# Patient Record
Sex: Female | Born: 1950
Health system: Southern US, Community
[De-identification: ages and names within clinical notes are randomized; demographics above are authoritative.]

## PROBLEM LIST (undated history)

## (undated) DIAGNOSIS — J309 Allergic rhinitis, unspecified: Secondary | ICD-10-CM

## (undated) DIAGNOSIS — F1210 Cannabis abuse, uncomplicated: Secondary | ICD-10-CM

## (undated) DIAGNOSIS — J069 Acute upper respiratory infection, unspecified: Secondary | ICD-10-CM

## (undated) DIAGNOSIS — K5730 Diverticulosis of large intestine without perforation or abscess without bleeding: Secondary | ICD-10-CM

## (undated) DIAGNOSIS — M1990 Unspecified osteoarthritis, unspecified site: Secondary | ICD-10-CM

## (undated) DIAGNOSIS — I10 Essential (primary) hypertension: Secondary | ICD-10-CM

## (undated) DIAGNOSIS — F5105 Insomnia due to other mental disorder: Secondary | ICD-10-CM

## (undated) DIAGNOSIS — F339 Major depressive disorder, recurrent, unspecified: Secondary | ICD-10-CM

## (undated) DIAGNOSIS — G5603 Carpal tunnel syndrome, bilateral upper limbs: Secondary | ICD-10-CM

## (undated) DIAGNOSIS — E559 Vitamin D deficiency, unspecified: Secondary | ICD-10-CM

## (undated) DIAGNOSIS — E785 Hyperlipidemia, unspecified: Secondary | ICD-10-CM

## (undated) DIAGNOSIS — F418 Other specified anxiety disorders: Secondary | ICD-10-CM

## (undated) DIAGNOSIS — Z720 Tobacco use: Secondary | ICD-10-CM

## (undated) HISTORY — DX: Major depressive disorder, recurrent, unspecified: F33.9

## (undated) HISTORY — DX: Cannabis abuse, uncomplicated: F12.10

## (undated) HISTORY — DX: Unspecified osteoarthritis, unspecified site: M19.90

## (undated) HISTORY — DX: Carpal tunnel syndrome, bilateral upper limbs: G56.03

## (undated) HISTORY — DX: Other specified anxiety disorders: F41.8

## (undated) HISTORY — DX: Essential (primary) hypertension: I10

## (undated) HISTORY — DX: Hyperlipidemia, unspecified: E78.5

## (undated) HISTORY — DX: Diverticulosis of large intestine without perforation or abscess without bleeding: K57.30

## (undated) HISTORY — DX: Tobacco use: Z72.0

## (undated) HISTORY — DX: Allergic rhinitis, unspecified: J30.9

## (undated) HISTORY — DX: Vitamin D deficiency, unspecified: E55.9

## (undated) HISTORY — DX: Insomnia due to other mental disorder: F51.05

---
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
## 1995-02-26 HISTORY — PX: ABDOMINAL HYSTERECTOMY: SHX81

## 2000-06-26 ENCOUNTER — Encounter: Admission: RE | Admit: 2000-06-26 | Discharge: 2000-06-26 | Payer: Self-pay | Admitting: Internal Medicine

## 2001-03-19 ENCOUNTER — Encounter: Admission: RE | Admit: 2001-03-19 | Discharge: 2001-03-19 | Payer: Self-pay | Admitting: Internal Medicine

## 2001-05-07 ENCOUNTER — Encounter: Admission: RE | Admit: 2001-05-07 | Discharge: 2001-05-07 | Payer: Self-pay | Admitting: Internal Medicine

## 2001-06-19 ENCOUNTER — Encounter: Admission: RE | Admit: 2001-06-19 | Discharge: 2001-06-19 | Payer: Self-pay | Admitting: Internal Medicine

## 2001-11-04 ENCOUNTER — Encounter: Admission: RE | Admit: 2001-11-04 | Discharge: 2001-11-04 | Payer: Self-pay | Admitting: Internal Medicine

## 2002-04-03 ENCOUNTER — Other Ambulatory Visit: Admission: RE | Admit: 2002-04-03 | Discharge: 2002-04-03 | Payer: Self-pay | Admitting: Obstetrics and Gynecology

## 2004-12-07 ENCOUNTER — Ambulatory Visit: Payer: Self-pay | Admitting: Family Medicine

## 2005-05-24 ENCOUNTER — Ambulatory Visit: Payer: Self-pay | Admitting: Family Medicine

## 2005-05-25 ENCOUNTER — Ambulatory Visit: Payer: Self-pay

## 2005-10-02 ENCOUNTER — Ambulatory Visit: Payer: Self-pay | Admitting: Family Medicine

## 2008-06-11 ENCOUNTER — Ambulatory Visit: Payer: Self-pay | Admitting: Internal Medicine

## 2008-06-11 LAB — CONVERTED CEMR LAB
ALT: 13 units/L (ref 0–35)
Calcium: 9.9 mg/dL (ref 8.4–10.5)
Chloride: 108 meq/L (ref 96–112)
Glucose, Bld: 98 mg/dL (ref 70–99)
HDL: 53 mg/dL (ref >39)
HIV-1 antibody: NEGATIVE
HIV-2 Ab: NEGATIVE
HIV: REACTIVE
LDL Cholesterol: 143 mg/dL — ABNORMAL HIGH (ref 0–99)
Total Bilirubin: 0.4 mg/dL (ref 0.3–1.2)
Total CHOL/HDL Ratio: 4.1
Triglycerides: 99 mg/dL (ref <150)
VLDL: 20 mg/dL (ref 0–40)

## 2008-06-13 ENCOUNTER — Ambulatory Visit: Payer: Self-pay | Admitting: Internal Medicine

## 2008-06-24 ENCOUNTER — Ambulatory Visit: Payer: Self-pay | Admitting: *Deleted

## 2008-06-24 ENCOUNTER — Ambulatory Visit (HOSPITAL_COMMUNITY): Admission: RE | Admit: 2008-06-24 | Discharge: 2008-06-24 | Payer: Self-pay | Admitting: Family Medicine

## 2008-09-14 ENCOUNTER — Encounter: Payer: Self-pay | Admitting: Family Medicine

## 2008-09-14 ENCOUNTER — Ambulatory Visit: Payer: Self-pay | Admitting: Internal Medicine

## 2008-09-14 LAB — CONVERTED CEMR LAB
Eosinophils Absolute: 0.1 10*3/uL (ref 0.0–0.7)
Eosinophils Relative: 2 % (ref 0–5)
HCT: 40.6 % (ref 36.0–46.0)
Hemoglobin: 13.6 g/dL (ref 12.0–15.0)
MCV: 88.3 fL (ref 78.0–100.0)
Neutro Abs: 2.8 10*3/uL (ref 1.7–7.7)
Neutrophils Relative %: 50 % (ref 43–77)
Platelets: 355 10*3/uL (ref 150–400)
RBC: 4.6 M/uL (ref 3.87–5.11)
Sed Rate: 10 mm/hr (ref 0–22)
Vitamin B-12: 310 pg/mL (ref 211–911)

## 2008-10-12 ENCOUNTER — Ambulatory Visit: Payer: Self-pay | Admitting: Family Medicine

## 2008-10-26 ENCOUNTER — Ambulatory Visit: Payer: Self-pay | Admitting: Family Medicine

## 2008-11-23 ENCOUNTER — Ambulatory Visit: Payer: Self-pay | Admitting: Family Medicine

## 2008-11-23 LAB — CONVERTED CEMR LAB
Albumin: 4.3 g/dL (ref 3.5–5.2)
CO2: 23 meq/L (ref 19–32)
Calcium: 9.3 mg/dL (ref 8.4–10.5)
Chloride: 109 meq/L (ref 96–112)
Cholesterol: 236 mg/dL — ABNORMAL HIGH (ref 0–200)
Glucose, Bld: 97 mg/dL (ref 70–99)
HDL: 55 mg/dL (ref >39)
Pro B Natriuretic peptide (BNP): 28.6 pg/mL (ref 0.0–100.0)
Total Bilirubin: 0.3 mg/dL (ref 0.3–1.2)
Triglycerides: 118 mg/dL (ref <150)
VLDL: 24 mg/dL (ref 0–40)

## 2008-12-04 ENCOUNTER — Ambulatory Visit: Payer: Self-pay | Admitting: Family Medicine

## 2008-12-16 ENCOUNTER — Ambulatory Visit: Payer: Self-pay | Admitting: Family Medicine

## 2008-12-30 ENCOUNTER — Ambulatory Visit: Payer: Self-pay | Admitting: Family Medicine

## 2009-01-06 ENCOUNTER — Ambulatory Visit: Payer: Self-pay | Admitting: Internal Medicine

## 2009-04-07 ENCOUNTER — Ambulatory Visit: Payer: Self-pay | Admitting: Internal Medicine

## 2009-06-24 ENCOUNTER — Ambulatory Visit: Payer: Self-pay | Admitting: Internal Medicine

## 2009-07-16 ENCOUNTER — Ambulatory Visit: Payer: Self-pay | Admitting: Internal Medicine

## 2009-08-20 ENCOUNTER — Ambulatory Visit: Payer: Self-pay | Admitting: Internal Medicine

## 2009-09-14 ENCOUNTER — Ambulatory Visit: Payer: Self-pay | Admitting: Internal Medicine

## 2009-09-14 DIAGNOSIS — F339 Major depressive disorder, recurrent, unspecified: Secondary | ICD-10-CM

## 2009-09-14 DIAGNOSIS — E785 Hyperlipidemia, unspecified: Secondary | ICD-10-CM

## 2009-09-14 DIAGNOSIS — I10 Essential (primary) hypertension: Secondary | ICD-10-CM

## 2009-09-14 DIAGNOSIS — F332 Major depressive disorder, recurrent severe without psychotic features: Secondary | ICD-10-CM | POA: Insufficient documentation

## 2009-09-14 HISTORY — DX: Essential (primary) hypertension: I10

## 2009-09-14 HISTORY — DX: Major depressive disorder, recurrent, unspecified: F33.9

## 2009-09-14 HISTORY — DX: Hyperlipidemia, unspecified: E78.5

## 2009-11-16 ENCOUNTER — Ambulatory Visit: Payer: Self-pay | Admitting: Internal Medicine

## 2009-11-23 ENCOUNTER — Ambulatory Visit: Payer: Self-pay | Admitting: Internal Medicine

## 2009-11-23 ENCOUNTER — Encounter (INDEPENDENT_AMBULATORY_CARE_PROVIDER_SITE_OTHER): Payer: Self-pay | Admitting: Internal Medicine

## 2009-12-15 ENCOUNTER — Telehealth (INDEPENDENT_AMBULATORY_CARE_PROVIDER_SITE_OTHER): Payer: Self-pay | Admitting: Internal Medicine

## 2009-12-15 ENCOUNTER — Ambulatory Visit: Payer: Self-pay | Admitting: Internal Medicine

## 2010-01-13 ENCOUNTER — Ambulatory Visit: Payer: Self-pay | Admitting: Internal Medicine

## 2010-01-13 DIAGNOSIS — IMO0001 Reserved for inherently not codable concepts without codable children: Secondary | ICD-10-CM

## 2010-01-29 LAB — CONVERTED CEMR LAB: Total CK: 189 units/L — ABNORMAL HIGH (ref 7–177)

## 2010-02-01 ENCOUNTER — Ambulatory Visit: Payer: Self-pay | Admitting: Internal Medicine

## 2010-02-04 ENCOUNTER — Telehealth (INDEPENDENT_AMBULATORY_CARE_PROVIDER_SITE_OTHER): Payer: Self-pay | Admitting: Internal Medicine

## 2010-02-15 ENCOUNTER — Ambulatory Visit: Payer: Self-pay | Admitting: Internal Medicine

## 2010-02-18 ENCOUNTER — Telehealth (INDEPENDENT_AMBULATORY_CARE_PROVIDER_SITE_OTHER): Payer: Self-pay | Admitting: Internal Medicine

## 2010-02-24 ENCOUNTER — Ambulatory Visit: Payer: Self-pay | Admitting: Internal Medicine

## 2010-03-01 ENCOUNTER — Ambulatory Visit: Payer: Self-pay | Admitting: Internal Medicine

## 2010-03-15 ENCOUNTER — Ambulatory Visit: Payer: Self-pay | Admitting: Internal Medicine

## 2010-03-28 ENCOUNTER — Ambulatory Visit: Payer: Self-pay | Admitting: Internal Medicine

## 2010-04-27 ENCOUNTER — Encounter (INDEPENDENT_AMBULATORY_CARE_PROVIDER_SITE_OTHER): Payer: Self-pay | Admitting: Internal Medicine

## 2010-04-27 ENCOUNTER — Encounter: Admission: RE | Admit: 2010-04-27 | Discharge: 2010-04-27 | Payer: Self-pay | Admitting: Internal Medicine

## 2010-06-01 ENCOUNTER — Ambulatory Visit: Payer: Self-pay | Admitting: Internal Medicine

## 2010-06-01 LAB — CONVERTED CEMR LAB
ALT: 14 units/L (ref 0–35)
AST: 24 units/L (ref 0–37)
Alkaline Phosphatase: 84 units/L (ref 39–117)
Bilirubin Urine: NEGATIVE
Blood in Urine, dipstick: NEGATIVE
Calcium: 9.9 mg/dL (ref 8.4–10.5)
Chloride: 106 meq/L (ref 96–112)
Cholesterol: 210 mg/dL — ABNORMAL HIGH (ref 0–200)
Creatinine, Ser: 0.62 mg/dL (ref 0.40–1.20)
Eosinophils Absolute: 0.1 10*3/uL (ref 0.0–0.7)
Glucose, Urine, Semiquant: NEGATIVE
HIV-1 antibody: NEGATIVE
HIV-2 Ab: NEGATIVE
Hemoglobin: 13.5 g/dL (ref 12.0–15.0)
Ketones, urine, test strip: NEGATIVE
Lymphs Abs: 2.7 10*3/uL (ref 0.7–4.0)
Neutro Abs: 4.9 10*3/uL (ref 1.7–7.7)
Neutrophils Relative %: 60 % (ref 43–77)
RBC: 4.6 M/uL (ref 3.87–5.11)
RDW: 14.3 % (ref 11.5–15.5)
Sodium: 141 meq/L (ref 135–145)
Specific Gravity, Urine: <1.005
Total Bilirubin: 0.3 mg/dL (ref 0.3–1.2)
Total CHOL/HDL Ratio: 4.5
Total Protein: 7 g/dL (ref 6.0–8.3)
Urobilinogen, UA: 0.2
WBC Urine, dipstick: NEGATIVE
WBC: 8.2 10*3/uL (ref 4.0–10.5)

## 2010-06-03 ENCOUNTER — Ambulatory Visit: Payer: Self-pay | Admitting: Internal Medicine

## 2010-06-03 ENCOUNTER — Telehealth (INDEPENDENT_AMBULATORY_CARE_PROVIDER_SITE_OTHER): Payer: Self-pay | Admitting: Internal Medicine

## 2010-06-07 ENCOUNTER — Ambulatory Visit (HOSPITAL_COMMUNITY)
Admission: RE | Admit: 2010-06-07 | Discharge: 2010-06-07 | Payer: Self-pay | Source: Home / Self Care | Admitting: Internal Medicine

## 2010-06-07 ENCOUNTER — Telehealth (INDEPENDENT_AMBULATORY_CARE_PROVIDER_SITE_OTHER): Payer: Self-pay | Admitting: Internal Medicine

## 2010-06-07 DIAGNOSIS — K5730 Diverticulosis of large intestine without perforation or abscess without bleeding: Secondary | ICD-10-CM

## 2010-06-07 HISTORY — DX: Diverticulosis of large intestine without perforation or abscess without bleeding: K57.30

## 2010-06-09 ENCOUNTER — Ambulatory Visit: Payer: Self-pay | Admitting: Internal Medicine

## 2010-06-09 ENCOUNTER — Encounter (INDEPENDENT_AMBULATORY_CARE_PROVIDER_SITE_OTHER): Payer: Self-pay | Admitting: *Deleted

## 2010-06-09 ENCOUNTER — Telehealth (INDEPENDENT_AMBULATORY_CARE_PROVIDER_SITE_OTHER): Payer: Self-pay | Admitting: Internal Medicine

## 2010-06-14 ENCOUNTER — Ambulatory Visit (HOSPITAL_COMMUNITY): Admission: RE | Admit: 2010-06-14 | Discharge: 2010-06-14 | Payer: Self-pay | Admitting: Internal Medicine

## 2010-06-17 ENCOUNTER — Ambulatory Visit: Payer: Self-pay | Admitting: Internal Medicine

## 2010-06-29 ENCOUNTER — Encounter (INDEPENDENT_AMBULATORY_CARE_PROVIDER_SITE_OTHER): Payer: Self-pay | Admitting: *Deleted

## 2010-07-04 ENCOUNTER — Ambulatory Visit: Payer: Self-pay | Admitting: Gastroenterology

## 2010-07-18 ENCOUNTER — Ambulatory Visit: Payer: Self-pay | Admitting: Gastroenterology

## 2010-07-20 ENCOUNTER — Encounter (INDEPENDENT_AMBULATORY_CARE_PROVIDER_SITE_OTHER): Payer: Self-pay | Admitting: Internal Medicine

## 2010-07-20 ENCOUNTER — Encounter: Admission: RE | Admit: 2010-07-20 | Discharge: 2010-08-26 | Payer: Self-pay | Admitting: Internal Medicine

## 2010-10-18 ENCOUNTER — Ambulatory Visit: Payer: Self-pay | Admitting: Internal Medicine

## 2010-10-18 DIAGNOSIS — G4700 Insomnia, unspecified: Secondary | ICD-10-CM

## 2010-10-18 DIAGNOSIS — R32 Unspecified urinary incontinence: Secondary | ICD-10-CM

## 2010-10-18 DIAGNOSIS — G5603 Carpal tunnel syndrome, bilateral upper limbs: Secondary | ICD-10-CM

## 2010-10-18 HISTORY — DX: Carpal tunnel syndrome, bilateral upper limbs: G56.03

## 2010-10-27 DEATH — deceased

## 2010-11-01 ENCOUNTER — Ambulatory Visit: Payer: Self-pay | Admitting: Internal Medicine

## 2010-11-01 LAB — CONVERTED CEMR LAB: VLDL: 20 mg/dL (ref 0–40)

## 2010-11-16 ENCOUNTER — Encounter (INDEPENDENT_AMBULATORY_CARE_PROVIDER_SITE_OTHER): Payer: Self-pay | Admitting: Internal Medicine

## 2010-12-29 NOTE — Progress Notes (Signed)
  Phone Note Outgoing Call   Call placed by: Julieanne Manson MD,  June 07, 2010 2:15 PM Summary of Call: Called pt. and let her know about her HIV positive screen with negative confirmatory testing.  Also discussed CT of abdomen and pelvis as no concerning findings other than diverticulosis and to eat a high fiber diet.  Pt. would like to have repeat HIV testing in 6 months when she returns--discussed we could do that, but that she is not currently positive for HIV. Initial call taken by: Julieanne Manson MD,  June 07, 2010 2:17 PM  New Problems: DIVERTICULITIS OF COLON (ICD-562.11)   New Problems: DIVERTICULITIS OF COLON (ICD-562.11)

## 2010-12-29 NOTE — Assessment & Plan Note (Signed)
Summary: FU//////KT   Allergies: 1)  ! Penicillin   Complete Medication List: 1)  Neurontin 300 Mg Caps (Gabapentin) .... 2 caps by mouth two times a day 2)  Niaspan 500 Mg Cr-tabs (Niacin (antihyperlipidemic)) .Marland Kitchen.. 1 tab by mouth at bedtime for 2 weeks, then increase to 2 tabs at bedtime and remain on that dose 3)  Aspir-low 81 Mg Tbec (Aspirin) .Marland Kitchen.. 1 by mouth once daily 4)  Trazodone Hcl 50 Mg Tabs (Trazodone hcl) .Marland Kitchen.. 1 by mouth at bedtime 5)  Oyster Shell Calcium/d 500-125 Mg-unit Tabs (Calcium-vitamin d) .Marland Kitchen.. 1 by mouth once daily 6)  Amlodipine Besylate 5 Mg Tabs (Amlodipine besylate) .Marland Kitchen.. 1 tab by mouth daily 7)  Zoloft 50 Mg Tabs (Sertraline hcl) .... 1/2 tab by mouth daily for 7 days, then 1 tab daily thereafter.  Patient Instructions: 1)  Discussed pt. with Aquilla Solian, not getting out of bed or showering for a week.  Possibly some homicidal thoughts.  To see me next week.  Will start on Zoloft. Prescriptions: ZOLOFT 50 MG TABS (SERTRALINE HCL) 1/2 tab by mouth daily for 7 days, then 1 tab daily thereafter.  #30 x 2   Entered and Authorized by:   Julieanne Manson MD   Signed by:   Julieanne Manson MD on 02/01/2010   Method used:   Faxed to ...       [facility] - Pharmac (retail)       [address] [place], [place]  [address]       Ph: [phone]       Fax: [phone]   RxID:   [id number]

## 2010-12-29 NOTE — Letter (Signed)
Summary: TEST ORDER FORM//CT//APPT DATE & TIME  TEST ORDER FORM//CT//APPT DATE & TIME   Imported By: Arta Bruce 06/03/2010 10:43:21  _____________________________________________________________________  External Attachment:    Type:   Image     Comment:   External Document

## 2010-12-29 NOTE — Letter (Signed)
Summary: NUTRITION & DIABETES MANAGEMENT  NUTRITION & DIABETES MANAGEMENT   Imported By: Arta Bruce 06/10/2010 10:18:25  _____________________________________________________________________  External Attachment:    Type:   Image     Comment:   External Document

## 2010-12-29 NOTE — Progress Notes (Signed)
Summary: Requesting the provider call her back  Phone Note Call from Patient   Summary of Call: The pt have done some radiology test and she is wondered if the provider can call her back at her convenience.  Pt perfectly understand that the physican is really very busy so whenever she can call is fine. Emmarie Sannes MD Initial call taken by: Manon Hilding,  June 09, 2010 4:38 PM  Follow-up for Phone Call        pt requesting radiology results. Follow-up by: Vesta Mixer CMA,  June 09, 2010 5:09 PM  Additional Follow-up for Phone Call Additional follow up Details #1::        I called and gave her the results on Tuesday--does she not recall the discussion of diverticulosis?  Please notify again--nothing concerning Additional Follow-up by: Julieanne Manson MD,  June 09, 2010 6:17 PM    Additional Follow-up for Phone Call Additional follow up Details #2::    Spoke with pt. and explained diverticulosis for her -- had some questions.  Understands that it's nothing serious at this point and to follow-up with the colonoscopy appt. when set up. Follow-up by: Dutch Quint RN,  June 13, 2010 4:02 PM

## 2010-12-29 NOTE — Assessment & Plan Note (Signed)
Summary: pain on left side of body, problem, with her foot,bed wetting...   Vital Signs:  Patient profile:   60 year old female Weight:      121.31 pounds BMI:     22.27 Temp:     97.9 degrees F oral Pulse rate:   78 / minute Pulse rhythm:   regular Resp:     20 per minute BP sitting:   140 / 78  (left arm) Cuff size:   regular  Vitals Entered By: Hale Drone CMA (October 18, 2010 11:08 AM) CC: Pt. is complaining of pain on the left side of the body. Also, has been incotinence ever since she started working which was back in 08/11 at [employer].  Is Patient Diabetic? No Pain Assessment Patient in pain? no       Does patient need assistance? Functional Status Self care Ambulation Normal   CC:  Pt. is complaining of pain on the left side of the body. Also and has been incotinence ever since she started working which was back in 08/11 at [employer]. 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  1.  Left sided pain:  sting in lower right arm with numbness into palm--comes and goes.  Also has pain in left lateral/posterior calf muscle discomfort--also feels like the muscle cramps at times.  Both areas of pain occur at same time.  Generally when on her feet for a prolonged period of time.  If lifts up leg and takes weight off it, both areas of pain improved.    2.  Insomnia:  Neighbors on both sides of her in apt. building often slam doors and awakens her at night.  Generally a problem on the weekends as teenage kids have parties on the weekends.  Pt. has spoken with apt. manager and the teen's parents.    3.  Anxiety /depression:  with job stress, increased Zoloft to 100 mg on her own.  Not clear the increased dose has helped.  Later, evident that she actually ran out of the Zoloft about 1 week ago as she doubled up on the dose.  4.  Urinary incontinence:  complaint noted at end of visit  Current  Medications (verified): 1)  Neurontin 300 Mg Caps (Gabapentin) .... 2 Caps By Mouth Two Times A Day 2)  Aspir-Low 81 Mg Tbec (Aspirin) .Marland Kitchen.. 1 By Mouth Once Daily 3)  Trazodone Hcl 50 Mg Tabs (Trazodone Hcl) .Marland Kitchen.. 1 By Mouth At Bedtime 4)  Oyster Shell Calcium/d 500-125 Mg-Unit Tabs (Calcium-Vitamin D) .Marland Kitchen.. 1 By Mouth Once Daily 5)  Amlodipine Besylate 5 Mg Tabs (Amlodipine Besylate) .Marland Kitchen.. 1 Tab By Mouth Daily 6)  Zoloft 50 Mg Tabs (Sertraline Hcl) .Marland Kitchen.. 1 Tab By Mouth Daily  Allergies (verified): 1)  ! Penicillin  Physical Exam  Lungs:  Normal respiratory effort, chest expands symmetrically. Lungs are clear to auscultation, no crackles or wheezes. Heart:  Normal rate and regular rhythm. S1 and S2 normal without gallop, murmur, click, rub or other extra sounds. Extremities:  Tender over achilles tendon and calf muscles on left.  No redness or swelling. Neurologic:  Positive Tinel's over median nerve at left wrist.   Impression & Recommendations:  Problem # 1:  CARPAL TUNNEL SYNDROME, LEFT (ICD-354.0) Cock up splint--none available for left wrist in clinic today To obtain and wear when sleeping.  Problem # 2:  HYPERCHOLESTEROLEMIA (ICD-272.0) Nonfasting today.  Has been working with nutrition to improve this--schedule for fasting labs  Problem # 3:  LEFT ACHILLES TENDINITIS (ICD-726.71) Discussed stretches  Problem # 4:  DEPRESSION (ICD-311) To increase Zoloft to 100 mg Encouraged her not to make changes to meds on own--do not want her running out of meds. Her updated medication list for this problem includes:    Trazodone Hcl 50 Mg Tabs (Trazodone hcl) .Marland Kitchen... 1 by mouth at bedtime.  may repeat 1/4 to 1/2 tab by mouth if before 5 a.m. and unable to return to sleep    Zoloft 50 Mg Tabs (Sertraline hcl) .Marland Kitchen... 2  tabs by mouth daily  Problem # 5:  INSOMNIA (ICD-780.52) May take 1/4 to 1/2 dose of Trazadone extra if unable to get back to sleep--only if before 5 a.m.  Complete  Medication List: 1)  Neurontin 300 Mg Caps (Gabapentin) .... 2 caps by mouth two times a day 2)  Aspir-low 81 Mg Tbec (Aspirin) .Marland Kitchen.. 1 by mouth once daily 3)  Trazodone Hcl 50 Mg Tabs (Trazodone hcl) .Marland Kitchen.. 1 by mouth at bedtime.  may repeat 1/4 to 1/2 tab by mouth if before 5 a.m. and unable to return to sleep 4)  Oyster Shell Calcium/d 500-125 Mg-unit Tabs (Calcium-vitamin d) .Marland Kitchen.. 1 by mouth once daily 5)  Amlodipine Besylate 5 Mg Tabs (Amlodipine besylate) .Marland Kitchen.. 1 tab by mouth daily 6)  Zoloft 50 Mg Tabs (Sertraline hcl) .... 2  tabs by mouth daily  Other Orders: UA Dipstick w/o Micro (manual) (27253)  Patient Instructions: 1)  May repeat Trazadone--1/4 to 1/2 tab before 5 a.m. if awakened and cannot get back to sleep 2)  Follow up with Dr. Delrae Alfred in 3 months  3)  Cock up splint for left hand--wear to bed nightly 4)  Stretches as discussed 20 counts for 20 reps twice daily 5)  Schedule fasting lab appt. for FLP in next 2 weeks. Prescriptions: ZOLOFT 50 MG TABS (SERTRALINE HCL) 2  tabs by mouth daily  #60 x 11   Entered and Authorized by:   Julieanne Manson MD   Signed by:   Julieanne Manson MD on 10/18/2010   Method used:   Faxed to ...       [facility] - Pharmac (retail)       [address] [place], [place]  [address]       Ph: [phone]       Fax: [phone]   RxID:   [id number] TRAZODONE HCL 50 MG TABS (TRAZODONE HCL) 1 by mouth at bedtime.  May repeat 1/4 to 1/2 tab by mouth if before 5 a.m. and unable to return to sleep  #45 x 11   Entered and Authorized by:   Julieanne Manson MD   Signed by:   Julieanne Manson MD on 10/18/2010   Method used:   Faxed to ...       [facility] - Pharmac (retail)       [address] [place], [place]  [address]       Ph: [phone]       Fax: [phone]   RxID:   [id number]    Orders Added: 1)  Est. Patient Level IV [94854] 2)  UA Dipstick  w/o Micro (manual) [81002]   Immunization History:  Influenza Immunization History:    Influenza:  refused (10/18/2010)  Not Administered:    Influenza Vaccine not given due  to: declined   Immunization History:  Influenza Immunization History:    Influenza:  refused (10/18/2010)     Appended Document: UA      Allergies: 1)  ! Penicillin   Complete Medication List: 1)  Neurontin 300 Mg Caps (Gabapentin) .... 2 caps by mouth two times a day 2)  Aspir-low 81 Mg Tbec (Aspirin) .Marland Kitchen.. 1 by mouth once daily 3)  Trazodone Hcl 50 Mg Tabs (Trazodone hcl) .Marland Kitchen.. 1 by mouth at bedtime.  may repeat 1/4 to 1/2 tab by mouth if before 5 a.m. and unable to return to sleep 4)  Oyster Shell Calcium/d 500-125 Mg-unit Tabs (Calcium-vitamin d) .Marland Kitchen.. 1 by mouth once daily 5)  Amlodipine Besylate 5 Mg Tabs (Amlodipine besylate) .Marland Kitchen.. 1 tab by mouth daily 6)  Zoloft 50 Mg Tabs (Sertraline hcl) .... 2  tabs by mouth daily      Laboratory Results   Urine Tests  Date/Time Received: October 18, 2010 12:27 PM   Routine Urinalysis   Color: yellow Glucose: negative   (Normal Range: Negative) Bilirubin: negative   (Normal Range: Negative) Ketone: negative   (Normal Range: Negative) Spec. Gravity: 1.010   (Normal Range: 1.003-1.035) Blood: negative   (Normal Range: Negative) pH: 6.5   (Normal Range: 5.0-8.0) Protein: negative   (Normal Range: Negative) Urobilinogen: 0.2   (Normal Range: 0-1) Nitrite: negative   (Normal Range: Negative) Leukocyte Esterace: negative   (Normal Range: Negative)

## 2010-12-29 NOTE — Letter (Signed)
Summary: NUTRITION & DIABETES/PROGRESS NOTE  NUTRITION & DIABETES/PROGRESS NOTE   Imported By: Arta Bruce 07/27/2010 11:15:29  _____________________________________________________________________  External Attachment:    Type:   Image     Comment:   External Document

## 2010-12-29 NOTE — Assessment & Plan Note (Signed)
Summary: CPP EXAM//GK   Vital Signs:  Patient profile:   60 year old female Weight:      131 pounds Temp:     98.0 degrees F Pulse rate:   80 / minute Pulse rhythm:   regular Resp:     18 per minute BP sitting:   121 / 76  (left arm) Cuff size:   regular  Vitals Entered By: Vesta Mixer CMA (June 03, 2010 9:02 AM) CC: CPP Is Patient Diabetic? No Pain Assessment Patient in pain? no       Does patient need assistance? Ambulation Normal   CC:  CPP.  History of Present Illness: 60 yo female here for CPE.  1.  Hyperlipidemia:  working with Seward Grater May, Nutrition.  Has lost weight and cholesterol is improved without meds.  2.  HIV testing not complete--discussed need to wait on test result and to call if does not hear from me next week.  Rest of testing for STDs negative.  Habits & Providers  Alcohol-Tobacco-Diet     Alcohol drinks/day: 0, rare--used to drink a fair amt.     Tobacco Status: current     Cigarette Packs/Day: <0.25     Year Started: age 53  Exercise-Depression-Behavior     Drug Use: never  Comments: No history of blood transfusion.  Allergies (verified): 1)  ! Penicillin  Past History:  Past Medical History: Reviewed history from 09/14/2009 and no changes required. DEPRESSION (ICD-311) HYPERLIPIDEMIA (ICD-272.4) UNSPECIFIED VITAMIN D DEFICIENCY (ICD-268.9) NEUROPATHY, IDIOPATHIC PERIPHERAL (ICD-356.9) ESSENTIAL HYPERTENSION (ICD-401.9)  Past Surgical History: Reviewed history from 09/14/2009 and no changes required. 1.  02/1995:  Hysterectomy--fibroid tumors--Ovaries left  Family History: Mother, 50:  Hx of breast cancer--age 77.  Depression. Hypertension Father, died 50s:  Pt. suspects alcohol related.   Both Grandmothers with DM. 4 Brothers, 1 died in MVA 4 Sisters, 1 died as a child with leukemia.   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 No significant other--last partner in 2002  Review of Systems General:  Energy--up and down.. Eyes:  Blurry at times--uses reading glasses.. ENT:  Denies decreased hearing. CV:  Denies chest pain or discomfort and shortness of breath with exertion. Resp:  Denies shortness of breath. GI:  Denies abdominal pain, bloody stools, and dark tarry stools; on iron with MV--sometimes stool darker with that. GU:  Denies discharge, dysuria, and urinary frequency. MS:  Denies joint pain, joint redness, and joint swelling. Derm:  Denies rash. Neuro:  numbness in left hand and tips of fingers. Tingling on left lateral foot.  Neurontin has helped. Psych:  Scored 6 on PHQ-9, but pt. feels like she is doing well on current dose of Zoloft..  Physical Exam  General:  Well-developed,well-nourished,in no acute distress; alert,appropriate and cooperative throughout examination Head:  Normocephalic and atraumatic without obvious abnormalities. Eyes:  No corneal or conjunctival inflammation noted. EOMI. Perrla. Funduscopic exam benign, without hemorrhages, exudates or papilledema. Vision grossly normal. Ears:  External ear exam shows no significant lesions or deformities.  Otoscopic examination reveals clear canals, tympanic membranes are intact bilaterally without bulging, retraction, inflammation or discharge. Hearing is grossly normal bilaterally. Nose:  External nasal examination shows no deformity or inflammation. Nasal mucosa are pink and moist without lesions or exudates. Mouth:  Oral mucosa and oropharynx without lesions or exudates.  Teeth in good repair.fair dentition and teeth  missing.  Partials Neck:  No deformities, masses, or tenderness noted. Breasts:  No mass, nodules, thickening, tenderness, bulging, retraction, inflamation, nipple discharge or skin changes noted.   Lungs:  Normal respiratory effort, chest expands symmetrically. Lungs are clear to auscultation, no crackles or  wheezes. Heart:  Normal rate and regular rhythm. S1 and S2 normal without gallop, murmur, click, rub or other extra sounds. Abdomen:  Bowel sounds positive,abdomen soft and non-tender without masses, except for some tenderness in LLQ, no organomegaly or hernias noted. Rectal:  No external abnormalities noted. Normal sphincter tone. No rectal masses or tenderness, even on palpationg to pt's left.  Black heme negative stool Genitalia:  normal introitus, no external lesions, no vaginal discharge, and mucosa pink and moist.  No adnexal mass, but quite tender in left adnexal area.  No pap Msk:  No deformity or scoliosis noted of thoracic or lumbar spine.   Pulses:  R and L carotid,radial,femoral,dorsalis pedis and posterior tibial pulses are full and equal bilaterally Extremities:  No clubbing, cyanosis, edema, or deformity noted with normal full range of motion of all joints.   Neurologic:  No cranial nerve deficits noted. Station and gait are normal. Plantar reflexes are down-going bilaterally. DTRs are symmetrical throughout. Sensory, motor and coordinative functions appear intact. Skin:  Intact without suspicious lesions or rashes.  Flying elephant tattoo over right shoulder blade Cervical Nodes:  No lymphadenopathy noted Axillary Nodes:  No palpable lymphadenopathy Inguinal Nodes:  No significant adenopathy Psych:  Cognition and judgment appear intact. Alert and cooperative with normal attention span and concentration. No apparent delusions, illusions, hallucinations   Impression & Recommendations:  Problem # 1:  ROUTINE GYNECOLOGICAL EXAMINATION (ICD-V72.31) Mammogram scheduled STD work up negative, though preliminary for HIV reactive and confirmatory test pending. Only risk factor thus far seems to be possibly her tattoo.  Problem # 2:  HYPERLIPIDEMIA (ICD-272.4) Improved with lifestyle changes--encouraged exercise The following medications were removed from the medication list:     Niaspan 500 Mg Cr-tabs (Niacin (antihyperlipidemic)) .Marland Kitchen... 1 tab by mouth at bedtime for 2 weeks, then increase to 2 tabs at bedtime and remain on that dose  Problem # 3:  ESSENTIAL HYPERTENSION (ICD-401.9) controlled Her updated medication list for this problem includes:    Amlodipine Besylate 5 Mg Tabs (Amlodipine besylate) .Marland Kitchen... 1 tab by mouth daily  Problem # 4:  ABDOMINAL PAIN, LEFT LOWER QUADRANT (ICD-789.04)  Orders: CT with Contrast (CT w/ contrast)  Complete Medication List: 1)  Neurontin 300 Mg Caps (Gabapentin) .... 2 caps by mouth two times a day 2)  Aspir-low 81 Mg Tbec (Aspirin) .Marland Kitchen.. 1 by mouth once daily 3)  Trazodone Hcl 50 Mg Tabs (Trazodone hcl) .Marland Kitchen.. 1 by mouth at bedtime 4)  Oyster Shell Calcium/d 500-125 Mg-unit Tabs (Calcium-vitamin d) .Marland Kitchen.. 1 by mouth once daily 5)  Amlodipine Besylate 5 Mg Tabs (Amlodipine besylate) .Marland Kitchen.. 1 tab by mouth daily 6)  Zoloft 50 Mg Tabs (Sertraline hcl) .Marland Kitchen.. 1 tab by mouth daily  Other Orders: Nutrition Referral (Nutrition) Gastroenterology Referral (GI)  Patient Instructions: 1)  Bring in stool cards, completed in 2 weeks--ask to get Tdap then--hopefully, we'll have 2)  Follow up with Dr. Delrae Alfred in 6 months --cholesterol and htn  Preventive Care Screening     Last Mammogram:  cannot recall--sometime in last 2 years.  Has had at [facility] and [facility]. SBE:  Generally once monthly in shower--no changes Pap:  S/P hysterectomy. Colonoscopy:  never Guaiac Cards:  years ago--negative Osteoprevention:  Drinks one serving of Soy milk daily.  Feels she has lactose intolerance.  Not much physical exercise Immunizations:  more than 10 years for last Td.  Prescriptions: ZOLOFT 50 MG TABS (SERTRALINE HCL) 1 tab by mouth daily  #30 x 11   Entered and Authorized by:   Julieanne Manson MD   Signed by:   Julieanne Manson MD on 06/03/2010   Method used:   Faxed to ...       [facility] - Pharmac (retail)        [address] [place], [place]  [address]       Ph: [phone]       Fax: [phone]   RxID:   [id number] AMLODIPINE BESYLATE 5 MG TABS (AMLODIPINE BESYLATE) 1 tab by mouth daily  #30 x 11   Entered and Authorized by:   Julieanne Manson MD   Signed by:   Julieanne Manson MD on 06/03/2010   Method used:   Faxed to ...       [facility] - Pharmac (retail)       [address] [place], [place]  [address]       Ph: [phone]       Fax: [phone]   RxID:   [id number] OYSTER SHELL CALCIUM/D 500-125 MG-UNIT TABS (CALCIUM-VITAMIN D) 1 by mouth once daily  #30 x 11   Entered and Authorized by:   Julieanne Manson MD   Signed by:   Julieanne Manson MD on 06/03/2010   Method used:   Faxed to ...       [facility] - [facility] (retail)       [address] [place], [place]  [address]       Ph: [phone]       Fax: [phone]   RxID:   [id number] TRAZODONE HCL 50 MG TABS (TRAZODONE HCL) 1 by mouth at bedtime  #30 x 11   Entered and Authorized by:   Julieanne Manson MD   Signed by:   Julieanne Manson MD on 06/03/2010   Method used:   Faxed to ...       [facility] - Pharmac (retail)       [address] [place], [place]  [address]       Ph: [phone]       Fax: [phone]   RxID:   [id number] ASPIR-LOW 81 MG TBEC (ASPIRIN) 1 by mouth once daily  #30 x 11   Entered and Authorized by:   Julieanne Manson MD   Signed by:   Julieanne Manson MD on 06/03/2010   Method used:   Faxed to ...       [facility] - Pharmac (retail)       [address] [place], [place]  [address]       Ph: [phone]       Fax: [phone]   RxID:   [id number] NEURONTIN 300 MG CAPS (GABAPENTIN) 2 caps by mouth two times a day  #120 x 11   Entered and Authorized by:   Julieanne Manson MD   Signed by:    Julieanne Manson MD on 06/03/2010   Method used:   Faxed to ...       [facility] - Pharmac (retail)  [address] [place], [place]  [address]       Ph: [phone]       Fax: [phone]   RxID:   [id number]      Appended Document: CPP EXAM//GK  Laboratory Results    Stool - Occult Blood Hemmoccult #1: negative Date: 06/21/2010 Hemoccult #2: negative Date: 06/21/2010 Hemoccult #3: negative Date: 06/21/2010

## 2010-12-29 NOTE — Progress Notes (Signed)
Summary: ***medication reaction***  Phone Note Call from Patient Call back at Home Phone [phone]   Summary of Call: pt called and stated she started using the aspirin before taking the niaspan and thought it was doing better. But pt states she is having nausea, no appetite, headaches and breaking out in rashes. (left side face and left arm) pt states that taking the aspririn did help her to not feel on fire. Pt wants provider to call back. Pt took niaspan last night at 12p. Initial call taken by: Mikey College CMA,  February 18, 2010 11:03 AM  Follow-up for Phone Call        Pt. had already held Niaspan since last Wednesday when had rash.   She has a lot going on right now--will hold until I see her next week. Follow-up by: Julieanne Manson MD,  February 20, 2010 3:37 PM

## 2010-12-29 NOTE — Procedures (Signed)
Summary: Colonoscopy  Patient: Mia Underwood Note: All result statuses are Final unless otherwise noted.  Tests: (1) Colonoscopy (COL)   COL Colonoscopy           DONE     [facility]     [address]     [place], [place]  [address]           COLONOSCOPY PROCEDURE REPORT           PATIENT:  [name], [name]  MR#:  [id number]     BIRTHDATE:  Jan 25, 1951, 58 yrs. old  GENDER:  female     ENDOSCOPIST:  Vania Rea. Jarold Motto, MD, [facility]     REF. BY:  Julieanne Manson, M.D.     PROCEDURE DATE:  07/18/2010     PROCEDURE:  Surveillance Colonoscopy     ASA CLASS:  Class II     INDICATIONS:  Routine Risk Screening     MEDICATIONS:   Fentanyl 50 mcg IV, Versed 7 mg IV           DESCRIPTION OF PROCEDURE:   After the risks benefits and     alternatives of the procedure were thoroughly explained, informed     consent was obtained.  Digital rectal exam was performed and     revealed no abnormalities.   The LB [id number] endoscope     was introduced through the anus and advanced to the cecum, which     was identified by both the appendix and ileocecal valve, without     limitations.  The quality of the prep was excellent, using     MoviPrep.  The instrument was then slowly withdrawn as the colon     was fully examined.     <<PROCEDUREIMAGES>>           FINDINGS:  Mild diverticulosis was found in the sigmoid colon.  No     polyps or cancers were seen.  This was otherwise a normal     examination of the colon.   Retroflexed views in the rectum     revealed no abnormalities.    The scope was then withdrawn from     the patient and the procedure completed.           COMPLICATIONS:  None     ENDOSCOPIC IMPRESSION:     1) Mild diverticulosis in the sigmoid colon     2) No polyps or cancers     3) Otherwise normal examination     RECOMMENDATIONS:     1) high fiber diet     2) Continue current colorectal screening recommendations for     "routine risk" patients with a repeat  colonoscopy in 10 years.     REPEAT EXAM:  No           ______________________________     Vania Rea. Jarold Motto, MD, [facility]           CC:           n.     eSIGNED:   Vania Rea. Patterson at 07/18/2010 11:21 AM           Blanchard Kelch, [id number]  Note: An exclamation mark (!) indicates a result that was not dispersed into the flowsheet. Document Creation Date: 07/18/2010 11:21 AM _______________________________________________________________________  (1) Order result status: Final Collection or observation date-time: 07/18/2010 11:11 Requested date-time:  Receipt date-time:  Reported date-time:  Referring Physician:   Ordering Physician: Sheryn Bison [phone]) Specimen Source:  Source: Launa Grill Order Number: [id number] Lab site:   Appended Document: Colonoscopy    Clinical Lists Changes  Observations: Added new observation of COLONNXTDUE: 06/2020 (07/18/2010 13:41)

## 2010-12-29 NOTE — Letter (Signed)
Summary: Lipid Letter  [facility]  [address] [place], [place] [address]   Phone: [phone]  Fax: [phone]    11/16/2010  Dasiah Hooley [address] [place], [place]  [address]  Dear Mia Underwood:  We have carefully reviewed your last lipid profile from 11/01/2010 and the results are noted below with a summary of recommendations for lipid management.    Cholesterol:       233     Goal: <200   HDL "good" Cholesterol:   46     Goal: >45   LDL "bad" Cholesterol:   167     Goal: <130   Triglycerides:       100     Goal: <150    Your total and LDL cholesterol actually went up.  HDL and Triglycerides are a bit better.  The LDL level is most important in decreasing risk for heart disease and strokes.  Really need to work on diet and exercise.  Please call and schedule a recheck of fasting cholesterol panel in 4 months    TLC Diet (Therapeutic Lifestyle Change): Saturated Fats & Transfatty acids should be kept < 7% of total calories ***Reduce Saturated Fats Polyunstaurated Fat can be up to 10% of total calories Monounsaturated Fat Fat can be up to 20% of total calories Total Fat should be no greater than 25-35% of total calories Carbohydrates should be 50-60% of total calories Protein should be approximately 15% of total calories Fiber should be at least 20-30 grams a day ***Increased fiber may help lower LDL Total Cholesterol should be < 200mg /day Consider adding plant stanol/sterols to diet (example: Benacol spread) ***A higher intake of unsaturated fat may reduce Triglycerides and Increase HDL    Adjunctive Measures (may lower LIPIDS and reduce risk of Heart Attack) include: Aerobic Exercise (20-30 minutes 3-4 times a week) Limit Alcohol Consumption Weight Reduction Aspirin 75-81 mg a day by mouth (if not allergic or contraindicated) Dietary Fiber 20-30 grams a day by mouth     Current Medications: 1)    Neurontin 300 Mg Caps  (Gabapentin) .... 2 caps by mouth two times a day 2)    Aspir-low 81 Mg Tbec (Aspirin) .Marland Kitchen.. 1 by mouth once daily 3)    Trazodone Hcl 50 Mg Tabs (Trazodone hcl) .Marland Kitchen.. 1 by mouth at bedtime.  may repeat 1/4 to 1/2 tab by mouth if before 5 a.m. and unable to return to sleep 4)    Oyster Shell Calcium/d 500-125 Mg-unit Tabs (Calcium-vitamin d) .Marland Kitchen.. 1 by mouth once daily 5)    Amlodipine Besylate 5 Mg Tabs (Amlodipine besylate) .Marland Kitchen.. 1 tab by mouth daily 6)    Zoloft 50 Mg Tabs (Sertraline hcl) .... 2  tabs by mouth daily  If you have any questions, please call. We appreciate being able to work with you.   Sincerely,    [facility] Julieanne Manson MD

## 2010-12-29 NOTE — Assessment & Plan Note (Signed)
Summary: MEDS CAUSING MUSCLES TO BE VERY STIFF IN THE MORNNG//SS   Vital Signs:  Patient profile:   60 year old female Height:      62 inches Weight:      138 pounds BMI:     25.33 Temp:     98.1 degrees F Pulse rate:   56 / minute Pulse rhythm:   regular Resp:     18 per minute BP sitting:   151 / 85  (left arm) Cuff size:   regular  Vitals Entered By: Vesta Mixer CMA (January 13, 2010 10:34 AM) CC: feeling really stiff all over since January Is Patient Diabetic? No Pain Assessment Patient in pain? yes     Location: shoulder/back/legs  Does patient need assistance? Ambulation Normal   CC:  feeling really stiff all over since January.  History of Present Illness: 1.  Psychiatric:  Has seen Alinda Money referral elsewhere yet, but pt. planning to continue with Amanda--missed appt. recently, but needs to reschedule.  2.  Muscle stiffness:  left neck and down shoulder:  Problem since end of 11/2009.  No definite injury.  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  Has tried Advil and Aleve--some help.  Feels worse when cold in the morning.  Wears sweats to bed and long johns during the day as all of joints feel stiff when cold.  Not so much trouble during the summer unless in [facility].  Pt. started Lipitor for first time in October of 2010.  Pt. has had the joint discomfort since 2004.  Has had the left shoulder pain before, but not as significant as this.   3.   Burning sensation in upper lateral left chest--points to lateral edge of pectoralis muscle and radiates to left shoulder blade.  Started about 01/04/10.  Took Alka seltzer and drank Ginger ale.  Seemed to really help with symptoms.  Belching also seems to relieve the pain.  Did not have have any burning into throat or bad taste in mouth.  No definite worsening with eating.  Has had previously, but not as frequent.  Discomfort is intermittent and occurs twice weekly.  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  Lasts 15-30 minutes.  Pt. is a very poor historian.  Starts giving other symptoms, but cannot say whether other symptoms are related to this chest discomfort or not.  No nausea or vomiting associated.  No melena or hematochezia.  Continues to smoke 3 cigarettes daily.  Has hypertension, and hyperlipidemia, no history of heart disease in family that pt. aware of.  Current Medications (verified): 1)  Neurontin 300 Mg Caps (Gabapentin) .... 2 Caps By Mouth Two Times A Day 2)  Lipitor 10 Mg Tabs (Atorvastatin Calcium) .Marland Kitchen.. 1 By Mouth Once Daily 3)  Aspir-Low 81 Mg Tbec (Aspirin) .Marland Kitchen.. 1 By Mouth Once Daily 4)  Trazodone Hcl 50 Mg Tabs (Trazodone Hcl) .Marland Kitchen.. 1 By Mouth At Bedtime 5)  Oyster Shell Calcium/d 500-125 Mg-Unit Tabs (Calcium-Vitamin D) .Marland Kitchen.. 1 By Mouth Once Daily 6)  Amlodipine Besylate 5 Mg Tabs (Amlodipine Besylate) .Marland Kitchen.. 1 Tab By Mouth Daily  Allergies (verified): 1)  ! Penicillin  Physical Exam  General:  NAD Chest Wall:  Jumps almost before any palpation, but appears very tender on palpation of pectoralis lateral edge and across upper left chest as palpate medially.  Also quite tender over both heads of left trap, medial to scapula and  to shoulder. Lungs:  Normal respiratory effort, chest expands symmetrically. Lungs are clear to auscultation, no crackles or wheezes. Heart:  Normal rate and regular rhythm. S1 and S2 normal without gallop, murmur, click, rub or other extra sounds.  Radial pulses normal and equal   Impression & Recommendations:  Problem # 1:  MUSCLE PAIN (ICD-729.1) Hold on Lipitor for now, but to keep med. If still with pain in 1 month, refer to PT, if not, restart Lipitor and see if pain recurs--if does, will stop Lipitor and try another cholesterol lowering agent.  If CPK high, stop Lipitor and try another as well. Her updated medication list for this problem includes:    Aspir-low 81 Mg Tbec (Aspirin) .Marland Kitchen... 1 by  mouth once daily  Orders: [facility] [phone])  Problem # 2:  DEPRESSION (ICD-311) Back to Aqu[facility] Solian Her updated medication list for this problem includes:    Trazodone Hcl 50 Mg Tabs (Trazodone hcl) .Marland Kitchen... 1 by mouth at bedtime  Complete Medication List: 1)  Neurontin 300 Mg Caps (Gabapentin) .... 2 caps by mouth two times a day 2)  Lipitor 10 Mg Tabs (Atorvastatin calcium) .Marland Kitchen.. 1 by mouth once daily 3)  Aspir-low 81 Mg Tbec (Aspirin) .Marland Kitchen.. 1 by mouth once daily 4)  Trazodone Hcl 50 Mg Tabs (Trazodone hcl) .Marland Kitchen.. 1 by mouth at bedtime 5)  Oyster Shell Calcium/d 500-125 Mg-unit Tabs (Calcium-vitamin d) .Marland Kitchen.. 1 by mouth once daily 6)  Amlodipine Besylate 5 Mg Tabs (Amlodipine besylate) .Marland Kitchen.. 1 tab by mouth daily  Patient Instructions: 1)  Reschedule recently missed appt. with Aquilla Solian please. 2)  Need to obtain Vitamin D 400 International Units and take 2 tabs daily. 3)  Follow up with Dr. Delrae Alfred in 1 month

## 2010-12-29 NOTE — Progress Notes (Signed)
Summary: ***MEDICATION REACTION*** PLEASE FOLLOW UP TODAY!!!!  Phone Note Call from Patient Call back at Home Phone [phone]   Summary of Call: TOOK HER OFF LIPITOR AND WAS PUT ON NIASPAN. PT STATES SHE PICKED UP RX TUESDAY AND STARTED TAKING RX WEDNESDAY. PT STATES YESTERDAY SHE TOOK NIASPAN AND IT MADE HER FEEL LIKE SHE WAS ON FIRE AND SHE BEGAN TO ITCH (THURSDAY NIGHT) RED WELPS ON BOTH ARMS AND BACK OF NECK AND ACROSS STOMACH AND CHEST. PT STATES THAT TIFFANY TOLD HER TO CALL IF SHE HAS A REACTION NOT TO TAKE IT. PT WANTS TO KNOW WHAT DOES SHE WANT HER TO DO AT THIS TIME. SHE STATES SHE IS TAKING 500MG .Marland KitchenMarland KitchenPATIENT DOESNT TAKE MEDS UNTIL MIDNIGHT SO HAS NOT TAKING MEDS FOR TODAY YET. Initial call taken by: Mikey College CMA,  February 04, 2010 12:05 PM  Follow-up for Phone Call        per Dr Delrae Alfred pt needs to take an asa 1/2 to an hour prior to taking medication to reduce the flushing..... left message to return call.... Follow-up by: Mikey College CMA,  February 04, 2010 4:49 PM  Additional Follow-up for Phone Call Additional follow up Details #1::        patient aware to take an asprin before niaspan. Additional Follow-up by: Leodis Rains,  February 07, 2010 8:42 AM

## 2010-12-29 NOTE — Letter (Signed)
Summary: Previsit letter  [facility]  [address] [place], [place] [address]   Phone: [phone]  Fax: [phone]       06/09/2010 MRN: [id number]  [name] [address] [place], [place]  [address]  Dear [name],  Welcome to the Gastroenterology Division at [facility].    You are scheduled to see a nurse for your pre-procedure visit on 07-04-10 at 9:00a.m. on the 3rd floor at [facility], [address]  We ask that you try to arrive at our office 15 minutes prior to your appointment time to allow for check-in.  Your nurse visit will consist of discussing your medical and surgical history, your immediate family medical history, and your medications.    Please bring a complete list of all your medications or, if you prefer, bring the medication bottles and we will list them.  We will need to be aware of both prescribed and over the counter drugs.  We will need to know exact dosage information as well.  If you are on blood thinners (Coumadin, Plavix, Aggrenox, Ticlid, etc.) please call our office today/prior to your appointment, as we need to consult with your physician about holding your medication.   Please be prepared to read and sign documents such as consent forms, a financial agreement, and acknowledgement forms.  If necessary, and with your consent, a friend or relative is welcome to sit-in on the nurse visit with you.  Please bring your insurance card so that we may make a copy of it.  If your insurance requires a referral to see a specialist, please bring your referral form from your primary care physician.  No co-pay is required for this nurse visit.     If you cannot keep your appointment, please call [phone] to cancel or reschedule prior to your appointment date.  This allows [place] the opportunity to schedule an appointment for another patient in need of care.    Thank you for choosing [facility] for your medical needs.   We appreciate the opportunity to care for you.  Please visit [place] at our website  to learn more about our practice.                     Sincerely.                                                                                                                   The Gastroenterology Division

## 2010-12-29 NOTE — Miscellaneous (Signed)
Summary: previsit prep/RM  Clinical Lists Changes  Medications: Added new medication of MOVIPREP 100 GM  SOLR (PEG-KCL-NACL-NASULF-NA ASC-C) As per prep instructions. - Signed Rx of MOVIPREP 100 GM  SOLR (PEG-KCL-NACL-NASULF-NA ASC-C) As per prep instructions.;  #1 x 0;  Signed;  Entered by: Sherren Kerns RN;  Authorized by: Mardella Layman MD [facility];  Method used: Faxed to [facility] [address], [place], [place]  [address], Ph: [phone], Fax: [phone] Observations: Added new observation of ALLERGY REV: Done (07/04/2010 8:28)    Prescriptions: MOVIPREP 100 GM  SOLR (PEG-KCL-NACL-NASULF-NA ASC-C) As per prep instructions.  #1 x 0   Entered by:   Sherren Kerns RN   Authorized by:   Mardella Layman MD [facility]   Signed by:   Sherren Kerns RN on 07/04/2010   Method used:   Faxed to ...       [facility] (retail)       [address] [place], [place]  [address]       Ph: [phone]       Fax: [phone]   RxID:   [id number]   Appended Document: previsit prep/RM    Clinical Lists Changes     Pharmacy that patient uses does not have movi prep., instructed patient to come by office to pick up sample of movi prep, she states that she would come by tomorrow morning at 8:30 am.  Sherren Kerns RN  July 04, 2010 12:59 PM

## 2010-12-29 NOTE — Progress Notes (Signed)
Summary: GI update  Phone Note Outgoing Call   Call placed by: Julieanne Manson MD,  June 03, 2010 4:48 PM Summary of Call: Debra:  GI referral for screening colonoscopy.  Send CPP please Initial call taken by: Julieanne Manson MD,  June 03, 2010 4:48 PM  Follow-up for Phone Call        Can Pt wait until Oct?There is people on the waiting list before her Follow-up by: Candi Leash,  June 06, 2010 4:32 PM  Additional Follow-up for Phone Call Additional follow up Details #1::        That would be fine Additional Follow-up by: Julieanne Manson MD,  June 07, 2010 2:11 PM    Additional Follow-up for Phone Call Additional follow up Details #2::    Pt is sched @ [facility] for 07-04-10 Follow-up by: Candi Leash,  June 09, 2010 3:19 PM

## 2010-12-29 NOTE — Letter (Signed)
Summary: [facility] Instructions  [facility]  [address] [place], [place] [address]   Phone: [phone]  Fax: [phone]       Mia Underwood    [birth year]    MRN: [id number]        Procedure Day /Date:  Monday 07/18/2010     Arrival Time: 10:30 am      Procedure Time: 11:30 am     Location of Procedure:                    _x _  [facility] (4th Floor)                        PREPARATION FOR COLONOSCOPY WITH MOVIPREP   Starting 5 days prior to your procedure Wednesday 8/17 do not eat nuts, seeds, popcorn, corn, beans, peas,  salads, or any raw vegetables.  Do not take any fiber supplements (e.g. Metamucil, Citrucel, and Benefiber).  THE DAY BEFORE YOUR PROCEDURE         DATE: Sunday 8/21  1.  Drink clear liquids the entire day-NO SOLID FOOD  2.  Do not drink anything colored red or purple.  Avoid juices with pulp.  No orange juice.  3.  Drink at least 64 oz. (8 glasses) of fluid/clear liquids during the day to prevent dehydration and help the prep work efficiently.  CLEAR LIQUIDS INCLUDE: Water Jello Ice Popsicles Tea (sugar ok, no milk/cream) Powdered fruit flavored drinks Coffee (sugar ok, no milk/cream) Gatorade Juice: apple, white grape, white cranberry  Lemonade Clear bullion, consomm, broth Carbonated beverages (any kind) Strained chicken noodle soup Hard Candy                             4.  In the morning, mix first dose of MoviPrep solution:    Empty 1 Pouch A and 1 Pouch B into the disposable container    Add lukewarm drinking water to the top line of the container. Mix to dissolve    Refrigerate (mixed solution should be used within 24 hrs)  5.  Begin drinking the prep at 5:00 p.m. The MoviPrep container is divided by 4 marks.   Every 15 minutes drink the solution down to the next mark (approximately 8 oz) until the full liter is complete.   6.  Follow completed prep with 16 oz of clear liquid of your choice (Nothing  red or purple).  Continue to drink clear liquids until bedtime.  7.  Before going to bed, mix second dose of MoviPrep solution:    Empty 1 Pouch A and 1 Pouch B into the disposable container    Add lukewarm drinking water to the top line of the container. Mix to dissolve    Refrigerate  THE DAY OF YOUR PROCEDURE      DATE: Monday 8/22  Beginning at 6:30 a.m. (5 hours before procedure):         1. Every 15 minutes, drink the solution down to the next mark (approx 8 oz) until the full liter is complete.  2. Follow completed prep with 16 oz. of clear liquid of your choice.    3. You may drink clear liquids until 9:30 am (2 HOURS BEFORE PROCEDURE).   MEDICATION INSTRUCTIONS  Unless otherwise instructed, you should take regular prescription medications with a small sip of water   as early as possible the morning of  your procedure.   Additional medication instructions: n/a         OTHER INSTRUCTIONS  You will need a responsible adult at least 59 years of age to accompany you and drive you home.   This person must remain in the waiting room during your procedure.  Wear loose fitting clothing that is easily removed.  Leave jewelry and other valuables at home.  However, you may wish to bring a book to read or  an iPod/MP3 player to listen to music as you wait for your procedure to start.  Remove all body piercing jewelry and leave at home.  Total time from sign-in until discharge is approximately 2-3 hours.  You should go home directly after your procedure and rest.  You can resume normal activities the  day after your procedure.  The day of your procedure you should not:   Drive   Make legal decisions   Operate machinery   Drink alcohol   Return to work  You will receive specific instructions about eating, activities and medications before you leave.    The above instructions have been reviewed and explained to me by   Sherren Kerns RN  July 04, 2010 9:31  AM    I fully understand and can verbalize these instructions _____________________________ Date _________

## 2010-12-29 NOTE — Progress Notes (Signed)
Summary: DO SHE STILL TAKE HER VITAMINS  Phone Note Call from Patient Call back at Home Phone [phone]   Reason for Call: Lab or Test Results Summary of Call: Mia Underwood PATIENT. MS Blas WANTS TO KNOW IF YOU STILL WANT HER TO CONTINUE ON THE VITAMINS, AND IF SO, THEY NEED TO CALLED INTO [facility]. BECAUSE SHE HAS NO MORE REFILLS. Initial call taken by: Leodis Rains,  December 15, 2009 9:20 AM  Follow-up for Phone Call        Pt wondering does she still need to take Vit D. Follow-up by: Vesta Mixer CMA,  December 15, 2009 11:38 AM  Additional Follow-up for Phone Call Additional follow up Details #1::        She should get Vitamin D over the counter now--her level is now good, but she needs to maintain with a lower dose.  400 International Units daily Additional Follow-up by: Julieanne Manson MD,  December 17, 2009 5:37 PM    Additional Follow-up for Phone Call Additional follow up Details #2::    Left message on answering machine for pt to return call at [phone]. Follow-up by: Vesta Mixer CMA,  December 20, 2009 9:42 AM  Additional Follow-up for Phone Call Additional follow up Details #3:: Details for Additional Follow-up Action Taken: Left message on answering machine for pt to return call at same number........... Tiffany McCoy CMA  December 22, 2009 12:00 PM   left message to return call.Marland KitchenMarland KitchenMarland KitchenMikey College CMA  December 23, 2009 4:31 PM   Pt aware..................... Vesta Mixer CMA  December 24, 2009 12:33 PM

## 2010-12-29 NOTE — Letter (Signed)
Summary: AMANDA'S SUMMARY  AMANDA'S SUMMARY   Imported By: Arta Bruce 04/21/2010 12:28:40  _____________________________________________________________________  External Attachment:    Type:   Image     Comment:   External Document

## 2010-12-29 NOTE — Progress Notes (Signed)
Summary: Office Visit//DEPRESSION SCREENING  Office Visit//DEPRESSION SCREENING   Imported By: Arta Bruce 06/06/2010 09:08:32  _____________________________________________________________________  External Attachment:    Type:   Image     Comment:   External Document

## 2010-12-29 NOTE — Assessment & Plan Note (Signed)
Summary: 4 month on htn/depression/cts///kt   Vital Signs:  Patient profile:   60 year old female Weight:      138.38 pounds Temp:     98.3 degrees F Pulse rate:   74 / minute Pulse rhythm:   regular Resp:     20 per minute BP sitting:   137 / 70  (left arm) Cuff size:   regular  Vitals Entered By: Chauncy Passy, SMA CC: Pt. is here for a f/u on HTN, depression, CTS. Pt. states the meds for her depression is working well and keeps her less stressful. Pt. is complaining of aches in joints and CTS is still bothering her. Pt. also states she had a rash on the left side of her face and inside of her left arm on 02/16/10 which is gone now. Also,she felt nauseas and had loss of appetite followed by diarrhea that lasted for 2 days. Is Patient Diabetic? No Pain Assessment Patient in pain? yes     Location: left arm Intensity: 5 Type: dull Onset of pain  Constant  Does patient need assistance? Functional Status Self care Ambulation Normal   CC:  Pt. is here for a f/u on HTN, depression, CTS. Pt. states the meds for her depression is working well and keeps her less stressful. Pt. is complaining of aches in joints and CTS is still bothering her. Pt. also states she had a rash on the left side of her face and inside of her left arm on 02/16/10 which is gone now. Also, and she felt nauseas and had loss of appetite followed by diarrhea that lasted for 2 days.Marland Kitchen  History of Present Illness: 1.  Depression with concern for homicidal ideation when seeing Aquilla Solian on 3/8:  doing much better since starting--has been taking for about 3 weeks now.  Up to 50 mg.  Sounds like work is better.  Not so worried about how she is being treated as previously.  Calmer at work, where she has most of her interaction with other people.  Apparently, other coworkers coming to her on how to handle stress with work.  Sleeps well.  Awakens with good energy.  Looks forward to day most of time.  No suicidal thought  before or now.   2.  Hypercholesterolemia:   Switched from Lipitor to Niaspan secondary to mild elevation in CPK with muscle pain while taking the former.  Had "whelps"  and hot flushing with Niaspan.  Better when took and aspirin prior.  Later, developed another rash on face and left arm, but was not aware of it until a coworker called it to her attention.  Pt. also with nausea and lack of appetite, and eventually with diarrhea--not clear improved with discontinuation of med and pt. did have some of this prior to starting med.  Eats one meal daily, but then drinks a lot of fruit juice during the day.      Current Medications (verified): 1)  Neurontin 300 Mg Caps (Gabapentin) .... 2 Caps By Mouth Two Times A Day 2)  Niaspan 500 Mg Cr-Tabs (Niacin (Antihyperlipidemic)) .Marland Kitchen.. 1 Tab By Mouth At Bedtime For 2 Weeks, Then Increase To 2 Tabs At Bedtime and Remain On That Dose 3)  Aspir-Low 81 Mg Tbec (Aspirin) .Marland Kitchen.. 1 By Mouth Once Daily 4)  Trazodone Hcl 50 Mg Tabs (Trazodone Hcl) .Marland Kitchen.. 1 By Mouth At Bedtime 5)  Oyster Shell Calcium/d 500-125 Mg-Unit Tabs (Calcium-Vitamin D) .Marland Kitchen.. 1 By Mouth Once Daily 6)  Amlodipine  Besylate 5 Mg Tabs (Amlodipine Besylate) .Marland Kitchen.. 1 Tab By Mouth Daily 7)  Zoloft 50 Mg Tabs (Sertraline Hcl) .... 1/2 Tab By Mouth Daily For 7 Days, Then 1 Tab Daily Thereafter.  Allergies (verified): 1)  ! Penicillin  Physical Exam  Lungs:  Normal respiratory effort, chest expands symmetrically. Lungs are clear to auscultation, no crackles or wheezes. Heart:  Normal rate and regular rhythm. S1 and S2 normal without gallop, murmur, click, rub or other extra sounds.   Impression & Recommendations:  Problem # 1:  HYPERCHOLESTEROLEMIA (ICD-272.0) Stop all cholesterol lowering meds. See what her baseline cholesterol is in 4 months after going to Nutrition and working on diet and exercise Her updated medication list for this problem includes:    Niaspan 500 Mg Cr-tabs (Niacin  (antihyperlipidemic)) .Marland Kitchen... 1 tab by mouth at bedtime for 2 weeks, then increase to 2 tabs at bedtime and remain on that dose  Problem # 2:  DEPRESSION (ICD-311) Continue meds--doing much better. Her updated medication list for this problem includes:    Trazodone Hcl 50 Mg Tabs (Trazodone hcl) .Marland Kitchen... 1 by mouth at bedtime    Zoloft 50 Mg Tabs (Sertraline hcl) .Marland Kitchen... 1 tab by mouth daily  Complete Medication List: 1)  Neurontin 300 Mg Caps (Gabapentin) .... 2 caps by mouth two times a day 2)  Niaspan 500 Mg Cr-tabs (Niacin (antihyperlipidemic)) .Marland Kitchen.. 1 tab by mouth at bedtime for 2 weeks, then increase to 2 tabs at bedtime and remain on that dose 3)  Aspir-low 81 Mg Tbec (Aspirin) .Marland Kitchen.. 1 by mouth once daily 4)  Trazodone Hcl 50 Mg Tabs (Trazodone hcl) .Marland Kitchen.. 1 by mouth at bedtime 5)  Oyster Shell Calcium/d 500-125 Mg-unit Tabs (Calcium-vitamin d) .Marland Kitchen.. 1 by mouth once daily 6)  Amlodipine Besylate 5 Mg Tabs (Amlodipine besylate) .Marland Kitchen.. 1 tab by mouth daily 7)  Zoloft 50 Mg Tabs (Sertraline hcl) .Marland Kitchen.. 1 tab by mouth daily  Other Orders: Nutrition Referral (Nutrition)  Patient Instructions: 1)  Stop cholesterol medication completely 2)  CPP with Dr. Delrae Alfred in 4 months 3)  Fasting labs to be scheduled 2 days BEFORE CPP:  FLP, CBC, CMET, HIV, RPR, urine GC and Chlamydia, UA.  Will not need pap, just pelvic exam at CPP Prescriptions: ZOLOFT 50 MG TABS (SERTRALINE HCL) 1 tab by mouth daily  #30 x 11   Entered and Authorized by:   Julieanne Manson MD   Signed by:   Julieanne Manson MD on 02/24/2010   Method used:   Faxed to ...       [facility] (retail)       [address] [place], [place]  [address]       Ph: [phone]       Fax: [phone]   RxID:   [id number]

## 2011-01-05 ENCOUNTER — Encounter (INDEPENDENT_AMBULATORY_CARE_PROVIDER_SITE_OTHER): Payer: Self-pay | Admitting: Internal Medicine

## 2011-01-19 ENCOUNTER — Encounter (INDEPENDENT_AMBULATORY_CARE_PROVIDER_SITE_OTHER): Payer: Self-pay | Admitting: Internal Medicine

## 2011-01-19 ENCOUNTER — Encounter: Payer: Self-pay | Admitting: Internal Medicine

## 2011-01-24 NOTE — Assessment & Plan Note (Signed)
Summary: f/u    Vital Signs:  Patient profile:   60 year old female Weight:      114.50 pounds Temp:     97.7 degrees F oral Pulse rate:   66 / minute Pulse rhythm:   regular Resp:     20 per minute BP sitting:   130 / 78  (left arm) Cuff size:   regular  Vitals Entered By: Hale Drone CMA (January 19, 2011 10:56 AM) CC: 3 month f/u from 10/18/10 -- still having left hand pain. Not getting any better. Finger tips still get numb. Also having left foot pain. Has had 3 incidents of bed wetting. Having difficulty remembering things.  Is Patient Diabetic? No Pain Assessment Patient in pain? yes       Does patient need assistance? Functional Status Self care Ambulation Normal   CC:  3 month f/u from 10/18/10 -- still having left hand pain. Not getting any better. Finger tips still get numb. Also having left foot pain. Has had 3 incidents of bed wetting. Having difficulty remembering things. .  History of Present Illness: 1.  Left Carpal Tunnel Syndrome:  Still with similar symptoms.  Wears at night.  Wears only 4 times weekly.  Dropping things.  Not taking any NSAIDS.    2. Insomnia/Depression/anxiety:  Was able to get an apt in a quieter area of apt. complex.  Sleeping better.  Feeling much better with increase of Zoloft.  When problems arise at home or work, able to keep going without being irritated.  Thinks she may be forgetting things--forgets where she puts things.  Does feel like she has a lot on her mind.  No one else has mentioned a problem with memory.  3. Urinary incontinence:  Has had 3 episodes of bedwetting since 06/2010.  Last episode in December  before changed apts., when still wasn't sleeping well.   Coffee only in morning.  Not clear if the days preceding she drank a lot of fluids.  Thinks she was very tired the days preceding the bedwetting.  Does not think she took and extra dose of Trazadone the days she wet.    4.  Stinging pain, lateral left ankle.  Has been a  chronic problem.  Bothering her more now, especially when on feet a lot at work.  Current Medications (verified): 1)  Neurontin 300 Mg Caps (Gabapentin) .... 2 Caps By Mouth Two Times A Day 2)  Aspir-Low 81 Mg Tbec (Aspirin) .Marland Kitchen.. 1 By Mouth Once Daily 3)  Trazodone Hcl 50 Mg Tabs (Trazodone Hcl) .Marland Kitchen.. 1 By Mouth At Bedtime.  May Repeat 1/4 To 1/2 Tab By Mouth If Before 5 A.m. and Unable To Return To Sleep 4)  Oyster Shell Calcium/d 500-125 Mg-Unit Tabs (Calcium-Vitamin D) .Marland Kitchen.. 1 By Mouth Once Daily 5)  Amlodipine Besylate 5 Mg Tabs (Amlodipine Besylate) .Marland Kitchen.. 1 Tab By Mouth Daily 6)  Zoloft 50 Mg Tabs (Sertraline Hcl) .... 2  Tabs By Mouth Daily  Allergies (verified): 1)  ! Penicillin  Physical Exam  General:  NAD Lungs:  Normal respiratory effort, chest expands symmetrically. Lungs are clear to auscultation, no crackles or wheezes. Heart:  Normal rate and regular rhythm. S1 and S2 normal without gallop, murmur, click, rub or other extra sounds. Extremities:  No swelling or erythema of left ankle or lower leg.  NT over foot and ankle.  Really tender over lateral gastroc/soleus complex. Neurologic:  Positive Tinel's over left median nerve at volar wrist  Pt.  able to identify 11 animals in 1 minute--spent at least 15 seconds telling me how difficult it was to think   Impression & Recommendations:  Problem # 1:  URINARY INCONTINENCE (ICD-788.30) Has not been a problem for a while and seems related to nights after had not slept in some time.   Has not recurred since sleeping better in new surroudings--follow for now.  Problem # 2:  INSOMNIA (ICD-780.52) Much improved  Problem # 3:  DEPRESSION (ICD-311) Improved Her updated medication list for this problem includes:    Trazodone Hcl 50 Mg Tabs (Trazodone hcl) .Marland Kitchen... 1 tab by mouth at bedtime.  may repeat 1/4 to 1/2 tab by mouth if before 5 a.m. and unable to return to sleep    Zoloft 50 Mg Tabs (Sertraline hcl) .Marland Kitchen... 2  tabs by mouth  daily  Problem # 4:  LEFT ACHILLES TENDINITIS (ICD-726.71) More gastroc strain--but to perform stretching exercise daily--discussed Consider PT in future when feels financially able to do.  Problem # 5:  CARPAL TUNNEL SYNDROME, LEFT (ICD-354.0) To wear splint every night Start Naproxen for regular use x 2 weeks, then as needed PT if does not resolve  Problem # 6:  CONCERN FOR MEMORY LOSS (ICD-780.93) Suspect related to having too much on mind/anxiety--follow for now Pt. 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  Complete Medication List: 1)  Neurontin 300 Mg Caps (Gabapentin) .... 2 caps by mouth two times a day 2)  Aspir-low 81 Mg Tbec (Aspirin) .Marland Kitchen.. 1 by mouth once daily 3)  Trazodone Hcl 50 Mg Tabs (Trazodone hcl) .Marland Kitchen.. 1 tab by mouth at bedtime.  may repeat 1/4 to 1/2 tab by mouth if before 5 a.m. and unable to return to sleep 4)  Oyster Shell Calcium/d 500-125 Mg-unit Tabs (Calcium-vitamin d) .Marland Kitchen.. 1 by mouth once daily 5)  Amlodipine Besylate 5 Mg Tabs (Amlodipine besylate) .Marland Kitchen.. 1 tab by mouth daily 6)  Zoloft 50 Mg Tabs (Sertraline hcl) .... 2  tabs by mouth daily 7)  Naproxen 500 Mg Tabs (Naproxen) .Marland Kitchen.. 1 tab by mouth two times a day with meals --for 2 weeks, then as needed for left hand pain  Patient Instructions: 1)  CPP with Dr. Delrae Alfred in late July Prescriptions: NAPROXEN 500 MG TABS (NAPROXEN) 1 tab by mouth two times a day with meals --for 2 weeks, then as needed for left hand pain  #60 x 1   Entered and Authorized by:   Julieanne Manson MD   Signed by:   Julieanne Manson MD on 01/19/2011   Method used:   Faxed to ...       [facility] (retail)       [address] [place], [place]  [address]       Ph: [phone]       Fax: [phone]   RxID:   [id number]    Orders Added: 1)  Est. Patient Level IV [40102]

## 2011-02-02 NOTE — Medication Information (Signed)
Summary: RX Folder//CONNECTION TO CARE  RX Folder//CONNECTION TO CARE   Imported By: Arta Bruce 01/26/2011 14:43:57  _____________________________________________________________________  External Attachment:    Type:   Image     Comment:   External Document

## 2014-07-14 ENCOUNTER — Ambulatory Visit (INDEPENDENT_AMBULATORY_CARE_PROVIDER_SITE_OTHER): Payer: No Typology Code available for payment source | Admitting: Family Medicine

## 2014-07-14 VITALS — BP 142/80 | HR 78 | Temp 98.3°F | Resp 18 | Ht 62.5 in | Wt 139.0 lb

## 2014-07-14 DIAGNOSIS — J3489 Other specified disorders of nose and nasal sinuses: Secondary | ICD-10-CM

## 2014-07-14 DIAGNOSIS — R0981 Nasal congestion: Secondary | ICD-10-CM

## 2014-07-14 DIAGNOSIS — L259 Unspecified contact dermatitis, unspecified cause: Secondary | ICD-10-CM

## 2014-07-14 DIAGNOSIS — R21 Rash and other nonspecific skin eruption: Secondary | ICD-10-CM

## 2014-07-14 MED ORDER — RANITIDINE HCL 150 MG PO TABS: 150.0000 mg | ORAL_TABLET | Freq: Two times a day (BID) | ORAL | Status: DC

## 2014-07-14 MED ORDER — FLUTICASONE PROPIONATE 50 MCG/ACT NA SUSP: 2.0000 | Freq: Every day | NASAL | Status: DC

## 2014-07-14 MED ORDER — HYDROCORTISONE 1 % EX LOTN: 1.0000 "application " | TOPICAL_LOTION | Freq: Two times a day (BID) | CUTANEOUS | Status: DC

## 2014-07-14 MED ORDER — CETIRIZINE HCL 10 MG PO TABS: 10.0000 mg | ORAL_TABLET | Freq: Every day | ORAL | Status: DC

## 2014-07-14 NOTE — Patient Instructions (Signed)
Use the cetirizine, Flonase, and ranitidine every night before bed for 2 weeks.  You can also use the ranitidine during the morning if you have any itching. Apply the lotion as needed during the day. Hot showers or breathing in steam may help loosen the congestion.  Using a netti pot or sinus rinse is also likely to help you feel better and keep this from progressing.  Use the fluticasone nasal spray every night before bed for at least 2 weeks.  I recommend augmenting with generic mucinex to help you move out the congestion.  If no improvement or you are getting worse, come back as you might need a course of steroids or antibiotics but hopefully with all of the above, you can avoid it.  Contact Dermatitis Contact dermatitis is a reaction to certain substances that touch the skin. Contact dermatitis can be either irritant contact dermatitis or allergic contact dermatitis. Irritant contact dermatitis does not require previous exposure to the substance for a reaction to occur.Allergic contact dermatitis only occurs if you have been exposed to the substance before. Upon a repeat exposure, your body reacts to the substance.  CAUSES  Many substances can cause contact dermatitis. Irritant dermatitis is most commonly caused by repeated exposure to mildly irritating substances, such as:  Makeup.  Soaps.  Detergents.  Bleaches.  Acids.  Metal salts, such as nickel. Allergic contact dermatitis is most commonly caused by exposure to:  Poisonous plants.  Chemicals (deodorants, shampoos).  Jewelry.  Latex.  Neomycin in triple antibiotic cream.  Preservatives in products, including clothing. SYMPTOMS  The area of skin that is exposed may develop:  Dryness or flaking.  Redness.  Cracks.  Itching.  Pain or a burning sensation.  Blisters. With allergic contact dermatitis, there may also be swelling in areas such as the eyelids, mouth, or genitals.  DIAGNOSIS  Your caregiver can usually  tell what the problem is by doing a physical exam. In cases where the cause is uncertain and an allergic contact dermatitis is suspected, a patch skin test may be performed to help determine the cause of your dermatitis. TREATMENT Treatment includes protecting the skin from further contact with the irritating substance by avoiding that substance if possible. Barrier creams, powders, and gloves may be helpful. Your caregiver may also recommend:  Steroid creams or ointments applied 2 times daily. For best results, soak the rash area in cool water for 20 minutes. Then apply the medicine. Cover the area with a plastic wrap. You can store the steroid cream in the refrigerator for a "chilly" effect on your rash. That may decrease itching. Oral steroid medicines may be needed in more severe cases.  Antibiotics or antibacterial ointments if a skin infection is present.  Antihistamine lotion or an antihistamine taken by mouth to ease itching.  Lubricants to keep moisture in your skin.  Burow's solution to reduce redness and soreness or to dry a weeping rash. Mix one packet or tablet of solution in 2 cups cool water. Dip a clean washcloth in the mixture, wring it out a bit, and put it on the affected area. Leave the cloth in place for 30 minutes. Do this as often as possible throughout the day.  Taking several cornstarch or baking soda baths daily if the area is too large to cover with a washcloth. Harsh chemicals, such as alkalis or acids, can cause skin damage that is like a burn. You should flush your skin for 15 to 20 minutes with cold water after  such an exposure. You should also seek immediate medical care after exposure. Bandages (dressings), antibiotics, and pain medicine may be needed for severely irritated skin.  HOME CARE INSTRUCTIONS  Avoid the substance that caused your reaction.  Keep the area of skin that is affected away from hot water, soap, sunlight, chemicals, acidic substances, or  anything else that would irritate your skin.  Do not scratch the rash. Scratching may cause the rash to become infected.  You may take cool baths to help stop the itching.  Only take over-the-counter or prescription medicines as directed by your caregiver.  See your caregiver for follow-up care as directed to make sure your skin is healing properly. SEEK MEDICAL CARE IF:   Your condition is not better after 3 days of treatment.  You seem to be getting worse.  You see signs of infection such as swelling, tenderness, redness, soreness, or warmth in the affected area.  You have any problems related to your medicines. Document Released: 11/10/2000 Document Revised: 02/05/2012 Document Reviewed: 04/18/2011 [facility] Patient Information 2015 [place], [place]. This information is not intended to replace advice given to you by your health care provider. Make sure you discuss any questions you have with your health care provider.

## 2014-07-14 NOTE — Progress Notes (Signed)
Mia Underwood is a 63 y.o. female who presents today for rash.  Pt states that this has been ongoing now for about 5 days.  She recently switched her detergent from ALL to on sale brand.  Rash started popping up on her neck w/ 3 small areas that then spread to her back, chest, abdomen, and is now starting to dissipate distally to her lower extremities.  She denies any fever, chills, sweats, mucous membrane involvement, recent travel or bug bites, new medications, soap or shampoo changes, or deodorant changes.  She is endorsing some HA and nausea that began around that time but has been able to eat.  Denies abdominal pain, lower extremity edema, fatigue, blurred vision, Hx of migraines.     Past Medical History  Diagnosis Date  . Allergy   . Anxiety   . Arthritis   . Hypertension   . Neuromuscular disorder     History  Smoking status  . Current Every Day Smoker  Smokeless tobacco  . Not on file    Family History  Problem Relation Age of Onset  . Cancer Mother     No current outpatient prescriptions on file prior to visit.   No current facility-administered medications on file prior to visit.    ROS: Per HPI.  All other systems reviewed and are negative.   Physical Exam Filed Vitals:   07/14/14 0834  BP: 142/80  Pulse: 78  Temp: 98.3 F (36.8 C)  Resp: 18    Physical Examination: General appearance - alert, well appearing, and in no distress Mental status - alert, oriented to person, place, and time Eyes - pupils equal and reactive, extraocular eye movements intact Ears - bilateral TM's and external ear canals normal Nose - mucosal erythema Mouth - mucous membranes moist, pharynx normal without lesions Neck - supple, no significant adenopathy Lymphatics - no palpable lymphadenopathy Chest - clear to auscultation, no wheezes, rales or rhonchi, symmetric air entry Heart - normal rate, regular rhythm, normal S1, S2, no murmurs, rubs, clicks or gallops Abdomen - soft,  nontender, nondistended, no masses or organomegaly Skin - Diffuse papular rash more pronounced on the back > chest/abdomen/thighs

## 2014-07-14 NOTE — Assessment & Plan Note (Signed)
Most likely contact dermatitis 2/2 recent changes in detergents.  Will tx with Zyrtec for anti-pruritis and eucerin:hydrocortisone cream PRN.  If no improvement, will need to f/u in the following week.

## 2014-07-26 NOTE — Progress Notes (Signed)
Subjective:    Patient ID: Mia Underwood, female    DOB: 1951/02/24, 63 y.o.   MRN: [id number] Chief Complaint  Patient presents with  . Rash    all over very itchy noticed 3 bumps on neck friday    HPI   See below note from Dr. Awanda Mink, PGY3  Past Medical History  Diagnosis Date  . Allergy   . Anxiety   . Arthritis   . Hypertension   . Neuromuscular disorder    No current outpatient prescriptions on file prior to visit.   No current facility-administered medications on file prior to visit.   Allergies  Allergen Reactions  . Penicillins      Review of Systems  Constitutional: Negative for fever, chills and diaphoresis.  Musculoskeletal: Negative for arthralgias and joint swelling.  Skin: Positive for color change and rash. Negative for pallor and wound.  Hematological: Negative for adenopathy. Does not bruise/bleed easily.  Psychiatric/Behavioral: Positive for sleep disturbance.       Objective:  BP 142/80  Pulse 78  Temp(Src) 98.3 F (36.8 C) (Oral)  Resp 18  Ht 5' 2.5" (1.588 m)  Wt 139 lb (63.05 kg)  BMI 25.00 kg/m2  SpO2 95%  Physical Exam  Constitutional: She is oriented to person, place, and time. She appears well-developed and well-nourished. No distress.  HENT:  Head: Normocephalic and atraumatic.  Right Ear: External ear normal.  Eyes: Conjunctivae are normal. No scleral icterus.  Pulmonary/Chest: Effort normal.  Neurological: She is alert and oriented to person, place, and time.  Skin: Skin is warm and dry. Rash noted. Rash is papular. She is not diaphoretic. No erythema.  Psychiatric: She has a normal mood and affect. Her behavior is normal.          Assessment & Plan:   Contact dermatitis  Papular rash, generalized  Nasal sinus congestion  Meds ordered this encounter  Medications  . calcium carbonate (OS-CAL) 600 MG TABS tablet    Sig: Take 600 mg by mouth 2 (two) times daily with a meal.  . gabapentin (NEURONTIN) 600 MG tablet     Sig: Take 600 mg by mouth daily.  Marland Kitchen aspirin EC 81 MG tablet    Sig: Take 81 mg by mouth daily.  . traZODone (DESYREL) 50 MG tablet    Sig: Take 50 mg by mouth at bedtime.  Marland Kitchen amLODipine (NORVASC) 5 MG tablet    Sig: Take 5 mg by mouth daily.  . sertraline (ZOLOFT) 100 MG tablet    Sig: Take 100 mg by mouth daily.  . cholecalciferol (VITAMIN D) 1000 UNITS tablet    Sig: Take 1,000 Units by mouth daily.  . cetirizine (ZYRTEC) 10 MG tablet    Sig: Take 1 tablet (10 mg total) by mouth at bedtime.    Dispense:  30 tablet    Refill:  1  . hydrocortisone 1 % lotion    Sig: Apply 1 application topically 2 (two) times daily.    Dispense:  250 mL    Refill:  0    Mix 1:1 with Eucerin lotion  . ranitidine (ZANTAC) 150 MG tablet    Sig: Take 1 tablet (150 mg total) by mouth 2 (two) times daily. For allergic reaction and rash    Dispense:  60 tablet    Refill:  0  . fluticasone (FLONASE) 50 MCG/ACT nasal spray    Sig: Place 2 sprays into both nostrils at bedtime.    Dispense:  16 g  Refill:  2    I personally performed the services described in this documentation, which was scribed in my presence. The recorded information has been reviewed and considered, and addended by me as needed.  Delman Cheadle, MD MPH

## 2014-08-24 ENCOUNTER — Encounter: Payer: Self-pay | Admitting: Gastroenterology

## 2015-02-22 ENCOUNTER — Other Ambulatory Visit: Payer: Self-pay | Admitting: *Deleted

## 2015-02-22 ENCOUNTER — Telehealth: Payer: Self-pay | Admitting: *Deleted

## 2015-02-22 DIAGNOSIS — N939 Abnormal uterine and vaginal bleeding, unspecified: Secondary | ICD-10-CM

## 2015-02-22 NOTE — Telephone Encounter (Signed)
Ultrasound appointment March 01, 2015 @ 1045.  Contacted patient, information concerning referral appointment and ultrasound given.  Pt verbalizes understanding.

## 2015-03-01 ENCOUNTER — Other Ambulatory Visit: Payer: Self-pay | Admitting: Obstetrics & Gynecology

## 2015-03-01 ENCOUNTER — Ambulatory Visit (HOSPITAL_COMMUNITY)
Admission: RE | Admit: 2015-03-01 | Discharge: 2015-03-01 | Disposition: A | Discharge status: Home / Self Care | Payer: 59 | Source: Ambulatory Visit | Attending: Obstetrics & Gynecology | Admitting: Obstetrics & Gynecology

## 2015-03-01 DIAGNOSIS — Z90710 Acquired absence of both cervix and uterus: Secondary | ICD-10-CM | POA: Insufficient documentation

## 2015-03-01 DIAGNOSIS — N939 Abnormal uterine and vaginal bleeding, unspecified: Secondary | ICD-10-CM

## 2015-03-01 DIAGNOSIS — N950 Postmenopausal bleeding: Secondary | ICD-10-CM | POA: Insufficient documentation

## 2015-03-03 ENCOUNTER — Encounter: Payer: Self-pay | Admitting: *Deleted

## 2015-03-31 ENCOUNTER — Encounter: Payer: Self-pay | Admitting: Obstetrics & Gynecology

## 2015-03-31 ENCOUNTER — Ambulatory Visit (INDEPENDENT_AMBULATORY_CARE_PROVIDER_SITE_OTHER): Payer: 59 | Admitting: Obstetrics & Gynecology

## 2015-03-31 VITALS — BP 175/80 | HR 64 | Temp 98.4°F | Ht 64.0 in | Wt 133.0 lb

## 2015-03-31 DIAGNOSIS — N950 Postmenopausal bleeding: Secondary | ICD-10-CM | POA: Diagnosis not present

## 2015-03-31 NOTE — Progress Notes (Signed)
CLINIC ENCOUNTER NOTE  History:  64 y.o. PMP female here today for evaluation of three separate episodes of vaginal bleeding that occurred in 12/2014.  She is s/p hysterectomy for benign indications several years ago.  Bleeding was not accompanied by any pain; denies increased urinary frequency, dysuria, rectal pain or other symptoms.  Bleeding episodes lasted less han 10-15 minutes, only noticed small amount on underwear. No other symptoms.  Past Medical History  Diagnosis Date  . Allergy   . Anxiety   . Arthritis   . Hypertension   . Neuromuscular disorder     Past Surgical History  Procedure Laterality Date  . Abdominal hysterectomy      The following portions of the patient's history were reviewed and updated as appropriate: allergies, current medications, past family history, past medical history, past social history, past surgical history and problem list.   Health Maintenance:  Normal mammogram recently as per patient.  Review of Systems:  Pertinent items are noted in HPI. Comprehensive review of systems was otherwise negative.  Objective:  Physical Exam BP 175/80 mmHg  Pulse 64  Temp(Src) 98.4 F (36.9 C) (Oral)  Ht 5\' 4"  (1.626 m)  Wt 133 lb (60.328 kg)  BMI 22.82 kg/m2 CONSTITUTIONAL: Well-developed, well-nourished female in no acute distress.  HENT:  Normocephalic, atraumatic, External right and left ear normal. Oropharynx is clear and moist EYES: Conjunctivae and EOM are normal. Pupils are equal, round, and reactive to light. No scleral icterus.  NECK: Normal range of motion, supple, no masses SKIN: Skin is warm and dry. No rash noted. Not diaphoretic. No erythema. No pallor. [place]: Alert and oriented to person, place, and time. Normal reflexes, muscle tone coordination. No cranial nerve deficit noted. PSYCHIATRIC: Normal mood and affect. Normal behavior. Normal judgment and thought content. CARDIOVASCULAR: Normal heart rate noted, regular  rhythm RESPIRATORY: Effort and breath sounds normal, no problems with respiration noted ABDOMEN: Soft, no distention noted.  No tenderness, rebound or guarding.  PELVIC: Atrophic external genitalia; atrophic vaginal mucosa and cuff.  Scant discharge.  MUSCULOSKELETAL: Normal range of motion. No edema and no tenderness.  Labs and Imaging 03/01/2015   CLINICAL DATA:  Postmenopausal bleeding for 3 months.  EXAM: TRANSABDOMINAL AND TRANSVAGINAL ULTRASOUND OF PELVIS  TECHNIQUE: Both transabdominal and transvaginal ultrasound examinations of the pelvis were performed. Transabdominal technique was performed for global imaging of the pelvis including uterus, ovaries, adnexal regions, and pelvic cul-de-sac. It was necessary to proceed with endovaginal exam following the transabdominal exam to visualize the vaginal cuff, ovaries, and adnexae.  COMPARISON:  None  FINDINGS: Uterus  Measurements: Surgically absent. No masses visualized in the hysterectomy bed or area the vaginal cuff.  Endometrium  Thickness: Surgically absent.  Right ovary  Measurements: Not directly visualized by transabdominal or transvaginal sonography, however no adnexal mass identified.  Left ovary  Measurements: Not directly visualized by transabdominal or transvaginal sonography, however no adnexal mass identified.  Other findings  No free fluid.  IMPRESSION: Prior hysterectomy. No pelvic mass or other significant abnormality identified.  Nonvisualization of ovaries, which could be due to surgical absence or postmenopausal atrophy.   Electronically Signed   By: Earle Gell M.D.   On: 03/01/2015 11:12    Assessment & Plan:  Postmenopausal bleeding, likely secondary to atrophy.  No lesions seen on exam. No evidence of urethral or rectal lesions Patient reassured, urged to ED if bleeding recurs in order to have examination while bleeding is occuring and ensure there is no other  etiology.   Verita Schneiders, MD, [place] Attending [facility] for [employer], [place]

## 2015-03-31 NOTE — Patient Instructions (Signed)
Return to clinic for any scheduled appointments or for any gynecologic concerns as needed.   

## 2016-01-03 ENCOUNTER — Encounter: Payer: Self-pay | Admitting: Internal Medicine

## 2016-01-03 ENCOUNTER — Ambulatory Visit (INDEPENDENT_AMBULATORY_CARE_PROVIDER_SITE_OTHER): Payer: BLUE CROSS/BLUE SHIELD | Admitting: Internal Medicine

## 2016-01-03 ENCOUNTER — Other Ambulatory Visit: Payer: Self-pay

## 2016-01-03 VITALS — BP 186/91 | HR 70 | Temp 98.7°F | Ht 64.0 in | Wt 140.8 lb

## 2016-01-03 DIAGNOSIS — E7800 Pure hypercholesterolemia, unspecified: Secondary | ICD-10-CM | POA: Diagnosis not present

## 2016-01-03 DIAGNOSIS — Z0000 Encounter for general adult medical examination without abnormal findings: Secondary | ICD-10-CM | POA: Insufficient documentation

## 2016-01-03 DIAGNOSIS — F418 Other specified anxiety disorders: Secondary | ICD-10-CM

## 2016-01-03 DIAGNOSIS — I10 Essential (primary) hypertension: Secondary | ICD-10-CM | POA: Diagnosis not present

## 2016-01-03 DIAGNOSIS — Z1231 Encounter for screening mammogram for malignant neoplasm of breast: Secondary | ICD-10-CM

## 2016-01-03 HISTORY — DX: Encounter for general adult medical examination without abnormal findings: Z00.00

## 2016-01-03 MED ORDER — SIMVASTATIN 20 MG PO TABS: 20.0000 mg | ORAL_TABLET | Freq: Every day | ORAL | Status: DC

## 2016-01-03 MED ORDER — TRAZODONE HCL 50 MG PO TABS: 50.0000 mg | ORAL_TABLET | Freq: Every day | ORAL | Status: DC

## 2016-01-03 MED ORDER — AMLODIPINE BESYLATE 10 MG PO TABS: 10.0000 mg | ORAL_TABLET | Freq: Every day | ORAL | Status: DC

## 2016-01-03 NOTE — Progress Notes (Signed)
Internal Medicine Clinic Attending  Case discussed with Dr. Jones soon after the resident saw the patient.  We reviewed the resident's history and exam and pertinent patient test results.  I agree with the assessment, diagnosis, and plan of care documented in the resident's note. 

## 2016-01-03 NOTE — Assessment & Plan Note (Signed)
Follows with a Dr. Celine Ahr who is a psychiatrist. Managed on Zoloft 100 mg daily + Trazodone 50 mg qhs prn for sleep. Doing well with this.

## 2016-01-03 NOTE — Assessment & Plan Note (Signed)
BP Readings from Last 3 Encounters:  01/03/16 186/91  03/31/15 175/80  07/14/14 142/80    Lab Results  Component Value Date   NA 141 06/01/2010   K 3.9 06/01/2010   CREATININE 0.62 06/01/2010    Assessment: Blood pressure control:  Elevated Comments: Takes Amlodipine 5 mg daily only for her BP.   Plan: Medications:  Increase Amlodipine to 10 mg daily. Check BMP, will likely need second agent as well, may start HCTZ at her next clinic visit.  Educational resources provided: brochure, handout Self management tools provided:   Other plans: RTC in 2 weeks.

## 2016-01-03 NOTE — Progress Notes (Signed)
Subjective:   Patient ID: Mia Underwood female   DOB: May 14, 1951 65 y.o.   MRN: [id number]  HPI: [name] is a 65 y.o. female w/ PMHx of HTN, anxiety, depression, and tobacco abuse, presents to the clinic today to establish care, mostly for management of her HTN. Patient does not have any real complaints, says she has been taking care of her mother a lot recently which causes her some anxiety. No recent chest pain, SOB, fever, chills, nausea, vomiting or diarrhea. Patient does smoke, drinks 12 beers per week, and smokes marijuana occasionally. Family history listed below. Has not had a mammogram in at least 2 years. BP elevated today, states that she did in fact take her BP medication. No headache, vision changes, dizziness, or confusion. Continues to take Zoloft 100 mg daily and Trazodone 50 mg qhs prn for sleep.   Past Medical History  Diagnosis Date  . Allergy   . Anxiety   . Arthritis   . Hypertension   . Neuromuscular disorder ([place])    Family History  Problem Relation Age of Onset  . Cancer Mother    Current Outpatient Prescriptions  Medication Sig Dispense Refill  . amLODipine (NORVASC) 5 MG tablet Take 5 mg by mouth daily.    [place] Kitchen aspirin EC 81 MG tablet Take 81 mg by mouth daily.    . calcium carbonate (OS-CAL) 600 MG TABS tablet Take 600 mg by mouth 2 (two) times daily with a meal.    . cetirizine (ZYRTEC) 10 MG tablet Take 1 tablet (10 mg total) by mouth at bedtime. (Patient not taking: Reported on 03/31/2015) 30 tablet 1  . cholecalciferol (VITAMIN D) 1000 UNITS tablet Take 1,000 Units by mouth daily.    . fluticasone (FLONASE) 50 MCG/ACT nasal spray Place 2 sprays into both nostrils at bedtime. (Patient not taking: Reported on 03/31/2015) 16 g 2  . gabapentin (NEURONTIN) 600 MG tablet Take 600 mg by mouth daily.    . hydrocortisone 1 % lotion Apply 1 application topically 2 (two) times daily. (Patient not taking: Reported on 03/31/2015) 250 mL 0  . ranitidine (ZANTAC)  150 MG tablet Take 1 tablet (150 mg total) by mouth 2 (two) times daily. For allergic reaction and rash (Patient not taking: Reported on 03/31/2015) 60 tablet 0  . sertraline (ZOLOFT) 100 MG tablet Take 100 mg by mouth daily.    . traZODone (DESYREL) 50 MG tablet Take 50 mg by mouth at bedtime.     No current facility-administered medications for this visit.    Review of Systems: General: Denies fever, chills, diaphoresis, appetite change and fatigue.  Respiratory: Denies SOB, DOE, cough, and wheezing.   Cardiovascular: Denies chest pain and palpitations.  Gastrointestinal: Denies nausea, vomiting, abdominal pain, and diarrhea.  Genitourinary: Denies dysuria, increased frequency, and flank pain. Endocrine: Denies hot or cold intolerance, polyuria, and polydipsia. Musculoskeletal: Denies myalgias, back pain, joint swelling, arthralgias and gait problem.  Skin: Denies pallor, rash and wounds.  Neurological: Denies dizziness, seizures, syncope, weakness, lightheadedness, numbness and headaches.  Psychiatric/Behavioral: Denies mood changes, and sleep disturbances.  Objective:   Physical Exam: Filed Vitals:   01/03/16 0829  BP: 178/95  Pulse: 83  Temp: 98.7 F (37.1 C)  TempSrc: Oral  Height: 5\' 4"  (1.626 m)  Weight: 140 lb 12.8 oz (63.866 kg)  SpO2: 100%    General: AA female, alert, cooperative, NAD. HEENT: PERRL, EOMI. Moist mucus membranes Neck: Full range of motion without pain, supple, no  lymphadenopathy or carotid bruits Lungs: Clear to ascultation bilaterally, normal work of respiration, no wheezes, rales, rhonchi Heart: RRR, no murmurs, gallops, or rubs Abdomen: Soft, non-tender, non-distended, BS + Extremities: No cyanosis, clubbing, or edema Neurologic: Alert & oriented X3, cranial nerves II-XII intact, strength grossly intact, sensation intact to light touch   Assessment & Plan:   Please see problem based assessment and plan.

## 2016-01-03 NOTE — Assessment & Plan Note (Signed)
Takes Simva 20 mg qhs -Check lipids

## 2016-01-03 NOTE — Assessment & Plan Note (Signed)
Patient has not had mammo in 2 years or so, has family history of breast cancer.  -Mammo ordered -Also check lipid profile, Hep C Ab

## 2016-01-03 NOTE — Patient Instructions (Signed)
1. Please make a follow up appointment for 2 weeks.   2. Please take all medications as previously prescribed with the following changes:  Increase your Amlodipine to 10 mg daily. A new prescription was sent to your pharmacy.   3. If you have worsening of your symptoms or new symptoms arise, please call the clinic [phone]), or go to the ER immediately if symptoms are severe.   Smoking Cessation, Tips for Success If you are ready to quit smoking, congratulations! You have chosen to help yourself be healthier. Cigarettes bring nicotine, tar, carbon monoxide, and other irritants into your body. Your lungs, heart, and blood vessels will be able to work better without these poisons. There are many different ways to quit smoking. Nicotine gum, nicotine patches, a nicotine inhaler, or nicotine nasal spray can help with physical craving. Hypnosis, support groups, and medicines help break the habit of smoking. WHAT THINGS CAN I DO TO MAKE QUITTING EASIER?  Here are some tips to help you quit for good:  Pick a date when you will quit smoking completely. Tell all of your friends and family about your plan to quit on that date.  Do not try to slowly cut down on the number of cigarettes you are smoking. Pick a quit date and quit smoking completely starting on that day.  Throw away all cigarettes.   Clean and remove all ashtrays from your home, work, and car.  On a card, write down your reasons for quitting. Carry the card with you and read it when you get the urge to smoke.  Cleanse your body of nicotine. Drink enough water and fluids to keep your urine clear or pale yellow. Do this after quitting to flush the nicotine from your body.  Learn to predict your moods. Do not let a bad situation be your excuse to have a cigarette. Some situations in your life might tempt you into wanting a cigarette.  Never have "just one" cigarette. It leads to wanting another and another. Remind yourself of your  decision to quit.  Change habits associated with smoking. If you smoked while driving or when feeling stressed, try other activities to replace smoking. Stand up when drinking your coffee. Brush your teeth after eating. Sit in a different chair when you read the paper. Avoid alcohol while trying to quit, and try to drink fewer caffeinated beverages. Alcohol and caffeine may urge you to smoke.  Avoid foods and drinks that can trigger a desire to smoke, such as sugary or spicy foods and alcohol.  Ask people who smoke not to smoke around you.  Have something planned to do right after eating or having a cup of coffee. For example, plan to take a walk or exercise.  Try a relaxation exercise to calm you down and decrease your stress. Remember, you may be tense and nervous for the first 2 weeks after you quit, but this will pass.  Find new activities to keep your hands busy. Play with a pen, coin, or rubber band. Doodle or draw things on paper.  Brush your teeth right after eating. This will help cut down on the craving for the taste of tobacco after meals. You can also try mouthwash.   Use oral substitutes in place of cigarettes. Try using lemon drops, carrots, cinnamon sticks, or chewing gum. Keep them handy so they are available when you have the urge to smoke.  When you have the urge to smoke, try deep breathing.  Designate your home as  a nonsmoking area.  If you are a heavy smoker, ask your health care provider about a prescription for nicotine chewing gum. It can ease your withdrawal from nicotine.  Reward yourself. Set aside the cigarette money you save and buy yourself something nice.  Look for support from others. Join a support group or smoking cessation program. Ask someone at home or at work to help you with your plan to quit smoking.  Always ask yourself, "Do I need this cigarette or is this just a reflex?" Tell yourself, "Today, I choose not to smoke," or "I do not want to smoke."  You are reminding yourself of your decision to quit.  Do not replace cigarette smoking with electronic cigarettes (commonly called e-cigarettes). The safety of e-cigarettes is unknown, and some may contain harmful chemicals.  If you relapse, do not give up! Plan ahead and think about what you will do the next time you get the urge to smoke. HOW WILL I FEEL WHEN I QUIT SMOKING? You may have symptoms of withdrawal because your body is used to nicotine (the addictive substance in cigarettes). You may crave cigarettes, be irritable, feel very hungry, cough often, get headaches, or have difficulty concentrating. The withdrawal symptoms are only temporary. They are strongest when you first quit but will go away within 10-14 days. When withdrawal symptoms occur, stay in control. Think about your reasons for quitting. Remind yourself that these are signs that your body is healing and getting used to being without cigarettes. Remember that withdrawal symptoms are easier to treat than the major diseases that smoking can cause.  Even after the withdrawal is over, expect periodic urges to smoke. However, these cravings are generally short lived and will go away whether you smoke or not. Do not smoke! WHAT RESOURCES ARE AVAILABLE TO HELP ME QUIT SMOKING? Your health care provider can direct you to community resources or hospitals for support, which may include:  Group support.  Education.  Hypnosis.  Therapy.   This information is not intended to replace advice given to you by your health care provider. Make sure you discuss any questions you have with your health care provider.   Document Released: 08/11/2004 Document Revised: 12/04/2014 Document Reviewed: 05/01/2013 Elsevier Interactive Patient Education [employer].

## 2016-01-04 LAB — LIPID PANEL
CHOLESTEROL TOTAL: 217 mg/dL — AB (ref 100–199)
Chol/HDL Ratio: 3.3 ratio units (ref 0.0–4.4)
HDL: 66 mg/dL (ref >39)
LDL Calculated: 127 mg/dL — ABNORMAL HIGH (ref 0–99)
Triglycerides: 122 mg/dL (ref 0–149)
VLDL CHOLESTEROL CAL: 24 mg/dL (ref 5–40)

## 2016-01-04 LAB — HEPATITIS C ANTIBODY

## 2016-01-04 LAB — BMP8+ANION GAP
ANION GAP: 18 mmol/L (ref 10.0–18.0)
BUN / CREAT RATIO: 14 (ref 11–26)
BUN: 8 mg/dL (ref 8–27)
CO2: 21 mmol/L (ref 18–29)
CREATININE: 0.58 mg/dL (ref 0.57–1.00)
Calcium: 9.5 mg/dL (ref 8.7–10.3)
Chloride: 106 mmol/L (ref 96–106)
GFR calc Af Amer: 113 mL/min/{1.73_m2} (ref >59)
GFR, EST NON AFRICAN AMERICAN: 98 mL/min/{1.73_m2} (ref >59)
Glucose: 100 mg/dL — ABNORMAL HIGH (ref 65–99)
Potassium: 4.2 mmol/L (ref 3.5–5.2)
Sodium: 145 mmol/L — ABNORMAL HIGH (ref 134–144)

## 2016-01-13 ENCOUNTER — Ambulatory Visit
Admission: RE | Admit: 2016-01-13 | Discharge: 2016-01-13 | Disposition: A | Discharge status: Home / Self Care | Payer: BLUE CROSS/BLUE SHIELD | Source: Ambulatory Visit

## 2016-01-13 DIAGNOSIS — Z1231 Encounter for screening mammogram for malignant neoplasm of breast: Secondary | ICD-10-CM

## 2016-01-14 ENCOUNTER — Telehealth: Payer: Self-pay | Admitting: Internal Medicine

## 2016-01-14 NOTE — Telephone Encounter (Signed)
APPT. REMINDER, PHONE OFF

## 2016-01-17 ENCOUNTER — Encounter: Payer: Self-pay | Admitting: Internal Medicine

## 2016-01-17 ENCOUNTER — Ambulatory Visit (INDEPENDENT_AMBULATORY_CARE_PROVIDER_SITE_OTHER): Payer: BLUE CROSS/BLUE SHIELD | Admitting: Internal Medicine

## 2016-01-17 VITALS — BP 150/78 | HR 76 | Temp 98.8°F | Ht 64.0 in | Wt 137.7 lb

## 2016-01-17 DIAGNOSIS — F17210 Nicotine dependence, cigarettes, uncomplicated: Secondary | ICD-10-CM | POA: Diagnosis not present

## 2016-01-17 DIAGNOSIS — Z720 Tobacco use: Secondary | ICD-10-CM

## 2016-01-17 DIAGNOSIS — F17200 Nicotine dependence, unspecified, uncomplicated: Secondary | ICD-10-CM | POA: Insufficient documentation

## 2016-01-17 DIAGNOSIS — I10 Essential (primary) hypertension: Secondary | ICD-10-CM

## 2016-01-17 DIAGNOSIS — Z0000 Encounter for general adult medical examination without abnormal findings: Secondary | ICD-10-CM

## 2016-01-17 DIAGNOSIS — Z87891 Personal history of nicotine dependence: Secondary | ICD-10-CM | POA: Insufficient documentation

## 2016-01-17 HISTORY — DX: Tobacco use: Z72.0

## 2016-01-17 MED ORDER — HYDROCHLOROTHIAZIDE 12.5 MG PO CAPS: 12.5000 mg | ORAL_CAPSULE | Freq: Every day | ORAL | Status: DC

## 2016-01-17 MED ORDER — NICOTINE POLACRILEX 2 MG MT GUM: 2.0000 mg | CHEWING_GUM | OROMUCOSAL | Status: DC | PRN

## 2016-01-17 NOTE — Assessment & Plan Note (Signed)
Mammo negative, Hep C negative.

## 2016-01-17 NOTE — Assessment & Plan Note (Signed)
Smokes 1 pack per week.  -Given Nicorette prn for cravings. Will try this for now. If not helpful, can try low dose patch.

## 2016-01-17 NOTE — Assessment & Plan Note (Signed)
BP Readings from Last 3 Encounters:  01/17/16 150/78  01/03/16 186/91  03/31/15 175/80    Lab Results  Component Value Date   NA 145* 01/03/2016   K 4.2 01/03/2016   CREATININE 0.58 01/03/2016    Assessment: Blood pressure control:  Mildly elevated Progress toward BP goal:   Improved Comments: Increased dose of amlodipine to 10 mg daily at last appointment. BP still mildly elevated. BMP checked at last visit shows normal Cr and K.   Plan: Medications:  Continue Norvasc. Add HCTZ 12.5 mg daily.  Other plans: RTC in 4 weeks for BP follow up. May need to increase to HCTZ 25 mg daily.

## 2016-01-17 NOTE — Progress Notes (Signed)
   Subjective:   Patient ID: Mia Underwood female   DOB: 10-26-1951 65 y.o.   MRN: [id number]  HPI: Mia Underwood is a 65 y.o. female w/ PMHx of HTN, anxiety, depression, and tobacco abuse, presents to the clinic today for follow up regarding her HTN. Patient has been doing well since her last visit, has been taking her medications as prescribed. No adverse effects with increased dose of Norvasc, no LE swelling. Still having a bit of a hard time sleeping on account of caring for her mother.   Current Outpatient Prescriptions  Medication Sig Dispense Refill  . amLODipine (NORVASC) 10 MG tablet Take 1 tablet (10 mg total) by mouth daily. 30 tablet 5  . aspirin EC 81 MG tablet Take 81 mg by mouth daily.    . calcium carbonate (OS-CAL) 600 MG TABS tablet Take 600 mg by mouth 2 (two) times daily with a meal.    . cetirizine (ZYRTEC) 10 MG tablet Take 1 tablet (10 mg total) by mouth at bedtime. (Patient not taking: Reported on 03/31/2015) 30 tablet 1  . cholecalciferol (VITAMIN D) 1000 UNITS tablet Take 1,000 Units by mouth daily.    . fluticasone (FLONASE) 50 MCG/ACT nasal spray Place 2 sprays into both nostrils at bedtime. (Patient not taking: Reported on 03/31/2015) 16 g 2  . gabapentin (NEURONTIN) 600 MG tablet Take 600 mg by mouth daily.    . sertraline (ZOLOFT) 100 MG tablet Take 100 mg by mouth daily.    . simvastatin (ZOCOR) 20 MG tablet Take 1 tablet (20 mg total) by mouth daily. 30 tablet   . traZODone (DESYREL) 50 MG tablet Take 1 tablet (50 mg total) by mouth at bedtime. 30 tablet 2   No current facility-administered medications for this visit.    Review of Systems: General: Denies fever, chills, diaphoresis, appetite change and fatigue.  Respiratory: Denies SOB, DOE, cough, and wheezing.   Cardiovascular: Denies chest pain and palpitations.  Gastrointestinal: Denies nausea, vomiting, abdominal pain, and diarrhea.  Genitourinary: Denies dysuria, increased frequency, and flank  pain. Endocrine: Denies hot or cold intolerance, polyuria, and polydipsia. Musculoskeletal: Denies myalgias, back pain, joint swelling, arthralgias and gait problem.  Skin: Denies pallor, rash and wounds.  Neurological: Denies dizziness, seizures, syncope, weakness, lightheadedness, numbness and headaches.  Psychiatric/Behavioral: Denies mood changes, and sleep disturbances.  Objective:   Physical Exam: Filed Vitals:   01/17/16 0903  BP: 147/72  Pulse: 74  Temp: 98.8 F (37.1 C)  TempSrc: Oral  Height: 5\' 4"  (1.626 m)  Weight: 137 lb 11.2 oz (62.46 kg)  SpO2: 98%    General: AA female, alert, cooperative, NAD. HEENT: PERRL, EOMI. Moist mucus membranes Neck: Full range of motion without pain, supple, no lymphadenopathy or carotid bruits Lungs: Clear to ascultation bilaterally, normal work of respiration, no wheezes, rales, rhonchi Heart: RRR, no murmurs, gallops, or rubs Abdomen: Soft, non-tender, non-distended, BS + Extremities: No cyanosis, clubbing, or edema Neurologic: Alert & oriented X3, cranial nerves II-XII intact, strength grossly intact, sensation intact to light touch   Assessment & Plan:   Please see problem based assessment and plan.

## 2016-01-17 NOTE — Patient Instructions (Signed)
1. Make a follow up appointment for 4 weeks.   2. Please take all medications as previously prescribed with the following changes:  Start taking HCTZ 12.5 mg daily IN ADDITION to your Norvasc (amlodipine) 10 mg daily.   Use Nicorette gum when you have a craving for a cigarette. If this doesn't work for you we can try the patch next.   3. If you have worsening of your symptoms or new symptoms arise, please call the clinic [phone]), or go to the ER immediately if symptoms are severe.  You have done a great job in taking all your medications. Please continue to do this.

## 2016-01-18 NOTE — Progress Notes (Signed)
Internal Medicine Clinic Attending  Case discussed with Dr. Jones soon after the resident saw the patient.  We reviewed the resident's history and exam and pertinent patient test results.  I agree with the assessment, diagnosis, and plan of care documented in the resident's note. 

## 2016-02-14 ENCOUNTER — Ambulatory Visit (INDEPENDENT_AMBULATORY_CARE_PROVIDER_SITE_OTHER): Payer: BLUE CROSS/BLUE SHIELD | Admitting: Internal Medicine

## 2016-02-14 ENCOUNTER — Encounter: Payer: Self-pay | Admitting: Internal Medicine

## 2016-02-14 VITALS — BP 157/73 | HR 93 | Temp 98.3°F | Ht 64.0 in | Wt 140.9 lb

## 2016-02-14 DIAGNOSIS — F17211 Nicotine dependence, cigarettes, in remission: Secondary | ICD-10-CM

## 2016-02-14 DIAGNOSIS — F418 Other specified anxiety disorders: Secondary | ICD-10-CM

## 2016-02-14 DIAGNOSIS — Z79899 Other long term (current) drug therapy: Secondary | ICD-10-CM

## 2016-02-14 DIAGNOSIS — I10 Essential (primary) hypertension: Secondary | ICD-10-CM

## 2016-02-14 DIAGNOSIS — Z720 Tobacco use: Secondary | ICD-10-CM

## 2016-02-14 MED ORDER — HYDROCHLOROTHIAZIDE 25 MG PO TABS: 25.0000 mg | ORAL_TABLET | Freq: Every day | ORAL | Status: DC

## 2016-02-14 MED ORDER — NICOTINE POLACRILEX 2 MG MT GUM
2.0000 mg | CHEWING_GUM | OROMUCOSAL | Status: DC | PRN
Start: 2016-02-14 — End: 2017-06-13

## 2016-02-14 NOTE — Assessment & Plan Note (Signed)
Most of our discussion revolved around her anxiety and stress involving her family. Taking Zoloft 100 mg daily + Trazodone qhs. -Refer for psychology for therapy, may be helpful in finding coping mechanisms to aide in dealing with her family issues.

## 2016-02-14 NOTE — Progress Notes (Signed)
Subjective:   Patient ID: CHIRSTINA Underwood female   DOB: [birth year] 65 y.o.   MRN: [id number]  HPI: [name] is a 65 y.o. female w/ PMHx of HTN, anxiety, depression, and tobacco abuse, presents to the clinic today for follow up regarding her HTN. Patient was last seen on 01/17/16 at which time we added HCTZ 12.5 mg daily. She has been taking this without any issues. Her BP continues to be slightly elevated. She states she has been having issues with her family that cause her significant stress and anxiety, especially over the past 1-2 weeks.    She also says she has not smoked a cigarette since her last clinic visit. She has been using nicotine replacement gum 1-2 times per day and says this has been very helpful in controlling her cravings.    Current Outpatient Prescriptions  Medication Sig Dispense Refill  . amLODipine (NORVASC) 10 MG tablet Take 1 tablet (10 mg total) by mouth daily. 30 tablet 5  . aspirin EC 81 MG tablet Take 81 mg by mouth daily.    . calcium carbonate (OS-CAL) 600 MG TABS tablet Take 600 mg by mouth 2 (two) times daily with a meal.    . cetirizine (ZYRTEC) 10 MG tablet Take 1 tablet (10 mg total) by mouth at bedtime. (Patient not taking: Reported on 03/31/2015) 30 tablet 1  . cholecalciferol (VITAMIN D) 1000 UNITS tablet Take 1,000 Units by mouth daily.    . fluticasone (FLONASE) 50 MCG/ACT nasal spray Place 2 sprays into both nostrils at bedtime. (Patient not taking: Reported on 03/31/2015) 16 g 2  . gabapentin (NEURONTIN) 600 MG tablet Take 600 mg by mouth daily.    . hydrochlorothiazide (MICROZIDE) 12.5 MG capsule Take 1 capsule (12.5 mg total) by mouth daily. 30 capsule 5  . nicotine polacrilex (NICORETTE) 2 MG gum Take 1 each (2 mg total) by mouth as needed for smoking cessation. 100 tablet 1  . sertraline (ZOLOFT) 100 MG tablet Take 100 mg by mouth daily.    . simvastatin (ZOCOR) 20 MG tablet Take 1 tablet (20 mg total) by mouth daily. 30 tablet   .  traZODone (DESYREL) 50 MG tablet Take 1 tablet (50 mg total) by mouth at bedtime. 30 tablet 2   No current facility-administered medications for this visit.    Review of Systems: General: Denies fever, chills, diaphoresis, appetite change and fatigue.  Respiratory: Denies SOB, DOE, cough, and wheezing.   Cardiovascular: Denies chest pain and palpitations.  Gastrointestinal: Denies nausea, vomiting, abdominal pain, and diarrhea.  Genitourinary: Denies dysuria, increased frequency, and flank pain. Endocrine: Denies hot or cold intolerance, polyuria, and polydipsia. Musculoskeletal: Denies myalgias, back pain, joint swelling, arthralgias and gait problem.  Skin: Denies pallor, rash and wounds.  Neurological: Denies dizziness, seizures, syncope, weakness, lightheadedness, numbness and headaches.  Psychiatric/Behavioral: Denies mood changes, and sleep disturbances.  Objective:   Physical Exam: Filed Vitals:   02/14/16 0906  BP: 160/65  Pulse: 67  Temp: 98.3 F (36.8 C)  TempSrc: Oral  Height: 5\' 4"  (1.626 m)  Weight: 140 lb 14.4 oz (63.912 kg)  SpO2: 96%    General: AA female, alert, cooperative, NAD. HEENT: PERRL, EOMI. Moist mucus membranes Neck: Full range of motion without pain, supple, no lymphadenopathy or carotid bruits Lungs: Clear to ascultation bilaterally, normal work of respiration, no wheezes, rales, rhonchi Heart: RRR, no murmurs, gallops, or rubs Abdomen: Soft, non-tender, non-distended, BS + Extremities: No cyanosis, clubbing, or edema Neurologic:  Alert & oriented X3, cranial nerves II-XII intact, strength grossly intact, sensation intact to light touch   Assessment & Plan:   Please see problem based assessment and plan.

## 2016-02-14 NOTE — Assessment & Plan Note (Signed)
BP Readings from Last 3 Encounters:  02/14/16 157/73  01/17/16 150/78  01/03/16 186/91    Lab Results  Component Value Date   NA 145* 01/03/2016   K 4.2 01/03/2016   CREATININE 0.58 01/03/2016    Assessment: Blood pressure control:  Elevated Comments: Recently added HCTZ 12.5 mg daily to her regimen. She has ben taking this. Seems she is dealing a lot with stress and anxiety involving her family, very well could be associated with her BP.   Plan: Medications:  Increase HCTZ to 25 mg daily. Continue Norvasc 10 mg daily.  Other plans: Check BMP today given addition of thiazide. Will also place referral for outpatient psychology, may be helpful in finding coping mechanisms for her stress and aide in controlling her BP.  -RTC in 2 weeks.

## 2016-02-14 NOTE — Patient Instructions (Signed)
1. Please follow up in 2 weeks.   2. Please take all medications as previously prescribed with the following changes:  Increase HCTZ to 25 mg daily.   3. If you have worsening of your symptoms or new symptoms arise, please call the clinic [phone]), or go to the ER immediately if symptoms are severe.  You have done a great job in taking all your medications. Please continue to do this.

## 2016-02-14 NOTE — Assessment & Plan Note (Signed)
Given nicorette at last visit, says she has not smoked since. Uses 1-2 pieces of gum per day.  -Continue Nicorette for now. Discussed attempts to wean off of gum in the future. Will continue for now.

## 2016-02-15 LAB — BMP8+ANION GAP
Anion Gap: 18 mmol/L (ref 10.0–18.0)
BUN/Creatinine Ratio: 15 (ref 11–26)
BUN: 9 mg/dL (ref 8–27)
CO2: 22 mmol/L (ref 18–29)
CREATININE: 0.6 mg/dL (ref 0.57–1.00)
Calcium: 9.7 mg/dL (ref 8.7–10.3)
Chloride: 101 mmol/L (ref 96–106)
GFR calc Af Amer: 112 mL/min/{1.73_m2} (ref >59)
GFR, EST NON AFRICAN AMERICAN: 97 mL/min/{1.73_m2} (ref >59)
GLUCOSE: 107 mg/dL — AB (ref 65–99)
Potassium: 4 mmol/L (ref 3.5–5.2)
Sodium: 141 mmol/L (ref 134–144)

## 2016-02-15 NOTE — Progress Notes (Signed)
Internal Medicine Clinic Attending  Case discussed with Dr. Jones soon after the resident saw the patient.  We reviewed the resident's history and exam and pertinent patient test results.  I agree with the assessment, diagnosis, and plan of care documented in the resident's note. 

## 2016-02-16 ENCOUNTER — Telehealth: Payer: Self-pay | Admitting: Licensed Clinical Social Worker

## 2016-02-16 NOTE — Telephone Encounter (Signed)
Mia Underwood was referred to CSW for Psychology referral for Depression with Anxiety.  Pt is covered through [employer].  CSW utilized [employer] to obtain [employer] that specialize in anxiety.  CSW obtained 3 potential based on location and specialty: [place] and [place].  CSW placed called to pt.  CSW left message requesting return call. CSW provided contact hours and phone number.

## 2016-02-18 NOTE — Telephone Encounter (Signed)
CSW placed called to pt.  CSW left message requesting return call. CSW provided contact hours and phone number. 

## 2016-02-21 NOTE — Telephone Encounter (Signed)
CSW attempted to contact Ms. Frese today.  Pt's voice mailbox full, unable to leave message.  No response back from patient x 2 messages.  CSW will mail letter of network providers and sign off.

## 2016-02-23 ENCOUNTER — Telehealth: Payer: Self-pay | Admitting: Internal Medicine

## 2016-02-23 NOTE — Telephone Encounter (Signed)
PPT. REMINDER CALL, LMTCB

## 2016-02-25 ENCOUNTER — Encounter: Payer: Self-pay | Admitting: Internal Medicine

## 2016-02-25 ENCOUNTER — Ambulatory Visit (INDEPENDENT_AMBULATORY_CARE_PROVIDER_SITE_OTHER): Payer: BLUE CROSS/BLUE SHIELD | Admitting: Internal Medicine

## 2016-02-25 VITALS — BP 117/66 | HR 91 | Temp 98.2°F | Ht 64.0 in | Wt 134.6 lb

## 2016-02-25 DIAGNOSIS — I10 Essential (primary) hypertension: Secondary | ICD-10-CM

## 2016-02-25 DIAGNOSIS — Z79899 Other long term (current) drug therapy: Secondary | ICD-10-CM | POA: Diagnosis not present

## 2016-02-25 DIAGNOSIS — Z87891 Personal history of nicotine dependence: Secondary | ICD-10-CM | POA: Diagnosis not present

## 2016-02-25 DIAGNOSIS — Z0000 Encounter for general adult medical examination without abnormal findings: Secondary | ICD-10-CM

## 2016-02-25 NOTE — Assessment & Plan Note (Signed)
BP Readings from Last 3 Encounters:  02/25/16 117/66  02/14/16 157/73  01/17/16 150/78    Lab Results  Component Value Date   NA 141 02/14/2016   K 4.0 02/14/2016   CREATININE 0.60 02/14/2016    Assessment: Blood pressure control:  Well controlled Progress toward BP goal:   At goal Comments: Patient is very happy with her blood pressure. Tolerating her meds, anxiety is well controlled currently.   Plan: Medications:  continue current medications; HCTZ 25 mg daily + Norvasc 10 mg daily.  Other plans: RTC in 3 months. Renal function checked on 02/14/16 unremarkable.

## 2016-02-25 NOTE — Progress Notes (Signed)
   Subjective:   Patient ID: Mia Underwood female   DOB: 1951/08/20 65 y.o.   MRN: [id number]  HPI: Mia Underwood is a 65 y.o. female w/ PMHx of HTN, anxiety, depression, and tobacco abuse, presents to the clinic today for follow up regarding her HTN. Patient is doing very well, has not complaints. Says her family issues have been better recently and she is exercising regularly with her mother, which has been good for their relationship. She has only had one cigarette in the course of the past 1 month since she started using her nicorette gum. No symptoms of depression or anxiety lately.   Current Outpatient Prescriptions  Medication Sig Dispense Refill  . amLODipine (NORVASC) 10 MG tablet Take 1 tablet (10 mg total) by mouth daily. 30 tablet 5  . aspirin EC 81 MG tablet Take 81 mg by mouth daily.    . calcium carbonate (OS-CAL) 600 MG TABS tablet Take 600 mg by mouth 2 (two) times daily with a meal.    . cetirizine (ZYRTEC) 10 MG tablet Take 1 tablet (10 mg total) by mouth at bedtime. (Patient not taking: Reported on 03/31/2015) 30 tablet 1  . cholecalciferol (VITAMIN D) 1000 UNITS tablet Take 1,000 Units by mouth daily.    . fluticasone (FLONASE) 50 MCG/ACT nasal spray Place 2 sprays into both nostrils at bedtime. (Patient not taking: Reported on 03/31/2015) 16 g 2  . gabapentin (NEURONTIN) 600 MG tablet Take 600 mg by mouth daily.    . hydrochlorothiazide (HYDRODIURIL) 25 MG tablet Take 1 tablet (25 mg total) by mouth daily. 30 tablet 5  . nicotine polacrilex (NICORETTE) 2 MG gum Take 1 each (2 mg total) by mouth as needed for smoking cessation. 100 tablet 1  . sertraline (ZOLOFT) 100 MG tablet Take 100 mg by mouth daily.    . simvastatin (ZOCOR) 20 MG tablet Take 1 tablet (20 mg total) by mouth daily. 30 tablet   . traZODone (DESYREL) 50 MG tablet Take 1 tablet (50 mg total) by mouth at bedtime. 30 tablet 2   No current facility-administered medications for this visit.    Review of  Systems: General: Denies fever, chills, diaphoresis, appetite change and fatigue.  Respiratory: Denies SOB, DOE, cough, and wheezing.   Cardiovascular: Denies chest pain and palpitations.  Gastrointestinal: Denies nausea, vomiting, abdominal pain, and diarrhea.  Genitourinary: Denies dysuria, increased frequency, and flank pain. Endocrine: Denies hot or cold intolerance, polyuria, and polydipsia. Musculoskeletal: Denies myalgias, back pain, joint swelling, arthralgias and gait problem.  Skin: Denies pallor, rash and wounds.  Neurological: Denies dizziness, seizures, syncope, weakness, lightheadedness, numbness and headaches.  Psychiatric/Behavioral: Denies mood changes, and sleep disturbances.  Objective:   Physical Exam: Filed Vitals:   02/25/16 0941  BP: 117/66  Pulse: 91  Temp: 98.2 F (36.8 C)  TempSrc: Oral  Height: 5\' 4"  (1.626 m)  Weight: 134 lb 9.6 oz (61.054 kg)  SpO2: 100%    General: AA female, alert, cooperative, NAD. HEENT: PERRL, EOMI. Moist mucus membranes Neck: Full range of motion without pain, supple, no lymphadenopathy or carotid bruits Lungs: Clear to ascultation bilaterally, normal work of respiration, no wheezes, rales, rhonchi Heart: RRR, no murmurs, gallops, or rubs Abdomen: Soft, non-tender, non-distended, BS + Extremities: No cyanosis, clubbing, or edema Neurologic: Alert & oriented X3, cranial nerves II-XII intact, strength grossly intact, sensation intact to light touch   Assessment & Plan:   Please see problem based assessment and plan.

## 2016-02-25 NOTE — Assessment & Plan Note (Signed)
Had flu shot at [facility] in the fall

## 2016-02-25 NOTE — Patient Instructions (Signed)
1. Please make a follow up appointment for 3 months.   2. Please take all medications as previously prescribed.  I am SO HAPPY about your blood pressure. You are doing a great job!!  Keep it up with the smoking cessation. Congratulations on QUITTING!  3. If you have worsening of your symptoms or new symptoms arise, please call the clinic [phone]), or go to the ER immediately if symptoms are severe.  You have done a great job in taking all your medications. Please continue to do this.

## 2016-02-29 NOTE — Progress Notes (Signed)
Internal Medicine Clinic Attending  Case discussed with Dr. Jones at the time of the visit.  We reviewed the resident's history and exam and pertinent patient test results.  I agree with the assessment, diagnosis, and plan of care documented in the resident's note.  

## 2016-03-28 ENCOUNTER — Other Ambulatory Visit: Payer: Self-pay | Admitting: Internal Medicine

## 2016-03-28 DIAGNOSIS — G4700 Insomnia, unspecified: Secondary | ICD-10-CM

## 2016-03-28 NOTE — Telephone Encounter (Signed)
Last visit 02/25/2016.

## 2016-04-04 ENCOUNTER — Encounter: Payer: Self-pay | Admitting: Internal Medicine

## 2016-04-04 DIAGNOSIS — E559 Vitamin D deficiency, unspecified: Secondary | ICD-10-CM

## 2016-04-04 DIAGNOSIS — F1210 Cannabis abuse, uncomplicated: Secondary | ICD-10-CM | POA: Insufficient documentation

## 2016-04-04 HISTORY — DX: Vitamin D deficiency, unspecified: E55.9

## 2016-04-04 HISTORY — DX: Cannabis abuse, uncomplicated: F12.10

## 2016-04-28 ENCOUNTER — Encounter: Payer: BLUE CROSS/BLUE SHIELD | Admitting: Internal Medicine

## 2016-05-04 ENCOUNTER — Encounter: Payer: Self-pay | Admitting: Internal Medicine

## 2016-05-04 ENCOUNTER — Ambulatory Visit (INDEPENDENT_AMBULATORY_CARE_PROVIDER_SITE_OTHER): Payer: BLUE CROSS/BLUE SHIELD | Admitting: Internal Medicine

## 2016-05-04 VITALS — BP 172/94 | HR 64 | Temp 99.0°F | Wt 129.7 lb

## 2016-05-04 DIAGNOSIS — F17200 Nicotine dependence, unspecified, uncomplicated: Secondary | ICD-10-CM

## 2016-05-04 DIAGNOSIS — I10 Essential (primary) hypertension: Secondary | ICD-10-CM | POA: Diagnosis not present

## 2016-05-04 DIAGNOSIS — J309 Allergic rhinitis, unspecified: Secondary | ICD-10-CM | POA: Diagnosis not present

## 2016-05-04 DIAGNOSIS — E559 Vitamin D deficiency, unspecified: Secondary | ICD-10-CM

## 2016-05-04 DIAGNOSIS — G5603 Carpal tunnel syndrome, bilateral upper limbs: Secondary | ICD-10-CM

## 2016-05-04 DIAGNOSIS — F5105 Insomnia due to other mental disorder: Secondary | ICD-10-CM

## 2016-05-04 DIAGNOSIS — Z0000 Encounter for general adult medical examination without abnormal findings: Secondary | ICD-10-CM

## 2016-05-04 DIAGNOSIS — J302 Other seasonal allergic rhinitis: Secondary | ICD-10-CM

## 2016-05-04 DIAGNOSIS — F418 Other specified anxiety disorders: Secondary | ICD-10-CM

## 2016-05-04 DIAGNOSIS — Z720 Tobacco use: Secondary | ICD-10-CM

## 2016-05-04 HISTORY — DX: Other specified anxiety disorders: F41.8

## 2016-05-04 HISTORY — DX: Allergic rhinitis, unspecified: J30.9

## 2016-05-04 MED ORDER — CETIRIZINE HCL 10 MG PO TABS: 10.0000 mg | ORAL_TABLET | Freq: Every day | ORAL | Status: DC

## 2016-05-04 MED ORDER — GABAPENTIN 600 MG PO TABS: 600.0000 mg | ORAL_TABLET | Freq: Two times a day (BID) | ORAL | Status: DC

## 2016-05-04 MED ORDER — CALCIUM CARBONATE 600 MG PO TABS: 600.0000 mg | ORAL_TABLET | Freq: Two times a day (BID) | ORAL | Status: DC

## 2016-05-04 NOTE — Assessment & Plan Note (Signed)
Assessment  She remains in the pre-contemplative stage regarding smoking cessation although does admit that she would like to quit and has in her possession Nicorette gum. Once she is mentally ready she states she will make a concerted attempt to quit smoking, but given her considerable stress at this time it may not be a productive attempt if she were to try right now.  Plan  We will re-discuss the benefits of smoking cessation at the follow-up visit, especially if her depression and acute bereavement are improved.

## 2016-05-04 NOTE — Assessment & Plan Note (Signed)
At the follow-up visit she wants to discuss the issue of a bone density scan further before deciding upon whether or not to proceed. We will also discuss the issue of a Pap smear although she states she's had one recently and it looks like she's had an abdominal hysterectomy for fibroids. She is also due for a Zostavax and this will be discussed at follow-up. Finally, because of the limited time of the appointment, I was unable to fully explore her past medical and surgical history to the degree that I hoped to nor clarify some aspects of her social history. This will be done at the follow-up visit.

## 2016-05-04 NOTE — Assessment & Plan Note (Signed)
Assessment  When she takes the trazodone she is able to fall asleep. Unfortunately, she will awaken early and have difficulty getting back to sleep. She has learned that if she takes the trazodone later in the evening she will sleep longer during the night.  Plan  We will continue the as needed trazodone for insomnia and reassess her symptoms at the follow-up visit. I am hopeful that with therapy under the guidance of Dr. Casimiro Needle, as well as further time since the unexpected death of her daughter and improvement in her acute bereavement, her insomnia may improve somewhat.

## 2016-05-04 NOTE — Assessment & Plan Note (Signed)
Assessment  She states her allergic rhinitis symptoms are well controlled on the as needed Zyrtec.  Plan  We will continue the Zyrtec 10 mg by mouth daily as needed for her allergic rhinitis. We will reassess her symptoms at the follow-up visit.

## 2016-05-04 NOTE — Patient Instructions (Signed)
It was nice to meet you.  I am looking forward to taking care of you for years to come.  1) I am sorry to hear about your daughter's death.  It will take time to accept it.  In the meantime keep taking the sertraline and see Dr. Casimiro Needle as scheduled.  I think he will help you.  2) I added the calcium back to your medications.  Otherwise take everything as you are.  I will see you back in 3 months, sooner if necessary.

## 2016-05-04 NOTE — Assessment & Plan Note (Signed)
Assessment  Her carpal tunnel syndrome is well controlled symptomatically in both hands. She states this is with the use of wrist splints on an as-needed basis.  Plan  We will continue with the as needed wrist splints for her carpal tunnel syndrome and reassess her symptomatology at the follow-up visit.

## 2016-05-04 NOTE — Assessment & Plan Note (Signed)
Assessment  Her blood pressure today was elevated at 172/94. This is on amlodipine 10 mg by mouth daily and hydrochlorothiazide 25 mg by mouth daily. The most recent blood pressure was excellent at 117/66. I am not sure the cause of this elevated blood pressure today but rather than change her blood pressure regimen we will reassess it at the follow-up visit given the stress that she's been under with her daughter's recent death.  Plan  Continue the amlodipine at 10 mg by mouth daily and hydrochlorothiazide at 25 mg by mouth daily. We will reassess the blood pressure at the follow-up visit as well as obtain a basic metabolic panel to assure the kidney function and potassium remained normal on the hydrochlorothiazide. If she remains with a blood pressure greater than 140/90 we will add an additional agent. I am leaning towards an ACE inhibitor as she has no specific other comorbidities at this time which would dictate a different class of antihypertensive.

## 2016-05-04 NOTE — Assessment & Plan Note (Signed)
Assessment  She states she's been compliant with her vitamin D supplementation.  Plan  We will check a vitamin D level at the follow-up visit to make sure she is appropriately replete. In the meantime she was asked to continue with the vitamin D supplementation.

## 2016-05-04 NOTE — Assessment & Plan Note (Signed)
Assessment  Mia Underwood is depressed today and has been so for the last 3 months since the unexpected death of her 64 year old daughter whom she says died of a acute myocardial infarction. She is also having difficulty with her mother whom she never seems to be able to satisfy. She feels her family is trying to burden her with all their troubles as she tries to remain upbeat, at least as much she can. She tries to keep her mind occupied and reads a lot which she finds enjoyable. She is currently on sertraline 100 mg by mouth daily. She's unsure if this is helpful. She has not spoken to her psychologist in nearly a year as this provider apparently has left town. That being said, she is very interested in having psychological evaluation and ongoing therapy.  Plan  She has an appointment scheduled with Mia Underwood and she was encouraged to keep this appointment to establish care with him. In the meantime, we will continue with the sertraline 100 mg by mouth daily. We will also continue with the trazodone for the insomnia. We will reassess her depressive symptoms that I believe are partially related to acute bereavement at the return appointment after she's had a few more months to come to grips with her daughter's recent death and has had the opportunity to see Mia Underwood.

## 2016-05-04 NOTE — Progress Notes (Signed)
   Subjective:    Patient ID: Mia Underwood, female    DOB: [birth year], 65 y.o.   MRN: [id number]  HPI  Mia Underwood is here for follow-up of her depression, hypertension, and hyperlipidemia. Please see the A&P for the status of the pt's chronic medical problems.  Her only acute complaint today is muscle cramping. The cramping occurs mainly in her left calf. It is not associated with any particular position or activity. She does state that it has worsened since she stopped her calcium supplementation. She specifically denies any trauma or lower extremity skin changes. She is without other acute complaints.  Review of Systems  Constitutional: Negative for activity change, appetite change and unexpected weight change.  HENT: Negative for congestion, postnasal drip, rhinorrhea and sinus pressure.   Eyes: Negative for itching.  Respiratory: Negative for chest tightness, shortness of breath and wheezing.   Cardiovascular: Negative for chest pain, palpitations and leg swelling.  Gastrointestinal: Negative for nausea, vomiting, abdominal pain, diarrhea, constipation and abdominal distention.  Musculoskeletal: Positive for myalgias.       Left calf cramping  Skin: Negative for color change, rash and wound.  Allergic/Immunologic: Positive for environmental allergies.  Psychiatric/Behavioral: Positive for sleep disturbance, dysphoric mood and decreased concentration. The patient is nervous/anxious.       Objective:   Physical Exam  Constitutional: She is oriented to person, place, and time. She appears well-developed and well-nourished. No distress.  HENT:  Head: Normocephalic and atraumatic.  Eyes: Conjunctivae are normal. Right eye exhibits no discharge. Left eye exhibits no discharge. No scleral icterus.  Cardiovascular: Normal rate and regular rhythm.  Exam reveals no gallop and no friction rub.   No murmur heard. Pulmonary/Chest: Effort normal and breath sounds normal. No respiratory  distress. She has no wheezes. She has no rales.  Abdominal: Soft. Bowel sounds are normal. She exhibits no distension. There is no tenderness. There is no rebound and no guarding.  Musculoskeletal: Normal range of motion. She exhibits tenderness. She exhibits no edema.  Left calf tenderness on palpation  Neurological: She is alert and oriented to person, place, and time. She exhibits normal muscle tone.  Skin: Skin is warm and dry. No rash noted. She is not diaphoretic. No erythema.  Psychiatric: Her speech is normal and behavior is normal. Judgment and thought content normal. Her mood appears not anxious. Her affect is not angry, not blunt, not labile and not inappropriate. Cognition and memory are normal. She exhibits a depressed mood.  Intermittent tears when discussing the recent death of her daughter and difficulties she is having with her mother, who never seems satisfied with the patient.  Nursing note and vitals reviewed.     Assessment & Plan:   Please see problem oriented charting.

## 2016-06-02 ENCOUNTER — Ambulatory Visit (HOSPITAL_COMMUNITY): Payer: BLUE CROSS/BLUE SHIELD | Admitting: Psychiatry

## 2016-07-10 ENCOUNTER — Ambulatory Visit (INDEPENDENT_AMBULATORY_CARE_PROVIDER_SITE_OTHER): Payer: BLUE CROSS/BLUE SHIELD | Admitting: Licensed Clinical Social Worker

## 2016-07-10 ENCOUNTER — Encounter (HOSPITAL_COMMUNITY): Payer: Self-pay | Admitting: Licensed Clinical Social Worker

## 2016-07-10 ENCOUNTER — Encounter (INDEPENDENT_AMBULATORY_CARE_PROVIDER_SITE_OTHER): Payer: Self-pay

## 2016-07-10 DIAGNOSIS — F418 Other specified anxiety disorders: Secondary | ICD-10-CM

## 2016-07-10 DIAGNOSIS — F4321 Adjustment disorder with depressed mood: Secondary | ICD-10-CM

## 2016-07-10 DIAGNOSIS — Z634 Disappearance and death of family member: Principal | ICD-10-CM
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
## 2016-07-18 ENCOUNTER — Ambulatory Visit (HOSPITAL_COMMUNITY): Payer: Self-pay | Admitting: Licensed Clinical Social Worker

## 2016-07-20 ENCOUNTER — Ambulatory Visit (INDEPENDENT_AMBULATORY_CARE_PROVIDER_SITE_OTHER): Payer: BLUE CROSS/BLUE SHIELD | Admitting: Licensed Clinical Social Worker

## 2016-07-20 ENCOUNTER — Encounter (HOSPITAL_COMMUNITY): Payer: Self-pay | Admitting: Licensed Clinical Social Worker

## 2016-07-20 DIAGNOSIS — F4321 Adjustment disorder with depressed mood: Secondary | ICD-10-CM

## 2016-07-20 DIAGNOSIS — Z634 Disappearance and death of family member: Principal | ICD-10-CM

## 2016-07-20 DIAGNOSIS — F418 Other specified anxiety disorders: Secondary | ICD-10-CM

## 2016-07-20 NOTE — Progress Notes (Signed)
   THERAPIST PROGRESS NOTE  Session Time: 3:15-4:00pm  Participation Level: Active  Behavioral Response: CasualAlertAnxious  Type of Therapy: Individual Therapy  Treatment Goals addressed: Coping  Interventions: Supportive  Summary: Mia Underwood is a 65 y.o. female who presents  Less sad and tearful today. She is struggling with the loss of her daughter. She is having some issues with the care of her grandchildren and their father is not responding to contact. She cannot enroll them in school. She has contacted DSS for assistance. 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 Discussed at length about self care and the reason she needs to put herself first. PT was in agreement about doing a better job of self-care.  Suicidal/Homicidal: Nowithout intent/plan  Therapist Response: Assessed pt's current functioning and reviewed progress. Assisted pt processing issues with loss, control and processing for management of stressors.  Plan: Return again in 2 weeks.  Diagnosis: Axis I: Depression with anxiety        Haylen Shelnutt S, LCAS-A 07/20/2016

## 2016-08-02 ENCOUNTER — Telehealth: Payer: Self-pay | Admitting: Internal Medicine

## 2016-08-02 NOTE — Telephone Encounter (Signed)
APT. REMINDER CALL, LMTCB °

## 2016-08-03 ENCOUNTER — Ambulatory Visit (INDEPENDENT_AMBULATORY_CARE_PROVIDER_SITE_OTHER): Payer: BLUE CROSS/BLUE SHIELD | Admitting: Internal Medicine

## 2016-08-03 ENCOUNTER — Encounter: Payer: Self-pay | Admitting: Internal Medicine

## 2016-08-03 VITALS — BP 181/79 | HR 64 | Temp 98.3°F | Wt 129.6 lb

## 2016-08-03 DIAGNOSIS — E785 Hyperlipidemia, unspecified: Secondary | ICD-10-CM

## 2016-08-03 DIAGNOSIS — F17210 Nicotine dependence, cigarettes, uncomplicated: Secondary | ICD-10-CM

## 2016-08-03 DIAGNOSIS — E559 Vitamin D deficiency, unspecified: Secondary | ICD-10-CM

## 2016-08-03 DIAGNOSIS — I10 Essential (primary) hypertension: Secondary | ICD-10-CM | POA: Diagnosis not present

## 2016-08-03 DIAGNOSIS — F418 Other specified anxiety disorders: Secondary | ICD-10-CM

## 2016-08-03 DIAGNOSIS — F5105 Insomnia due to other mental disorder: Secondary | ICD-10-CM

## 2016-08-03 DIAGNOSIS — Z0000 Encounter for general adult medical examination without abnormal findings: Secondary | ICD-10-CM

## 2016-08-03 DIAGNOSIS — Z720 Tobacco use: Secondary | ICD-10-CM

## 2016-08-03 DIAGNOSIS — Z90710 Acquired absence of both cervix and uterus: Secondary | ICD-10-CM

## 2016-08-03 MED ORDER — AMLODIPINE BESYLATE 10 MG PO TABS: 10.0000 mg | ORAL_TABLET | Freq: Every day | ORAL | 3 refills | Status: DC

## 2016-08-03 MED ORDER — HYDROCHLOROTHIAZIDE 25 MG PO TABS: 25.0000 mg | ORAL_TABLET | Freq: Every day | ORAL | 3 refills | Status: DC

## 2016-08-03 NOTE — Patient Instructions (Addendum)
It was good to see you again!  1) I will call you next week with the results of the labs we drew.  2) We gave you the flu shot today.  3) Keep taking the medications as you are.  4) I refilled the hydrochlorothiazide and the amlodipine.  5) Follow-up with Dr. Casimiro Needle on October 11th as scheduled.  I will see you back in 3 months, sooner if necessary.

## 2016-08-03 NOTE — Assessment & Plan Note (Signed)
Assessment  She continues to have difficulty maintaining sleep. This is most likely related to her anxiety and depression. She is unsure if the trazodone is working.  Plan  She was encouraged to follow-up with Dr. Casimiro Needle at the scheduled appointment on October 11 as he may want to adjust her antidepressant and anti-anxiety regimen. We will reassess her insomnia at the follow-up visit, presumably on a modified antidepressant and anti-anxiety regimen.

## 2016-08-03 NOTE — Assessment & Plan Note (Addendum)
Assessment  Her blood pressure today was elevated at 181/79. She has been prescribed hydrochlorothiazide 25 mg by mouth daily and amlodipine 10 mg by mouth daily. Unfortunately, she states she has been out of the amlodipine for a few months. Thus, this blood pressure only reflects the hydrochlorothiazide 25 mg by mouth daily. A basic metabolic panel was drawn during this visit and is pending at the time of this dictation.  Plan  Both the hydrochlorothiazide at 25 mg by mouth daily and amlodipine at 10 mg by mouth daily were represcribed and sent to her pharmacy. She was encouraged to take both of these medications for her blood pressure as control was important. We will reassess her blood pressure control at the follow-up visit and make further adjustments as needed. We will follow-up on the basic metabolic panel drawn today to assure that her renal function is stable and her potassium level is within the normal range.

## 2016-08-03 NOTE — Progress Notes (Signed)
   Subjective:    Patient ID: Mia Underwood, female    DOB: 30-Dec-1950, 65 y.o.   MRN: [id number]  HPI  Mia Underwood is here for Depression and anxiety, hypertension, tobacco abuse, and vitamin D deficiency. Please see the A&P for the status of the pt's chronic medical problems.  Review of Systems  Constitutional: Negative for activity change, appetite change and unexpected weight change.  Cardiovascular: Negative for palpitations and leg swelling.  Gastrointestinal: Negative for abdominal pain, constipation, diarrhea, nausea and vomiting.  Psychiatric/Behavioral: Positive for dysphoric mood and sleep disturbance. The patient is nervous/anxious.       Objective:   Physical Exam  Constitutional: She is oriented to person, place, and time. She appears well-developed and well-nourished. No distress.  HENT:  Head: Normocephalic and atraumatic.  Eyes: Conjunctivae are normal. Right eye exhibits no discharge. Left eye exhibits no discharge. No scleral icterus.  Musculoskeletal: Normal range of motion. She exhibits no tenderness.  Neurological: She is alert and oriented to person, place, and time.  Skin: She is not diaphoretic.  Psychiatric: Her speech is normal. Judgment and thought content normal. Her mood appears anxious. She is hyperactive. Cognition and memory are normal.  She was in near constant motion, continuously rubbing her legs.  Most of her complaints revolved around her mothers treatment of her and her family's expectation that she solve their problems for them.  This has continued to cause her considerable stress.  Nursing note and vitals reviewed.     Assessment & Plan:   Please see problem oriented charting.

## 2016-08-03 NOTE — Assessment & Plan Note (Signed)
Assessment  She states she is taking the simvastatin at 20 mg by mouth daily and is not experiencing any myalgias. She is interested in her lipid panel today and this was drawn. The results are pending at the time of this dictation.  Plan  We will continue the simvastatin at 20 mg by mouth daily and follow-up on the results of the lipid panel. Further adjustments in her regimen are pending the results of the lipid panel.

## 2016-08-03 NOTE — Assessment & Plan Note (Signed)
Assessment  She continues to be anxious and depressed which is resulting in sleep disturbance. Most of this revolves around her relationship with her mother who she claims has treated her poorly for years and blames her for messing up her life by being born when she was young (age 65). At this point she states her mother will say things that are hurtful to her and she can never do enough to please her. Other stressors include family members expecting her to solve their problems, per her report. For example, her deceased daughter's high school children will call her when they need something rather than addressing it with their father. She reminds them of the need to address these issues with their father. She has also had some difficulties in dealing with her brother who will ask for alcohol, cigarettes, and borrow money and yet, she feels he is unappreciative. She likes her new therapist, but has not seen Dr. Casimiro Needle as of yet. She is unsure if the sertraline or trazodone are helping her.  Plan  We will continue the sertraline at 100 mg daily and trazodone at 50 mg at night as needed for sleep. She will continue to follow-up with her therapist and has an appointment with Dr. Casimiro Needle on October 11. She was encouraged to make that appointment in case he feels further changes in her anti-depression and anxiety regimen are required. We will reassess her handling of her stressors at the follow-up visit.

## 2016-08-03 NOTE — Assessment & Plan Note (Signed)
Assessment  She continues to take her supplemental vitamin D. A vitamin D level was drawn at this appointment and is pending at this time.  Plan  We will follow-up on the vitamin D level to see if she is now in the normal range. Further adjustments are pending the results of this study.

## 2016-08-03 NOTE — Assessment & Plan Note (Signed)
She received the flu shot today. At the follow-up visit we will discuss the Zostavax. With regards to Pap smear she's had a hysterectomy for fibroids in the past and therefore this is no longer necessary. Given the limited amount of time during the visit I will need to clarify whether or not she has ever used smokeless tobacco and certain aspects of her past medical and surgical history at the follow-up visit.

## 2016-08-03 NOTE — Assessment & Plan Note (Signed)
Assessment  She remains in the pre-contemplative stage and is not quite ready to quit smoking because of all of the stressors she currently is experiencing. She will let me know when she is ready to quit.  Plan  Four minutes were spent discussing the importance of tobacco cessation on her overall health.

## 2016-08-04 LAB — LIPID PANEL
CHOLESTEROL TOTAL: 187 mg/dL (ref 100–199)
Chol/HDL Ratio: 2.1 ratio units (ref 0.0–4.4)
HDL: 90 mg/dL (ref >39)
LDL Calculated: 84 mg/dL (ref 0–99)
Triglycerides: 66 mg/dL (ref 0–149)
VLDL Cholesterol Cal: 13 mg/dL (ref 5–40)

## 2016-08-04 LAB — BMP8+ANION GAP
ANION GAP: 19 mmol/L — AB (ref 10.0–18.0)
BUN / CREAT RATIO: 23 (ref 12–28)
BUN: 18 mg/dL (ref 8–27)
CHLORIDE: 99 mmol/L (ref 96–106)
CO2: 23 mmol/L (ref 18–29)
CREATININE: 0.78 mg/dL (ref 0.57–1.00)
Calcium: 9.9 mg/dL (ref 8.7–10.3)
GFR calc Af Amer: 94 mL/min/{1.73_m2} (ref >59)
GFR calc non Af Amer: 81 mL/min/{1.73_m2} (ref >59)
GLUCOSE: 87 mg/dL (ref 65–99)
POTASSIUM: 4.2 mmol/L (ref 3.5–5.2)
SODIUM: 141 mmol/L (ref 134–144)

## 2016-08-04 LAB — VITAMIN D 25 HYDROXY (VIT D DEFICIENCY, FRACTURES): VIT D 25 HYDROXY: 29 ng/mL — AB (ref 30.0–100.0)

## 2016-08-07 ENCOUNTER — Encounter (HOSPITAL_COMMUNITY): Payer: Self-pay | Admitting: Licensed Clinical Social Worker

## 2016-08-07 ENCOUNTER — Ambulatory Visit (INDEPENDENT_AMBULATORY_CARE_PROVIDER_SITE_OTHER): Payer: BLUE CROSS/BLUE SHIELD | Admitting: Licensed Clinical Social Worker

## 2016-08-07 DIAGNOSIS — Z634 Disappearance and death of family member: Principal | ICD-10-CM

## 2016-08-07 DIAGNOSIS — F4321 Adjustment disorder with depressed mood: Secondary | ICD-10-CM

## 2016-08-07 DIAGNOSIS — F418 Other specified anxiety disorders: Secondary | ICD-10-CM
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
## 2016-08-08 ENCOUNTER — Telehealth: Payer: Self-pay | Admitting: Internal Medicine

## 2016-08-08 NOTE — Telephone Encounter (Signed)
I tried calling yesterday but received an unidentified voice mail so no message was left.  I am glad she called today.  I called her back and updated her on her lab results.  Things look very good and I congratulated her on doing an excellent job caring for herself.  No changes are needed in her regimen.

## 2016-08-08 NOTE — Progress Notes (Signed)
Patient ID: Mia Underwood, female   DOB: 1951-07-24, 65 y.o.   MRN: [id number]  BMP: K 4.2, eGFR 94  No adverse electrolyte disturbances from the HCTZ.  We will continue at 25 mg daily.  Vitamin D 29 (Insufficiency on 1000 units daily).    I discussed this with the patient and we decided to continue the vitamin D supplementation at this dose as the level was just below the cutoff for normal (30).  Total cholesterol 187 Triglycerides 66 HDL 90 LDL 84  Excellent lipid panel on the moderate intensity statin which she is tolerating well.  We will continue at this dose.

## 2016-08-08 NOTE — Telephone Encounter (Signed)
Pt states she was returning a calling to dr Eppie Gibson about results

## 2016-08-28 ENCOUNTER — Ambulatory Visit (INDEPENDENT_AMBULATORY_CARE_PROVIDER_SITE_OTHER): Payer: BLUE CROSS/BLUE SHIELD | Admitting: Licensed Clinical Social Worker

## 2016-08-28 DIAGNOSIS — F418 Other specified anxiety disorders: Secondary | ICD-10-CM | POA: Diagnosis not present

## 2016-08-29 ENCOUNTER — Encounter (HOSPITAL_COMMUNITY): Payer: Self-pay | Admitting: Licensed Clinical Social Worker

## 2016-08-29 NOTE — Progress Notes (Signed)
THERAPIST PROGRESS NOTE  Session Time: 3:10-4pm  Participation Level: Active  Behavioral Response: CasualAlert  Type of Therapy: Individual Therapy  Treatment Goals addressed: Coping  Interventions: CBT  Summary: Mia Underwood is a 65 y.o. female who presents with depression. Pt seemed more calm today. Pt is working on her moods and what she can/cannot control.Pt has established a relationship with her younger granddaughter who is in counseling and seems to be somewhat adjusting, since the loss of her mother. Pt is trying to get up, get dressed and have engagement with neighbors and friends. Pt has begun walking but is doing no more exercise.PT continues to have a poor relationship with her mother but has tried to accept her mother as she is not going to change. Role-played using some boundaries with her mother. Pt is still considering  hospice for grief support groups as well as IOP. Pt was receptive to suggestions and interventions.  Suicidal/Homicidal: Nowithout intent/plan  Therapist Response: Assessed [pt's current functioning and reviewed progress. Assisted pt processing issues with family, grief, boundaries and processing for management of stressors.  Plan: Return again in 2 weeks with CBT  Diagnosis:      Axis I: Grief at loss of child, depression and anxiety                              [name],LISBETH S, LCAS-A

## 2016-09-06 ENCOUNTER — Encounter (HOSPITAL_COMMUNITY): Payer: Self-pay | Admitting: Psychiatry

## 2016-09-06 ENCOUNTER — Ambulatory Visit (HOSPITAL_COMMUNITY): Payer: BLUE CROSS/BLUE SHIELD | Admitting: Psychiatry

## 2016-09-06 VITALS — BP 148/90 | HR 79 | Ht 61.0 in | Wt 126.8 lb

## 2016-09-06 DIAGNOSIS — F17210 Nicotine dependence, cigarettes, uncomplicated: Secondary | ICD-10-CM

## 2016-09-06 DIAGNOSIS — Z818 Family history of other mental and behavioral disorders: Secondary | ICD-10-CM

## 2016-09-06 DIAGNOSIS — F339 Major depressive disorder, recurrent, unspecified: Secondary | ICD-10-CM

## 2016-09-06 DIAGNOSIS — Z8261 Family history of arthritis: Secondary | ICD-10-CM

## 2016-09-06 DIAGNOSIS — Z803 Family history of malignant neoplasm of breast: Secondary | ICD-10-CM

## 2016-09-06 DIAGNOSIS — Z7982 Long term (current) use of aspirin: Secondary | ICD-10-CM

## 2016-09-06 DIAGNOSIS — Z79899 Other long term (current) drug therapy: Secondary | ICD-10-CM | POA: Diagnosis not present

## 2016-09-06 DIAGNOSIS — Z806 Family history of leukemia: Secondary | ICD-10-CM

## 2016-09-06 DIAGNOSIS — Z833 Family history of diabetes mellitus: Secondary | ICD-10-CM

## 2016-09-06 MED ORDER — TRAZODONE HCL 50 MG PO TABS: ORAL_TABLET | ORAL | 5 refills | Status: DC

## 2016-09-06 MED ORDER — SERTRALINE HCL 100 MG PO TABS: ORAL_TABLET | ORAL | 5 refills | Status: DC
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
## 2016-09-11 ENCOUNTER — Ambulatory Visit (INDEPENDENT_AMBULATORY_CARE_PROVIDER_SITE_OTHER): Payer: BLUE CROSS/BLUE SHIELD | Admitting: Licensed Clinical Social Worker

## 2016-09-11 ENCOUNTER — Encounter (HOSPITAL_COMMUNITY): Payer: Self-pay | Admitting: Licensed Clinical Social Worker

## 2016-09-11 DIAGNOSIS — F339 Major depressive disorder, recurrent, unspecified: Secondary | ICD-10-CM | POA: Diagnosis not present

## 2016-09-11 NOTE — Progress Notes (Signed)
THERAPIST PROGRESS NOTE  Session Time: 2:10-3 pm  Participation Level: Active  Behavioral Response: CasualAlert  Type of Therapy: Individual Therapy  Treatment Goals addressed: Coping  Interventions: CBT  Summary: Mia Underwood is a 65 y.o. female who presents with depression. Pt seemed more calm today. Pt talked about her daily routine. Pt is being encouraged to get dressed and go out daily. She has been getting dressed and going out more days than in the past. Pt kept her granddaughter's dog over the weekend. It made her want to get a dog. She shared it would be would help her with a purpose, reason to get out of bed and take walks multiple times per day. Encouraged pt to follow through with [facility]. Pt continues to have a poor relationship with her demented mother but has tried to accept her mother as she is not going to change. Role-played using some boundaries with her family who seem to depend upon her. Pt agreed to call  hospice for grief support groups. Pt was receptive to suggestions and interventions.  Suicidal/Homicidal: Nowithout intent/plan  Therapist Response: Assessed [pt's current functioning and reviewed progress. Assisted pt processing issues with family, grief, boundaries and processing for management of stressors.  Plan: Return again in 2 weeks with CBT  Diagnosis:      Axis I: Major Depression, recurent, chronic                              Teia Freitas S, LCAS

## 2016-09-25 ENCOUNTER — Ambulatory Visit (HOSPITAL_COMMUNITY): Payer: Self-pay | Admitting: Licensed Clinical Social Worker

## 2016-10-02 ENCOUNTER — Ambulatory Visit (HOSPITAL_COMMUNITY): Payer: Self-pay | Admitting: Licensed Clinical Social Worker

## 2016-10-04 ENCOUNTER — Ambulatory Visit (INDEPENDENT_AMBULATORY_CARE_PROVIDER_SITE_OTHER): Payer: BLUE CROSS/BLUE SHIELD | Admitting: Licensed Clinical Social Worker

## 2016-10-04 DIAGNOSIS — F418 Other specified anxiety disorders: Secondary | ICD-10-CM | POA: Diagnosis not present

## 2016-10-04 DIAGNOSIS — F339 Major depressive disorder, recurrent, unspecified: Secondary | ICD-10-CM

## 2016-10-04 NOTE — Progress Notes (Signed)
THERAPIST PROGRESS NOTE  Session Time: 1:10-2 pm  Participation Level: Active  Behavioral Response: CasualAlert/Depressed  Type of Therapy: Individual Therapy  Treatment Goals addressed: Coping  Interventions: CBT  Summary: Mia Underwood is a 65 y.o. female who presents with depression. Pt appeared depressed today. Pt reports she did not sleep last night. She hadn't eaten all day, so I gave her a snack. Discussed pt's medication for sleep - Trazadone. She says she takes it as prescribed but she feels very foggy in the mornings. Pt talked about her daily routine. Pt is being encouraged to get dressed and go out daily, however sometimes she stays in her pjs. Pulled up a guided meditation on youtube and she was able to use the meditation to relax. Encouraged her to continue to use meditation as part of her self-care. The house that Pt's daughter owned is going in forclosure at the end of the month. Processed with pt about closure. Pt is still grieving the loss of her daughter and has yet to follow through with Hospice. Pr reported she benefited by coming in today for a therapy session.   Suicidal/Homicidal: Nowithout intent/plan  Therapist Response: Assessed pt's current functioning and reviewed progress. Assisted pt processing issues with family, grief, boundaries and processing for management of stressors.  Plan: Return again in 2 weeks with CBT  Diagnosis:      Axis I: Major Depression, recurent, chronic                              Eshan Trupiano S, LCAS

## 2016-10-05 ENCOUNTER — Encounter (HOSPITAL_COMMUNITY): Payer: Self-pay | Admitting: Licensed Clinical Social Worker

## 2016-10-17 ENCOUNTER — Ambulatory Visit (HOSPITAL_COMMUNITY): Payer: Self-pay | Admitting: Licensed Clinical Social Worker

## 2016-11-15 ENCOUNTER — Ambulatory Visit (INDEPENDENT_AMBULATORY_CARE_PROVIDER_SITE_OTHER): Payer: BLUE CROSS/BLUE SHIELD | Admitting: Licensed Clinical Social Worker

## 2016-11-15 DIAGNOSIS — F339 Major depressive disorder, recurrent, unspecified: Secondary | ICD-10-CM | POA: Diagnosis not present

## 2016-11-15 DIAGNOSIS — Z634 Disappearance and death of family member: Secondary | ICD-10-CM | POA: Diagnosis not present

## 2016-11-15 DIAGNOSIS — F4321 Adjustment disorder with depressed mood: Secondary | ICD-10-CM

## 2016-11-15 NOTE — Progress Notes (Signed)
THERAPIST PROGRESS NOTE  Session Time: 1:10-2 pm  Participation Level: Active  Behavioral Response: CasualAlert/Depressed  Type of Therapy: Individual Therapy  Treatment Goals addressed: Coping  Interventions: CBT  Summary: Mia Underwood is a 65 y.o. female who presents with depression. Pt appeared depressed today. This is the 1st holiday without her daughter and she is sad. Processed this with pt. Pt cried and spoke at length about her daughter. Nothing new has happened with her grandaughters. One is still running the streets unsupervised and the 64 yo is living with relatives but her father has not contacted social security so everything is in limbo. Pt wants the 63 yo to move in with her. Suggested to pt to contact an atty and let them work out all the details.Pt  Shared she wants to move out of housing and rent a house or apt. To have a new start. Processed with pt about closure. Pt is still grieving the loss of her daughter and has yet to follow through with Hospice. Ptreported she benefited by coming in today for a therapy session.   Suicidal/Homicidal: Nowithout intent/plan  Therapist Response: Assessed pt's current functioning and reviewed progress. Assisted pt processing issues with family, grief, and processing for management of stressors.  Plan: Return again in 2 weeks with CBT  Diagnosis:      Axis I: Major Depression, recurent, chronic                              Anaalicia Reimann S, LCAS

## 2016-11-16 ENCOUNTER — Ambulatory Visit (INDEPENDENT_AMBULATORY_CARE_PROVIDER_SITE_OTHER): Payer: BLUE CROSS/BLUE SHIELD | Admitting: Psychiatry

## 2016-11-16 ENCOUNTER — Encounter (HOSPITAL_COMMUNITY): Payer: Self-pay | Admitting: Licensed Clinical Social Worker

## 2016-11-16 ENCOUNTER — Encounter (HOSPITAL_COMMUNITY): Payer: Self-pay | Admitting: Psychiatry

## 2016-11-16 VITALS — BP 140/66 | HR 66 | Ht 62.0 in | Wt 122.4 lb

## 2016-11-16 DIAGNOSIS — Z818 Family history of other mental and behavioral disorders: Secondary | ICD-10-CM | POA: Diagnosis not present

## 2016-11-16 DIAGNOSIS — F325 Major depressive disorder, single episode, in full remission: Secondary | ICD-10-CM | POA: Diagnosis not present

## 2016-11-16 DIAGNOSIS — Z8249 Family history of ischemic heart disease and other diseases of the circulatory system: Secondary | ICD-10-CM

## 2016-11-16 DIAGNOSIS — F17210 Nicotine dependence, cigarettes, uncomplicated: Secondary | ICD-10-CM

## 2016-11-16 DIAGNOSIS — Z79899 Other long term (current) drug therapy: Secondary | ICD-10-CM

## 2016-11-16 DIAGNOSIS — Z880 Allergy status to penicillin: Secondary | ICD-10-CM

## 2016-11-16 DIAGNOSIS — Z803 Family history of malignant neoplasm of breast: Secondary | ICD-10-CM | POA: Diagnosis not present

## 2016-11-16 DIAGNOSIS — Z888 Allergy status to other drugs, medicaments and biological substances status: Secondary | ICD-10-CM

## 2016-11-16 MED ORDER — SERTRALINE HCL 100 MG PO TABS: ORAL_TABLET | ORAL | 5 refills | Status: DC

## 2016-11-16 NOTE — Progress Notes (Signed)
Psychiatric Initial Adult Assessment   Patient Identification: Mia Underwood MRN:  [id number] Date of Evaluation:  11/16/2016 Referral [facility]: [facility] Chief Complaint:   Chief Complaint    Follow-up     Visit Diagnosis: Major depression  History of Present Illness:   Today the patient is doing somewhat better. She denies daily persistent depression. Her sleep and appetite normalized. The patient is a 64 year old granddaughter is a good student is doing fairly well. Her other grandson was incarcerated for 7 years and her other granddaughter lives with friends. The patient is enjoying reading. He enjoys TV and movies. She doesn't really drink much   as I initially thought. Patient smokes marijuana once in the lumen. The patient denies being suicidal. She continues in therapy. The patient actually doesn't take the trazodone every night for sleep and generally is to be sleeping. Her appetite is good. She's got good energy. She's trying to be optimistic. She is looking forward to Christmas. At this time she's had a number failed relationships with men. She got IV by herself at this time. She is no evidence of psychosis. The patient is handling Christmas fairly well.  Depression Symptoms:  depressed mood, (Hypo) Manic Symptoms:   Anxiety Symptoms:   Psychotic Symptoms:   PTSD Symptoms:   Past Psychiatric History:   Previous Psychotropic Medications: Yes   Substance Abuse History in the last 12 months:  Yes.    Consequences of Substance Abuse:   Past Medical History:  Past Medical History:  Diagnosis Date  . Allergic rhinitis 05/04/2016  . Bilateral carpal tunnel syndrome 10/18/2010   Left > Right   . Depression with anxiety 09/14/2009  . Diverticulosis of sigmoid colon 06/07/2010  . Essential hypertension 09/14/2009  . Hyperlipidemia 09/14/2009  . 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 Marijuana abuse 04/04/2016  . Tobacco abuse 01/17/2016  . Vitamin D  deficiency 04/04/2016    Past Surgical History:  Procedure Laterality Date  . ABDOMINAL HYSTERECTOMY  April 1996   For fibroids    Family Psychiatric History:   Family History:  Family History  Problem Relation Age of Onset  . Breast cancer Mother   . Depression Mother   . Hypertension Mother   . Dementia Mother   . Alcoholism Father     Died in his 37's related to alcohol abuse  . Leukemia Sister 8  . Early death Brother     Died in a motor vehicle accident  . Heart attack Daughter 69    Per patient report, specifics unknown  . Diabetes Mellitus II Maternal Grandmother   . Diabetes Mellitus II Paternal Grandmother   . Healthy Sister   . Healthy Sister   . Arthritis Brother   . Healthy Brother   . Healthy Brother   . Healthy Son     Social History:   Social History   Social History  . Marital status: Single    Spouse name: N/A  . Number of children: 2  . Years of education: 12 grade   Occupational History  . 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 Smoking status: Current Every Day Smoker    Packs/day: 0.20    Types: Cigarettes    Start date: 12/02/1973  . Smokeless tobacco: Never Used     Comment: Stopped in 1980 restarted in 1985. 1pack every 8 days.  Stopped on 01/20/2016. Restarted again.  . Alcohol use 14.4 oz/week  12 Standard drinks or equivalent, 12 Cans of beer per week  . Drug use:     Types: Marijuana     Comment: Occasional marijuana, 2-3 times a month  . Sexual activity: No   Other Topics Concern  . None   Social History Narrative   Graduated from [employer] in [place].  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    Additional Social History:   Allergies:   Allergies  Allergen Reactions  . Penicillins Hives  . Naproxen Rash    Metabolic Disorder Labs: No results found for: HGBA1C, MPG No results found for: PROLACTIN Lab Results  Component Value Date   CHOL 187 08/03/2016   TRIG 66  08/03/2016   HDL 90 08/03/2016   CHOLHDL 2.1 08/03/2016   VLDL 20 11/01/2010   LDLCALC 84 08/03/2016   LDLCALC 127 (H) 01/03/2016     Current Medications: Current Outpatient Prescriptions  Medication Sig Dispense Refill  . amLODipine (NORVASC) 10 MG tablet Take 1 tablet (10 mg total) by mouth daily. 90 tablet 3  . aspirin EC 81 MG tablet Take 81 mg by mouth daily.    . calcium carbonate (OS-CAL) 600 MG TABS tablet Take 1 tablet (600 mg total) by mouth 2 (two) times daily with a meal. (Patient not taking: Reported on 08/03/2016) 180 tablet 3  . cetirizine (ZYRTEC) 10 MG tablet Take 1 tablet (10 mg total) by mouth at bedtime. 90 tablet 3  . cholecalciferol (VITAMIN D) 1000 UNITS tablet Take 1,000 Units by mouth daily.    Marland Kitchen gabapentin (NEURONTIN) 600 MG tablet Take 1 tablet (600 mg total) by mouth 2 (two) times daily. 180 tablet 3  . hydrochlorothiazide (HYDRODIURIL) 25 MG tablet Take 1 tablet (25 mg total) by mouth daily. 90 tablet 3  . nicotine polacrilex (NICORETTE) 2 MG gum Take 1 each (2 mg total) by mouth as needed for smoking cessation. (Patient not taking: Reported on 08/03/2016) 100 tablet 1  . sertraline (ZOLOFT) 100 MG tablet One  and  a half 45 tablet 5  . simvastatin (ZOCOR) 20 MG tablet Take 1 tablet (20 mg total) by mouth daily. 30 tablet   . traZODone (DESYREL) 50 MG tablet 2  qhs 60 tablet 5   No current facility-administered medications for this visit.     Neurologic: Headache: No Seizure: No Paresthesias:No  Musculoskeletal: Strength & Muscle Tone: within normal limits Gait & Station: normal Patient leans: N/A  Psychiatric Specialty Exam: ROS  Blood pressure 140/66, pulse 66, height 5\' 2"  (1.575 m), weight 122 lb 6.4 oz (55.5 kg), SpO2 98 %.Body mass index is 22.39 kg/m.  General Appearance: Casual  Eye Contact:  Fair  Speech:  Clear and Coherent  Volume:  Normal  Mood:  Depressed  Affect:  Blunt  Thought Process:  Goal Directed  Orientation:  Full (Time,  Place, and Person)  Thought Content:  WDL  Suicidal Thoughts:  No  Homicidal Thoughts:  No  Memory:  NA  Judgement:  Good  Insight:  Good  Psychomotor Activity:  Normal  Concentration:    Recall:  [place] of Knowledge:Good  Language: Poor  Akathisia:  No  Handed:  Right  AIMS (if indicated):    Assets:  Desire for Improvement  ADL's:  Intact  Cognition: WNL  Sleep:      Treatment Plan Summary: At this time the patient continue in therapy but seems to be very helpful for her. The patient  will continue taking Zoloft will change it to taking it in the morning 150 mg. She'll take trazodone when necessary as needed. Overall the patient is functioning fairly well. She certainly is not suicidal. The patient is functioning okay with life. The patient is eating. She has good eye contact. This patient she'll return to see me in 3 months.   Haskel Schroeder, MD 12/21/20172:22 PM

## 2016-12-12 ENCOUNTER — Ambulatory Visit (HOSPITAL_COMMUNITY): Payer: Self-pay | Admitting: Licensed Clinical Social Worker

## 2016-12-26 ENCOUNTER — Ambulatory Visit (INDEPENDENT_AMBULATORY_CARE_PROVIDER_SITE_OTHER): Payer: BLUE CROSS/BLUE SHIELD | Admitting: Licensed Clinical Social Worker

## 2016-12-26 DIAGNOSIS — F339 Major depressive disorder, recurrent, unspecified: Secondary | ICD-10-CM | POA: Diagnosis not present

## 2016-12-26 DIAGNOSIS — F4321 Adjustment disorder with depressed mood: Secondary | ICD-10-CM | POA: Diagnosis not present

## 2016-12-26 DIAGNOSIS — Z634 Disappearance and death of family member: Secondary | ICD-10-CM

## 2016-12-28 ENCOUNTER — Encounter (HOSPITAL_COMMUNITY): Payer: Self-pay | Admitting: Licensed Clinical Social Worker

## 2016-12-28 NOTE — Progress Notes (Signed)
THERAPIST PROGRESS NOTE  Session Time: 3:10-4 pm  Participation Level:Active  Behavioral Response:CasualAlert/Depressed  Type of Therapy: Individual Therapy  Treatment Goals addressed: Coping  Interventions:CBT  Summary: Mia Underwood a 66 y.o.femalewho presents with depression. Pt appeared frustrated today. Pt reports she is frustrated with her 2 granddaughters that she is trying to get temporary custody of.It has been an exasperating process dealing with government agencies. She is upset they way her younger granddaughter is being treated by her foster placement but has no control over the situation. Processed with pt about control and "letting go" of what she doesn't have control over. Pt cried and spoke at length about her daughter and how difficult it is without her. Processed with pt the stages of grief. Pt is still not interested in going to any grief support groups at Hospice.Again processed with pt about closure.  Pt reported she benefited by coming in today for a therapy session and feels like it helps her to move forward in the grief process. She said her depression symptoms are less today.    Suicidal/Homicidal:Nowithout intent/plan  Therapist Response: Assessed pt's current functioning and reviewed progress. Assisted pt processing issues with family, grief, and processing for management of stressors.  Plan: Return again in 4 weeks with CBT  Diagnosis:Axis I: Major Depression, recurent, chronic      Vera Furniss S, LCAS 12/26/16

## 2017-01-23 ENCOUNTER — Ambulatory Visit (HOSPITAL_COMMUNITY): Payer: Self-pay | Admitting: Licensed Clinical Social Worker

## 2017-02-07 ENCOUNTER — Encounter (HOSPITAL_COMMUNITY): Payer: Self-pay | Admitting: Psychiatry

## 2017-02-07 ENCOUNTER — Ambulatory Visit (INDEPENDENT_AMBULATORY_CARE_PROVIDER_SITE_OTHER): Payer: BLUE CROSS/BLUE SHIELD | Admitting: Psychiatry

## 2017-02-07 VITALS — BP 140/80 | HR 69 | Ht 61.0 in | Wt 118.2 lb

## 2017-02-07 DIAGNOSIS — F17210 Nicotine dependence, cigarettes, uncomplicated: Secondary | ICD-10-CM | POA: Diagnosis not present

## 2017-02-07 DIAGNOSIS — F1290 Cannabis use, unspecified, uncomplicated: Secondary | ICD-10-CM

## 2017-02-07 DIAGNOSIS — Z79899 Other long term (current) drug therapy: Secondary | ICD-10-CM

## 2017-02-07 DIAGNOSIS — Z818 Family history of other mental and behavioral disorders: Secondary | ICD-10-CM | POA: Diagnosis not present

## 2017-02-07 DIAGNOSIS — F325 Major depressive disorder, single episode, in full remission: Secondary | ICD-10-CM | POA: Diagnosis not present

## 2017-02-07 DIAGNOSIS — Z811 Family history of alcohol abuse and dependence: Secondary | ICD-10-CM

## 2017-02-07 MED ORDER — SERTRALINE HCL 100 MG PO TABS: ORAL_TABLET | ORAL | 5 refills | Status: DC

## 2017-02-07 NOTE — Progress Notes (Signed)
Psychiatric Initial Adult Assessment   Patient Identification: Mia Underwood MRN:  [id number] Date of Evaluation:  02/07/2017 Referral Source: [facility] Chief Complaint:    Visit Diagnosis: Major depression  History of Present Illness:    Today the patient is doing fairly well. Her mood is better. She has purpose at this time in that she's caring for her 65 year old daughter who is living with her. Her granddaughter is low maintenanc also a good student  at this time the patient is sleeping well. She has good appetite. She takes trazodone only now and then. Her mood is much improved. Her anxiety is poor control. Because she is living with her granddaughter she no longer drinks alcohol very much at all and uses no marijuana. Her mother is an issue as she is a class nursing patient says overall she feels like she is functioning well. She has no psychosis. She is engaged in therapy in the setting and does well.  (Hypo) Manic Symptoms:   Anxiety Symptoms:   Psychotic Symptoms:   PTSD Symptoms:   Past Psychiatric History:   Previous Psychotropic Medications: Yes   Substance Abuse History in the last 12 months:  Yes.    Consequences of Substance Abuse:   Past Medical History:  Past Medical History:  Diagnosis Date  . Allergic rhinitis 05/04/2016  . Bilateral carpal tunnel syndrome 10/18/2010   Left > Right   . Depression with anxiety 09/14/2009  . Diverticulosis of sigmoid colon 06/07/2010  . Essential hypertension 09/14/2009  . Hyperlipidemia 09/14/2009  . 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 Marijuana abuse 04/04/2016  . Tobacco abuse 01/17/2016  . Vitamin D deficiency 04/04/2016    Past Surgical History:  Procedure Laterality Date  . ABDOMINAL HYSTERECTOMY  April 1996   For fibroids    Family Psychiatric History:   Family History:  Family History  Problem Relation Age of Onset  . Breast cancer Mother   . Depression Mother   . Hypertension Mother    . Dementia Mother   . Alcoholism Father     Died in his 41's related to alcohol abuse  . Leukemia Sister 8  . Early death Brother     Died in a motor vehicle accident  . Heart attack Daughter 76    Per patient report, specifics unknown  . Diabetes Mellitus II Maternal Grandmother   . Diabetes Mellitus II Paternal Grandmother   . Healthy Sister   . Healthy Sister   . Arthritis Brother   . Healthy Brother   . Healthy Brother   . Healthy Son     Social History:   Social History   Social History  . Marital status: Single    Spouse name: N/A  . Number of children: 2  . Years of education: 12 grade   Occupational History  . 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 Smoking status: Current Every Day Smoker    Packs/day: 0.20    Types: Cigarettes    Start date: 12/02/1973  . Smokeless tobacco: Never Used     Comment: Stopped in 1980 restarted in 1985. 1pack every 8 days.  Stopped on 01/20/2016. Restarted again.  . Alcohol use 14.4 oz/week    12 Cans of beer, 12 Standard drinks or equivalent per week  . Drug use: Yes    Types: Marijuana     Comment: not using anymore  . Sexual activity: No   Other  Topics Concern  . None   Social History Narrative   Graduated from [employer] in [place].  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    Additional Social History:   Allergies:   Allergies  Allergen Reactions  . Penicillins Hives  . Naproxen Rash    Metabolic Disorder Labs: No results found for: HGBA1C, MPG No results found for: PROLACTIN Lab Results  Component Value Date   CHOL 187 08/03/2016   TRIG 66 08/03/2016   HDL 90 08/03/2016   CHOLHDL 2.1 08/03/2016   VLDL 20 11/01/2010   LDLCALC 84 08/03/2016   LDLCALC 127 (H) 01/03/2016     Current Medications: Current Outpatient Prescriptions  Medication Sig Dispense Refill  . amLODipine (NORVASC) 10 MG tablet Take 1 tablet (10 mg total) by mouth daily. 90 tablet 3  .  aspirin EC 81 MG tablet Take 81 mg by mouth daily.    . calcium carbonate (OS-CAL) 600 MG TABS tablet Take 1 tablet (600 mg total) by mouth 2 (two) times daily with a meal. (Patient not taking: Reported on 08/03/2016) 180 tablet 3  . cetirizine (ZYRTEC) 10 MG tablet Take 1 tablet (10 mg total) by mouth at bedtime. 90 tablet 3  . cholecalciferol (VITAMIN D) 1000 UNITS tablet Take 1,000 Units by mouth daily.    Marland Kitchen gabapentin (NEURONTIN) 600 MG tablet Take 1 tablet (600 mg total) by mouth 2 (two) times daily. 180 tablet 3  . hydrochlorothiazide (HYDRODIURIL) 25 MG tablet Take 1 tablet (25 mg total) by mouth daily. 90 tablet 3  . nicotine polacrilex (NICORETTE) 2 MG gum Take 1 each (2 mg total) by mouth as needed for smoking cessation. (Patient not taking: Reported on 08/03/2016) 100 tablet 1  . sertraline (ZOLOFT) 100 MG tablet One  and  a half 45 tablet 5  . simvastatin (ZOCOR) 20 MG tablet Take 1 tablet (20 mg total) by mouth daily. 30 tablet   . traZODone (DESYREL) 50 MG tablet 2  qhs 60 tablet 5   No current facility-administered medications for this visit.     Neurologic: Headache: No Seizure: No Paresthesias:No  Musculoskeletal: Strength & Muscle Tone: within normal limits Gait & Station: normal Patient leans: N/A  Psychiatric Specialty Exam: ROS  Blood pressure 140/80, pulse 69, height 5\' 1"  (1.549 m), weight 118 lb 3.2 oz (53.6 kg).Body mass index is 22.33 kg/m.  General Appearance: Casual  Eye Contact:  Fair  Speech:  Clear and Coherent  Volume:  Normal  Mood:  Depressed  Affect:  Blunt  Thought Process:  Goal Directed  Orientation:  Full (Time, Place, and Person)  Thought Content:  WDL  Suicidal Thoughts:  No  Homicidal Thoughts:  No  Memory:  NA  Judgement:  Good  Insight:  Good  Psychomotor Activity:  Normal  Concentration:    Recall:  [place] of Knowledge:Good  Language: Poor  Akathisia:  No  Handed:  Right  AIMS (if indicated):    Assets:  Desire for  Improvement  ADL's:  Intact  Cognition: WNL  Sleep:      Treatment Plan Summary: This patient will continue taking Zoloft 150 mg a day. She'll continue taking trazodone on a when necessary basis. She'll continue in therapy. Overall the patient is functioning well. She denies any chest pain or shortness of breath she denies any neurological symptoms at this time. Noted is that she has a son is doing fairly well. Her daughter who died  of MI 2017 is still reminded occasionally. Overall though the patient is functioning quite well. I think she finds purpose of taking care of her granddaughter. Haskel Schroeder, MD 3/14/20182:14 PM

## 2017-02-12 ENCOUNTER — Ambulatory Visit (INDEPENDENT_AMBULATORY_CARE_PROVIDER_SITE_OTHER): Payer: BLUE CROSS/BLUE SHIELD | Admitting: Licensed Clinical Social Worker

## 2017-02-12 DIAGNOSIS — Z634 Disappearance and death of family member: Secondary | ICD-10-CM

## 2017-02-12 DIAGNOSIS — F339 Major depressive disorder, recurrent, unspecified: Secondary | ICD-10-CM | POA: Diagnosis not present

## 2017-02-12 DIAGNOSIS — F4321 Adjustment disorder with depressed mood: Secondary | ICD-10-CM

## 2017-02-14 ENCOUNTER — Encounter (HOSPITAL_COMMUNITY): Payer: Self-pay | Admitting: Licensed Clinical Social Worker

## 2017-02-14 NOTE — Progress Notes (Signed)
THERAPIST PROGRESS NOTE  Session Time: 1:10-2 pm  Participation Level:Active  Behavioral Response:CasualAlert/Depressed  Type of Therapy: Individual Therapy  Treatment Goals addressed: Coping  Interventions:CBT  Summary: Mia Underwood a 66 y.o.femalewho presents with sadness. This week is the anniversary of her daughters' death 1 year ago. Processed with pt her feelings of grief. She was tearful in talking about her stages of grief. Pt also processed her feelings about her mother who is currently in a skilled facility. Pt shares how alone she feels with no family support to help her through her feelings of grief. Pt has physical  Custody of her grandaughter, which has given her a new purpose.in life. Her granddaughter also has a dog, which is a new facet in her household. Pt is learning to "be outside of her comfort zone." Processed with pt how she can celebrate her daughter's life this week. Pt reports she still is not interested in going to any grief support groups [facility] which has been suggested.   Suicidal/Homicidal:Nowithout intent/plan  Therapist Response: Assessed pt's current functioning and reviewed progress. Assisted pt processing issues with family, grief, and processing for management of stressors.  Plan: Return again in 1 week to process her grief.  Diagnosis:Axis I: Major Depression, recurent, chronic      Elmore Hyslop S, LCAS 02/14/17

## 2017-02-19 ENCOUNTER — Ambulatory Visit (INDEPENDENT_AMBULATORY_CARE_PROVIDER_SITE_OTHER): Payer: BLUE CROSS/BLUE SHIELD | Admitting: Licensed Clinical Social Worker

## 2017-02-19 DIAGNOSIS — Z634 Disappearance and death of family member: Secondary | ICD-10-CM | POA: Diagnosis not present

## 2017-02-19 DIAGNOSIS — F4321 Adjustment disorder with depressed mood: Secondary | ICD-10-CM

## 2017-02-19 DIAGNOSIS — F339 Major depressive disorder, recurrent, unspecified: Secondary | ICD-10-CM | POA: Diagnosis not present

## 2017-02-21 ENCOUNTER — Encounter (HOSPITAL_COMMUNITY): Payer: Self-pay | Admitting: Licensed Clinical Social Worker

## 2017-02-21 NOTE — Progress Notes (Signed)
THERAPIST PROGRESS NOTE  Session Time: 11:10 -12:00 pm  Participation Level:Active  Behavioral Response:CasualAlert/Depressed  Type of Therapy: Individual Therapy  Treatment Goals addressed: Coping  Interventions:CBT  Summary: Mia Underwood a 66 y.o.femalewho presents with sadness. Last week was the anniversary of her daughters' death 1 year ago. Processed with pt her feelings of grief and how she and her granddaughter celebrated her life. She was tearful in talking about her daughter. Pt also processed her feelings about her mother who is currently in a skilled facility. Pt shares how alone she feels with no family support to help her through her feelings of grief, and no one in her family called her on the anniversary of her daughter's death.. Pt is adjusting and enjoying having her granddaughter living with her. She has concerns about her emotional well being and has made an appt for her to see a therapist at [facility]. Pt has a shift in her life with a new purpose. She has to change her whole routine which helps with her depression. She gets out of bed at 6am to get her granddaughter ready for school then takes her to school. This is a new phase of pt's life which is bringing joy.  Suicidal/Homicidal:Nowithout intent/plan  Therapist Response: Assessed pt's current functioning and reviewed progress. Assisted pt processing issues with family, grief, and processing for management of stressors.  Plan: Return again in 1 week to process her grief.  Diagnosis:Axis I: Major Depression, recurent, chronic      [name],LISBETH S, LCAS 02/19/17

## 2017-02-27 ENCOUNTER — Ambulatory Visit (INDEPENDENT_AMBULATORY_CARE_PROVIDER_SITE_OTHER): Payer: BLUE CROSS/BLUE SHIELD | Admitting: Licensed Clinical Social Worker

## 2017-02-27 DIAGNOSIS — F339 Major depressive disorder, recurrent, unspecified: Secondary | ICD-10-CM | POA: Diagnosis not present

## 2017-02-27 DIAGNOSIS — F4321 Adjustment disorder with depressed mood: Secondary | ICD-10-CM | POA: Diagnosis not present

## 2017-02-27 DIAGNOSIS — Z634 Disappearance and death of family member: Secondary | ICD-10-CM
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
## 2017-03-01 ENCOUNTER — Encounter (HOSPITAL_COMMUNITY): Payer: Self-pay | Admitting: Licensed Clinical Social Worker

## 2017-03-07 ENCOUNTER — Ambulatory Visit (INDEPENDENT_AMBULATORY_CARE_PROVIDER_SITE_OTHER): Payer: BLUE CROSS/BLUE SHIELD | Admitting: Internal Medicine

## 2017-03-07 ENCOUNTER — Encounter: Payer: Self-pay | Admitting: Internal Medicine

## 2017-03-07 VITALS — BP 181/79 | HR 73 | Temp 99.0°F | Ht 64.0 in | Wt 120.7 lb

## 2017-03-07 DIAGNOSIS — E785 Hyperlipidemia, unspecified: Secondary | ICD-10-CM

## 2017-03-07 DIAGNOSIS — E559 Vitamin D deficiency, unspecified: Secondary | ICD-10-CM

## 2017-03-07 DIAGNOSIS — Z0000 Encounter for general adult medical examination without abnormal findings: Secondary | ICD-10-CM

## 2017-03-07 DIAGNOSIS — Z79899 Other long term (current) drug therapy: Secondary | ICD-10-CM | POA: Diagnosis not present

## 2017-03-07 DIAGNOSIS — F17210 Nicotine dependence, cigarettes, uncomplicated: Secondary | ICD-10-CM

## 2017-03-07 DIAGNOSIS — I10 Essential (primary) hypertension: Secondary | ICD-10-CM

## 2017-03-07 DIAGNOSIS — Z720 Tobacco use: Secondary | ICD-10-CM

## 2017-03-07 DIAGNOSIS — J301 Allergic rhinitis due to pollen: Secondary | ICD-10-CM

## 2017-03-07 DIAGNOSIS — F418 Other specified anxiety disorders: Secondary | ICD-10-CM

## 2017-03-07 DIAGNOSIS — E7849 Other hyperlipidemia: Secondary | ICD-10-CM

## 2017-03-07 MED ORDER — LISINOPRIL-HYDROCHLOROTHIAZIDE 20-25 MG PO TABS: 1.0000 | ORAL_TABLET | Freq: Every day | ORAL | 3 refills | Status: DC

## 2017-03-07 NOTE — Assessment & Plan Note (Signed)
Assessment  She continues to smoke and is still in the pre-contemplative stage. That being said, her housing has now issued and edict that there is to be no more smoking within the facility. This is stimulating her to reassess the need for smoking cessation. We spent at least 3 minutes during this visit talking about the importance of smoking cessation given her underlying health issues, in particular her family history. She's not interested in pharmacologic intervention other than her current as needed nicotine gum.  Plan  She was encouraged to quit smoking and to use the nicotine gum as needed. We will reassess the efficacy of any attempts at smoking cessation at the follow-up visit.

## 2017-03-07 NOTE — Patient Instructions (Addendum)
It was good to see you again.  I am happy that things are working better for you, especially your grand daughter.  1) I am happy to hear you are going to try to quit.  Let me know if you need any other help with this.  2) I stopped your hydrochlorothiazide and started hydrochlorothiazide-lisinopril.  Take 1 tablet once a day in the morning.  3) Keep taking the other medications as you are.  I will see you back in 3 months, sooner if necessary.

## 2017-03-07 NOTE — Assessment & Plan Note (Signed)
She is currently up-to-date on her healthcare maintenance. 

## 2017-03-07 NOTE — Progress Notes (Signed)
   Subjective:    Patient ID: Mia Underwood, female    DOB: 05-03-1951, 66 y.o.   MRN: [id number]  HPI  CHARELLE PETRAKIS is here for follow-up of her essential hypertension, anxiety and depression, allergic rhinitis, and tobacco abuse. Please see the A&P for the status of the pt's chronic medical problems.  Review of Systems  Constitutional: Negative for activity change, appetite change and unexpected weight change.  HENT: Positive for congestion, postnasal drip, rhinorrhea and sneezing.   Respiratory: Negative for apnea, cough, chest tightness, shortness of breath and wheezing.   Cardiovascular: Negative for chest pain, palpitations and leg swelling.  Gastrointestinal: Negative for abdominal pain, constipation, diarrhea, nausea and vomiting.  Musculoskeletal: Negative for arthralgias, back pain, joint swelling and myalgias.  Skin: Negative for rash and wound.  Psychiatric/Behavioral: Positive for dysphoric mood. The patient is nervous/anxious.       Objective:   Physical Exam  Constitutional: She is oriented to person, place, and time. She appears well-developed and well-nourished. No distress.  HENT:  Head: Normocephalic and atraumatic.  Eyes: Conjunctivae are normal. Right eye exhibits no discharge. Left eye exhibits no discharge. No scleral icterus.  Cardiovascular: Normal rate, regular rhythm and normal heart sounds.  Exam reveals no gallop and no friction rub.   No murmur heard. Pulmonary/Chest: Effort normal and breath sounds normal. No respiratory distress. She has no wheezes. She has no rales.  Musculoskeletal: Normal range of motion. She exhibits no edema or deformity.  Neurological: She is alert and oriented to person, place, and time.  Skin: Skin is warm and dry. No rash noted. She is not diaphoretic. No erythema.  Psychiatric: She has a normal mood and affect. Her behavior is normal. Judgment and thought content normal.  Nursing note and vitals reviewed.     Assessment  & Plan:   Please see problem oriented charting.

## 2017-03-07 NOTE — Assessment & Plan Note (Signed)
Assessment  Her allergies have worsened with the increased pollen as of recent time. That being said, she continues to respond well to the cetirizine 10 mg by mouth at bedtime as needed.  Plan  We will continue the cetirizine 10 mg by mouth daily and reassess the efficacy of this therapy in controlling her allergic rhinitis at the follow-up visit.

## 2017-03-07 NOTE — Assessment & Plan Note (Signed)
Assessment  Her depression and anxiety are slowly improving. She has achieved significant benefit from counseling as well as Dr. Karen Chafe pharmacologic intervention. She is currently on sertraline 150 mg by mouth daily as needed as well as as needed trazodone 100 mg at bedtime for sleep. She has set boundaries with her family which has been helpful in controlling the stress related to those issues. In addition, her 65 year old granddaughter is living with her and this has given her a new sense of purpose which has markedly improved her overall sense of well-being and mood.  Plan  We will continue with the counseling as well as the sertraline at 150 mg by mouth daily. We will reassess the efficacy of this intervention at the follow-up visit.

## 2017-03-07 NOTE — Assessment & Plan Note (Signed)
Assessment  She continues to take the vitamin D supplementation without any difficulties.  Plan  We will defer obtaining a vitamin D level until the next visit since there are no other labs that are due today and she will have a BMP drawn at the follow-up visit. Therefore, we will continue the vitamin D supplementation at this time.

## 2017-03-07 NOTE — Assessment & Plan Note (Signed)
Assessment  Her blood pressure remains elevated today 181/79. This is well above target. Her current medications are hydrochlorothiazide 25 mg by mouth daily and amlodipine 10 mg by mouth daily. She would benefit from additional pharmacologic intervention.  Plan  We will continue the amlodipine at 10 mg by mouth daily and discontinue the hydrochlorothiazide and replace it with a combination of lisinopril-hydrochlorothiazide 20-25 mg by mouth daily. We will reassess the blood pressure at the follow-up visit and also obtain a basic metabolic panel given the addition of the ACE inhibitor.

## 2017-03-07 NOTE — Assessment & Plan Note (Signed)
Assessment  She denies any myalgias on the simvastatin 20 mg by mouth daily.  Plan  We will continue with this moderate intensity statin and reassess for intolerances at the follow-up visit.

## 2017-03-14 ENCOUNTER — Encounter (HOSPITAL_COMMUNITY): Payer: Self-pay | Admitting: Licensed Clinical Social Worker

## 2017-03-14 ENCOUNTER — Ambulatory Visit (INDEPENDENT_AMBULATORY_CARE_PROVIDER_SITE_OTHER): Payer: BLUE CROSS/BLUE SHIELD | Admitting: Licensed Clinical Social Worker

## 2017-03-14 DIAGNOSIS — F418 Other specified anxiety disorders: Secondary | ICD-10-CM | POA: Diagnosis not present

## 2017-03-14 DIAGNOSIS — F4321 Adjustment disorder with depressed mood: Secondary | ICD-10-CM

## 2017-03-14 DIAGNOSIS — Z634 Disappearance and death of family member: Secondary | ICD-10-CM
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
## 2017-03-15 ENCOUNTER — Other Ambulatory Visit: Payer: Self-pay | Admitting: Internal Medicine

## 2017-03-15 DIAGNOSIS — Z1231 Encounter for screening mammogram for malignant neoplasm of breast: Secondary | ICD-10-CM

## 2017-04-03 ENCOUNTER — Encounter (HOSPITAL_COMMUNITY): Payer: Self-pay | Admitting: Licensed Clinical Social Worker

## 2017-04-03 ENCOUNTER — Ambulatory Visit (INDEPENDENT_AMBULATORY_CARE_PROVIDER_SITE_OTHER): Payer: BLUE CROSS/BLUE SHIELD | Admitting: Licensed Clinical Social Worker

## 2017-04-03 DIAGNOSIS — Z634 Disappearance and death of family member: Secondary | ICD-10-CM

## 2017-04-03 DIAGNOSIS — F4321 Adjustment disorder with depressed mood: Secondary | ICD-10-CM

## 2017-04-03 DIAGNOSIS — F339 Major depressive disorder, recurrent, unspecified: Secondary | ICD-10-CM

## 2017-04-03 NOTE — Progress Notes (Signed)
THERAPIST PROGRESS NOTE  Session Time: 10:10 -11:00 am  Participation Level:Active  Behavioral Response:CasualAlert/Depressed/Sad  Type of Therapy: Individual Therapy  Treatment Goals addressed: Coping  Interventions:CBT  Summary: Mia Underwood a 66 y.o.femalewho presents with depression. Pt was sad today. She admits she is tired and stressed by family. Discussed boundaries with pt and the relationship circles. Role played boundaries with pt. Pt filled out her relationship circles and chose who she wants in her life. Pt was grieving the loss of her daughter. Gave pt an opportunity to talk about her. Ask pt, "Where is your happiness?" Pt thought about this question and realized she is determined to create her own happiness, have who she wants in her life and use boundaries with those she doesn't. Pt was smiling when she left the session. She said she felt encouraged and hopeful.  Suicidal/Homicidal:Nowithout intent/plan  Therapist Response: Assessed pt's current functioning and reviewed progress. Assisted pt processing issues with family, boundaries, self care, and processing for management of stressors.  Plan: Return again in 2 weeks to process changes in mood and self care.  Diagnosis:Axis I: Major Depression, recurent, chronic      Odette Watanabe S, LCAS 04/03/17

## 2017-04-12 ENCOUNTER — Ambulatory Visit
Admission: RE | Admit: 2017-04-12 | Discharge: 2017-04-12 | Disposition: A | Discharge status: Home / Self Care | Payer: BLUE CROSS/BLUE SHIELD | Source: Ambulatory Visit | Attending: Internal Medicine | Admitting: Internal Medicine

## 2017-04-12 DIAGNOSIS — Z1231 Encounter for screening mammogram for malignant neoplasm of breast: Secondary | ICD-10-CM

## 2017-04-17 ENCOUNTER — Ambulatory Visit (HOSPITAL_COMMUNITY): Payer: Self-pay | Admitting: Licensed Clinical Social Worker

## 2017-04-24 ENCOUNTER — Ambulatory Visit (INDEPENDENT_AMBULATORY_CARE_PROVIDER_SITE_OTHER): Payer: BLUE CROSS/BLUE SHIELD | Admitting: Licensed Clinical Social Worker

## 2017-04-24 ENCOUNTER — Encounter (HOSPITAL_COMMUNITY): Payer: Self-pay | Admitting: Licensed Clinical Social Worker

## 2017-04-24 DIAGNOSIS — Z634 Disappearance and death of family member: Secondary | ICD-10-CM | POA: Diagnosis not present

## 2017-04-24 DIAGNOSIS — F4321 Adjustment disorder with depressed mood: Secondary | ICD-10-CM

## 2017-04-24 DIAGNOSIS — F339 Major depressive disorder, recurrent, unspecified: Secondary | ICD-10-CM | POA: Diagnosis not present
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
## 2017-05-08 ENCOUNTER — Ambulatory Visit (HOSPITAL_COMMUNITY): Payer: Self-pay | Admitting: Licensed Clinical Social Worker

## 2017-05-15 ENCOUNTER — Encounter (HOSPITAL_COMMUNITY): Payer: Self-pay | Admitting: Licensed Clinical Social Worker

## 2017-05-15 ENCOUNTER — Ambulatory Visit (INDEPENDENT_AMBULATORY_CARE_PROVIDER_SITE_OTHER): Payer: BLUE CROSS/BLUE SHIELD | Admitting: Licensed Clinical Social Worker

## 2017-05-15 DIAGNOSIS — F339 Major depressive disorder, recurrent, unspecified: Secondary | ICD-10-CM

## 2017-05-15 NOTE — Progress Notes (Signed)
THERAPIST PROGRESS NOTE  Session Time: 10:10 -11:00 am  Participation Level:Active  Behavioral Response:CasualAlert/Depressed/Sad  Type of Therapy: Individual Therapy  Treatment Goals addressed: Coping  Interventions:CBT  Summary: Mia Underwood a 66 y.o.femalewho presents for her individual counseling session. Pt discussed her psychiatric symptoms and current life events. Pt had a wig on today. Congratulated pt on working on her self care. Pt shared she is just starting. She wants to get mani-pedi next. Pt is struggling still with her granddaughter who lives with her. She doesn't communicate well to pt. Pt does everything she can to make sure she is successful in life. Pt is no longer disrespectful, just not engaging in conversation. Granddaughter has been spending some time with other family her own age, which has been somewhat successful. Pt would like the two of them take a family vacation. Taught pt  A new grounding technique and challenged her to use this tool in assisting with her anxious/frustrating moments. Pt was receptive to the mindfulness practice.           Suicidal/Homicidal:Nowithout intent/plan  Therapist Response: Assessed pt's current functioning and reviewed progress. Assisted pt processing issues with family, boundaries, self care, and processing for management of stressors.  Plan: Return again in 2 weeks to process changes in mood and self care.  Diagnosis:Axis I: Major Depression, recurent, chronic      [name],LISBETH S, LCAS 05/15/17

## 2017-05-29 ENCOUNTER — Ambulatory Visit (HOSPITAL_COMMUNITY): Payer: Self-pay | Admitting: Licensed Clinical Social Worker

## 2017-06-04 ENCOUNTER — Telehealth: Payer: Self-pay | Admitting: Internal Medicine

## 2017-06-04 NOTE — Telephone Encounter (Signed)
Pt reassured and went over her BP meds, she is thankful

## 2017-06-04 NOTE — Telephone Encounter (Signed)
Patient has questions about what medicines she should be taking for her blood pressure. Pt states you may leave a message for her  if necessary.

## 2017-06-10 ENCOUNTER — Other Ambulatory Visit (HOSPITAL_COMMUNITY): Payer: Self-pay | Admitting: Psychiatry

## 2017-06-13 ENCOUNTER — Encounter (HOSPITAL_COMMUNITY): Payer: Self-pay | Admitting: Psychiatry

## 2017-06-13 ENCOUNTER — Ambulatory Visit (INDEPENDENT_AMBULATORY_CARE_PROVIDER_SITE_OTHER): Payer: BLUE CROSS/BLUE SHIELD | Admitting: Psychiatry

## 2017-06-13 VITALS — BP 152/84 | HR 64 | Ht 61.0 in | Wt 121.6 lb

## 2017-06-13 DIAGNOSIS — Z810 Family history of intellectual disabilities: Secondary | ICD-10-CM

## 2017-06-13 DIAGNOSIS — Z818 Family history of other mental and behavioral disorders: Secondary | ICD-10-CM

## 2017-06-13 DIAGNOSIS — F17210 Nicotine dependence, cigarettes, uncomplicated: Secondary | ICD-10-CM | POA: Diagnosis not present

## 2017-06-13 DIAGNOSIS — F3340 Major depressive disorder, recurrent, in remission, unspecified: Secondary | ICD-10-CM

## 2017-06-13 DIAGNOSIS — Z811 Family history of alcohol abuse and dependence: Secondary | ICD-10-CM

## 2017-06-13 MED ORDER — SERTRALINE HCL 100 MG PO TABS: ORAL_TABLET | ORAL | 5 refills | Status: DC

## 2017-06-13 MED ORDER — TRAZODONE HCL 50 MG PO TABS: ORAL_TABLET | ORAL | 5 refills | Status: DC

## 2017-06-13 NOTE — Progress Notes (Signed)
Psychiatric Initial Adult Assessment   Patient Identification: Mia Underwood MRN:  [id number] Date of Evaluation:  06/13/2017 Referral Source: [facility] [place] Chief Complaint:    Visit Diagnosis: Major depression  History of Present Illness:   Today the patient is doing fairly well. To clarify her social circumstances that she lives by herself with her 65 year old granddaughter. Her granddaughter is an Catering manager and will be going to early college soon. The patient denies daily depression. She sleeping and eating well. She comes to her therapy on a regular basis and does very well. She's got good energy. She enjoys reading watching TV and likes music. She is enjoying life. She denies the use of alcohol or drugs. The patient is seen by Dr. Cherene Julian. She is medically stable. She denies significant anxiety. It is noted that her daughter died of an MI in March 23, 2016. The patient actually has 2 grandchildren one lives with her and another 33 year old her who she has little contact with. The granddaughter asked he tries to contact her relatively regularly for money but the patient keeps her distance. The patient has a mother his got moderate dementia and presently in [facility]. Presently the patient is not in any relationship. The patient is happy with herself. She is a good sense of self-esteem. Patient is certainly not suicidal.  PTSD Symptoms:   Past Psychiatric History:   Previous Psychotropic Medications: Yes   Substance Abuse History in the last 12 months:  Yes.    Consequences of Substance Abuse:   Past Medical History:  Past Medical History:  Diagnosis Date  . Allergic rhinitis 05/04/2016  . Allergy   . Anxiety   . Bilateral carpal tunnel syndrome 10/18/2010   Left > Right   . Depression   . Depression with anxiety 09/14/2009  . Diverticulosis of sigmoid colon 06/07/2010  . Essential hypertension 09/14/2009  . Hyperlipidemia 09/14/2009  . Insomnia secondary to  depression with anxiety 05/04/2016  . Marijuana abuse 04/04/2016  . Tobacco abuse 01/17/2016  . Vitamin D deficiency 04/04/2016    Past Surgical History:  Procedure Laterality Date  . ABDOMINAL HYSTERECTOMY  April 1996   For fibroids    Family Psychiatric History:   Family History:  Family History  Problem Relation Age of Onset  . Breast cancer Mother   . Depression Mother   . Hypertension Mother   . Dementia Mother   . Alcoholism Father        Died in his 22's related to alcohol abuse  . Leukemia Sister 8  . Early death Brother        Died in a motor vehicle accident  . Heart attack Daughter 23       Per patient report, specifics unknown  . Diabetes Mellitus II Maternal Grandmother   . Diabetes Mellitus II Paternal Grandmother   . Healthy Sister   . Healthy Sister   . Arthritis Brother   . Healthy Brother   . Healthy Brother   . Healthy Son     Social History:   Social History   Social History  . Marital status: Single    Spouse name: N/A  . Number of children: 2  . Years of education: 12 grade   Occupational History  . 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 Smoking status: Current Every Day Smoker    Packs/day: 0.20    Types: Cigarettes    Start date: 12/02/1973  .  Smokeless tobacco: Never Used     Comment: Stopped in 1980 restarted in 1985. 1pack every 8 days.  Stopped on 01/20/2016. Restarted again.  . Alcohol use 8.4 - 9.0 oz/week    12 Standard drinks or equivalent, 2 - 3 Cans of beer per week     Comment: occasional weekend use  . Drug use: No  . Sexual activity: No   Other Topics Concern  . None   Social History Narrative   Graduated from [employer] in [place].  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    Additional Social History:   Allergies:   Allergies  Allergen Reactions  . Penicillins Hives  . Naproxen Rash    Metabolic Disorder Labs: No results found for: HGBA1C, MPG No results found  for: PROLACTIN Lab Results  Component Value Date   CHOL 187 08/03/2016   TRIG 66 08/03/2016   HDL 90 08/03/2016   CHOLHDL 2.1 08/03/2016   VLDL 20 11/01/2010   LDLCALC 84 08/03/2016   LDLCALC 127 (H) 01/03/2016     Current Medications: Current Outpatient Prescriptions  Medication Sig Dispense Refill  . amLODipine (NORVASC) 10 MG tablet Take 1 tablet (10 mg total) by mouth daily. 90 tablet 3  . aspirin EC 81 MG tablet Take 81 mg by mouth daily.    . calcium carbonate (OS-CAL) 600 MG TABS tablet Take 1 tablet (600 mg total) by mouth 2 (two) times daily with a meal. 180 tablet 3  . cetirizine (ZYRTEC) 10 MG tablet Take 1 tablet (10 mg total) by mouth at bedtime. 90 tablet 3  . cholecalciferol (VITAMIN D) 1000 UNITS tablet Take 1,000 Units by mouth daily.    Marland Kitchen gabapentin (NEURONTIN) 600 MG tablet Take 1 tablet (600 mg total) by mouth 2 (two) times daily. 180 tablet 3  . lisinopril-hydrochlorothiazide (PRINZIDE,ZESTORETIC) 20-25 MG tablet Take 1 tablet by mouth daily. 90 tablet 3  . sertraline (ZOLOFT) 100 MG tablet One  and  a half 45 tablet 5  . simvastatin (ZOCOR) 20 MG tablet Take 1 tablet (20 mg total) by mouth daily. 30 tablet   . traZODone (DESYREL) 50 MG tablet 2  qhs 60 tablet 5   No current facility-administered medications for this visit.     Neurologic: Headache: No Seizure: No Paresthesias:No  Musculoskeletal: Strength & Muscle Tone: within normal limits Gait & Station: normal Patient leans: N/A  Psychiatric Specialty Exam: ROS  Blood pressure (!) 152/84, pulse 64, height 5\' 1"  (1.549 m), weight 121 lb 9.6 oz (55.2 kg).Body mass index is 22.98 kg/m.  General Appearance: Casual  Eye Contact:  Fair  Speech:  Clear and Coherent  Volume:  Normal  Mood:  Depressed  Affect:  Blunt  Thought Process:  Goal Directed  Orientation:  Full (Time, Place, and Person)  Thought Content:  WDL  Suicidal Thoughts:  No  Homicidal Thoughts:  No  Memory:  NA  Judgement:  Good   Insight:  Good  Psychomotor Activity:  Normal  Concentration:    Recall:  [place] of Knowledge:Good  Language: Poor  Akathisia:  No  Handed:  Right  AIMS (if indicated):    Assets:  Desire for Improvement  ADL's:  Intact  Cognition: WNL  Sleep:      Treatment Plan Summary: 7/18/20183:12 PM   The patient is #1 problem is that of clinical major depression which this time is in remission. She continues taking Zoloft 150 mg every day  takes trazodone on a when necessary basis. Also as part of her treatment plan she is in psychotherapy and does very well. Patient at this time is very stable. She is functioning very well. She'll return to see me in 5 months.

## 2017-06-14 ENCOUNTER — Encounter (HOSPITAL_COMMUNITY): Payer: Self-pay | Admitting: Licensed Clinical Social Worker

## 2017-06-14 ENCOUNTER — Ambulatory Visit (INDEPENDENT_AMBULATORY_CARE_PROVIDER_SITE_OTHER): Payer: BLUE CROSS/BLUE SHIELD | Admitting: Licensed Clinical Social Worker

## 2017-06-14 DIAGNOSIS — F339 Major depressive disorder, recurrent, unspecified: Secondary | ICD-10-CM | POA: Diagnosis not present

## 2017-06-14 NOTE — Progress Notes (Signed)
THERAPIST PROGRESS NOTE  Session Time: 10:10 -11:00 am  Participation Level:Active  Behavioral Response:CasualAlert/Agitated  Type of Therapy: Individual Therapy  Treatment Goals addressed: Coping  Interventions:CBT  Summary: Mia Underwood a 66 y.o.femalewho presents for her individual counseling session. Pt discussed her psychiatric symptoms and current life events. Pt was agitated today due to her older grandchild continuing to berate her on consistent She has tried to help this older grandchild but she wants it her way. Pt has custody of the younger grandchild. They are working to develop a relationship. Pt is working hard on buildlng a trusting relationship.  Pt reports her depressive symptoms have increased due to aggravation due to her older granddaughter. Taught pt boundaries to use with her granddaughter. Role played boundaries with pt. Pt was interested in learning more grounding techniques. Taught pt more grounding techniques and practiced in session. Pt was tearful talking about her daughter. Validated pt on her feelings of grief and walked pt through the stages of grief.      Suicidal/Homicidal:Nowithout intent/plan  Therapist Response: Assessed pt's current functioning and reviewed progress. Assisted pt processing issues with family, boundaries, self care, grief, and processing for management of stressors.  Plan: Return again in 2 weeks to process changes in mood and self care.  Diagnosis:Axis I: Major Depression, recurent, chronic      [name],LISBETH S, LCAS 06/14/17

## 2017-06-15 ENCOUNTER — Other Ambulatory Visit: Payer: Self-pay | Admitting: Internal Medicine

## 2017-06-15 DIAGNOSIS — J302 Other seasonal allergic rhinitis: Secondary | ICD-10-CM

## 2017-06-28 ENCOUNTER — Encounter (HOSPITAL_COMMUNITY): Payer: Self-pay | Admitting: Licensed Clinical Social Worker

## 2017-06-28 ENCOUNTER — Ambulatory Visit (INDEPENDENT_AMBULATORY_CARE_PROVIDER_SITE_OTHER): Payer: BLUE CROSS/BLUE SHIELD | Admitting: Licensed Clinical Social Worker

## 2017-06-28 DIAGNOSIS — F339 Major depressive disorder, recurrent, unspecified: Secondary | ICD-10-CM

## 2017-06-28 NOTE — Progress Notes (Signed)
THERAPIST PROGRESS NOTE  Session Time: 11:10 -12pm  Participation Level:Active  Behavioral Response:CasualAlert/Agitated  Type of Therapy: Individual Therapy  Treatment Goals addressed: Coping  Interventions:CBT  Summary: URI COVEY a 66 y.o.femalewho presents for her individual counseling session. Pt discussed her psychiatric symptoms and current life events. Pt was agitated today due to her younger grandchild who lives with her. They have been arguing consistently and pt feels unappreciated. They are currently not talking to each other. Explained to pt about adolescent behavior, grief and adjustment. Pt is frustrated and feels disrespected. Validated pt's feelings but also processed alternative thoughts.with pt. Pt has been experiencing headaches for about a week. Will have nurse to take her blood pressure after session. Discussed vulnerability with pt and how to use this during problem solving with granddaughter. Pt's granddaughter sees another therapist here at [facility].           Suicidal/Homicidal:Nowithout intent/plan  Therapist Response: Assessed pt's current functioning and reviewed progress. Assisted pt processing issues with family,,self care, grief, and processing for management of stressors.  Plan: Return again in 2 weeks to process changes in mood and relationships.  Diagnosis:Axis I: Major Depression, recurent, chronic      [name],LISBETH S, LCAS 06/28/17

## 2017-07-12 ENCOUNTER — Ambulatory Visit (HOSPITAL_COMMUNITY): Payer: Self-pay | Admitting: Licensed Clinical Social Worker

## 2017-07-24 ENCOUNTER — Ambulatory Visit (INDEPENDENT_AMBULATORY_CARE_PROVIDER_SITE_OTHER): Payer: BLUE CROSS/BLUE SHIELD | Admitting: Licensed Clinical Social Worker

## 2017-07-24 ENCOUNTER — Encounter (HOSPITAL_COMMUNITY): Payer: Self-pay | Admitting: Licensed Clinical Social Worker

## 2017-07-24 DIAGNOSIS — F339 Major depressive disorder, recurrent, unspecified: Secondary | ICD-10-CM

## 2017-07-24 NOTE — Progress Notes (Signed)
THERAPIST PROGRESS NOTE  Session Time: 9:10 -10am  Participation Level:Active  Behavioral Response:CasualAlert/Upbeat  Type of Therapy: Individual Therapy  Treatment Goals addressed: Coping  Interventions:CBT  Summary: Mia Underwood a 66 y.o.femalewho presents for her individual counseling session. Pt discussed her psychiatric symptoms and current life events. Pt reports she is tired of taking her meds and wants to come off of them. Disucssed the purpose of her meds and suggested she speak with her PCP and psychiatrist before stopping them. Pt has been working on her boundaries with her family. She has felt frustrated and disrespected previously. Pt has also been working on her self care. Today she was dressed up, had a wig on and was wearing earrings. Validated pt on her self care.          Suicidal/Homicidal:Nowithout intent/plan  Therapist Response: Assessed pt's current functioning and reviewed progress. Assisted pt processing issues with family,,self care, and processing for management of stressors.  Plan: Return again in 2 weeks to process changes in mood and relationships.  Diagnosis:Axis I: Major Depression, recurent, chronic      Bonnie Roig S, LCAS 07/24/17

## 2017-08-02 ENCOUNTER — Ambulatory Visit (INDEPENDENT_AMBULATORY_CARE_PROVIDER_SITE_OTHER): Payer: BLUE CROSS/BLUE SHIELD | Admitting: Internal Medicine

## 2017-08-02 ENCOUNTER — Encounter: Payer: Self-pay | Admitting: Internal Medicine

## 2017-08-02 VITALS — BP 191/84 | HR 78 | Temp 98.4°F | Wt 126.6 lb

## 2017-08-02 DIAGNOSIS — F339 Major depressive disorder, recurrent, unspecified: Secondary | ICD-10-CM | POA: Diagnosis not present

## 2017-08-02 DIAGNOSIS — J3089 Other allergic rhinitis: Secondary | ICD-10-CM

## 2017-08-02 DIAGNOSIS — I10 Essential (primary) hypertension: Secondary | ICD-10-CM

## 2017-08-02 DIAGNOSIS — Z720 Tobacco use: Secondary | ICD-10-CM

## 2017-08-02 DIAGNOSIS — Z23 Encounter for immunization: Secondary | ICD-10-CM | POA: Diagnosis not present

## 2017-08-02 DIAGNOSIS — F418 Other specified anxiety disorders: Secondary | ICD-10-CM

## 2017-08-02 DIAGNOSIS — F5105 Insomnia due to other mental disorder: Secondary | ICD-10-CM | POA: Diagnosis not present

## 2017-08-02 DIAGNOSIS — E559 Vitamin D deficiency, unspecified: Secondary | ICD-10-CM | POA: Diagnosis not present

## 2017-08-02 DIAGNOSIS — F17210 Nicotine dependence, cigarettes, uncomplicated: Secondary | ICD-10-CM

## 2017-08-02 DIAGNOSIS — E7800 Pure hypercholesterolemia, unspecified: Secondary | ICD-10-CM | POA: Diagnosis not present

## 2017-08-02 MED ORDER — GABAPENTIN 600 MG PO TABS: 600.0000 mg | ORAL_TABLET | Freq: Every day | ORAL | 3 refills | Status: DC

## 2017-08-02 MED ORDER — SIMVASTATIN 20 MG PO TABS: 20.0000 mg | ORAL_TABLET | Freq: Every day | ORAL | 3 refills | Status: DC

## 2017-08-02 MED ORDER — LISINOPRIL-HYDROCHLOROTHIAZIDE 20-25 MG PO TABS: 2.0000 | ORAL_TABLET | Freq: Every day | ORAL | 3 refills | Status: DC

## 2017-08-02 NOTE — Assessment & Plan Note (Signed)
Assessment  Her allergic rhinitis is well controlled on as needed Zyrtec 10 mg by mouth daily when necessary.  Plan  We will continue the Zyrtec 10 mg by mouth daily as needed and reassess the efficacy of this therapy at the follow-up visit.

## 2017-08-02 NOTE — Assessment & Plan Note (Signed)
Assessment  She continues to smoke approximately one cigarette daily. She states that a pack of cigarettes will last her for 2 weeks. She was concerned about using the Nicorette gum and smoking. I assured her that the Nicorette gum was not a high enough dose to interact with an occasional cigarette. I encouraged her to use the Nicorette gum more often in hopes of completely stopping smoking altogether.  Plan  We will continue the as needed Nicorette gum. We reviewed the appropriate way in which to use this medication. We will reassess if she was able to completely abstain from tobacco use at the follow-up visit.

## 2017-08-02 NOTE — Assessment & Plan Note (Signed)
Assessment  She is tolerating the simvastatin 20 mg by mouth nightly without myalgias.  Plan  We will continue with this moderate intensity statin and reassess for intolerances at the follow-up visit.

## 2017-08-02 NOTE — Progress Notes (Signed)
   Subjective:    Patient ID: Mia Underwood, female    DOB: 1950/12/08, 66 y.o.   MRN: [id number]  HPI  Mia Underwood is here for follow-up of her major depression that is chronic and recurrent, essential hypertension, vitamin D deficiency, tobacco abuse, hyperlipidemia, and allergic rhintis. Please see the A&P for the status of the pt's chronic medical problems.  Her only new complaint is a recent history of a fall 4-6 weeks ago. She states she cannot remember the event. All she remembers is waking up the following morning with a knot on the left side of her head. She does not remember how she got to bed. This has never happened before, nor has it happened since. Since that episode she notes occasional unsteady gait that occurs for periods of about 5 minutes before resolving spontaneously. This unsteady gait is not associated with changes in position and will come on spontaneously. She specifically denies any vertigo. She has also had headaches after this fall, although they are on the opposite side of the head than where she felt the knot. She has right-sided headaches that begin above the right eye and radiate down the face to the back of the head and down to the trapezius muscle on the right. This pain is intermittent and is characterized as a tightness. She admits she has been under a lot of stress recently related to caring for her youngest granddaughter in the setting of a father who apparently has not taken an interest in his daughter, but who also refuses to relinquish custody making it difficult for Mia Underwood to assist her youngest granddaughter with healthcare issues.The reason for the gabapentin 600 mg by mouth twice daily is unclear. As this could contribute to some of her dizziness and/or the fall we have decreased this to 600 mg by mouth at night. If she remains asymptomatic we will further weaning this off at follow-up.  Review of Systems  Constitutional: Negative for activity change,  appetite change and unexpected weight change.  HENT: Positive for rhinorrhea. Negative for congestion, postnasal drip, sinus pain and sneezing.   Respiratory: Negative for cough, chest tightness, shortness of breath and wheezing.   Cardiovascular: Negative for chest pain, palpitations and leg swelling.  Musculoskeletal: Positive for myalgias and neck pain.  Skin: Negative for rash and wound.  Allergic/Immunologic: Positive for environmental allergies.  Neurological: Positive for light-headedness and headaches. Negative for weakness and numbness.  Psychiatric/Behavioral: Positive for dysphoric mood and sleep disturbance. The patient is nervous/anxious.       Objective:   Physical Exam  Constitutional: She is oriented to person, place, and time. She appears well-developed and well-nourished. No distress.  HENT:  Head: Normocephalic and atraumatic.  Eyes: Conjunctivae are normal. Right eye exhibits no discharge. Left eye exhibits no discharge. No scleral icterus.  Neck: Normal range of motion. Neck supple.  Pulmonary/Chest: No stridor.  Musculoskeletal: Normal range of motion. She exhibits no edema, tenderness or deformity.  Neurological: She is alert and oriented to person, place, and time. She exhibits normal muscle tone. Coordination normal.  Skin: Skin is warm and dry. No rash noted. She is not diaphoretic. No erythema.  Psychiatric: She has a normal mood and affect. Her behavior is normal. Judgment and thought content normal.  Nursing note and vitals reviewed.     Assessment & Plan:   Please see problem based charting.

## 2017-08-02 NOTE — Assessment & Plan Note (Signed)
Assessment  Her blood pressure remains elevated today despite the amlodipine 10 mg by mouth daily and lisinopril-hydrochlorothiazide 20 mg-25 mg by mouth daily. She states she is tolerating these medications well and has been compliant with them.  Plan  We will continue the amlodipine at 10 mg by mouth daily and increase the lisinopril-hydrochlorothiazide 20-25 mg to 2 tablets by mouth daily. We have checked a basic metabolic panel given hydrochlorothiazide and lisinopril and it is pending at the time of this dictation. We will reassess the efficacy of this regimen at the follow-up visit. Although the next step would be initiation of a beta blocker, depending on her potassium, I may also consider initiation of spironolactone instead.

## 2017-08-02 NOTE — Patient Instructions (Signed)
It was great to see you again!  Keep working on taking care of yourself.  1) Increase the lisinopril-HCTZ to 2 tablets daily for your blood pressure.  2) Decrease the gabapentin 600 mg to once a night.  3) Keep taking the other medications as you are.  4) We gave you the flu shot today.  I will see you in 3 months, sooner if necessary.

## 2017-08-02 NOTE — Assessment & Plan Note (Signed)
She received a flu vaccination today. She is otherwise up-to-date on her health maintenance.

## 2017-08-02 NOTE — Assessment & Plan Note (Signed)
Assessment  She states she has been compliant with the vitamin D supplementation. A vitamin D level was obtained at this visit and is pending at the time of this dictation.  Plan  She will continue the vitamin D supplementation until we have the results of the vitamin D level back. She stated I could leave the results on her answering machine. If her vitamin D level is above 30 we will discontinue the vitamin D supplementation. Otherwise, we will make adjustments as required.

## 2017-08-02 NOTE — Assessment & Plan Note (Signed)
Assessment  She continues to struggle with major depression which is chronic and recurrent but her PHQ-2 screen today was 0. She is followed by Dr. Casimiro Needle as well as Ms. Noah Delaine and feels the counseling as well as the sertraline 150 mg by mouth daily is effective. She continues to work on caring for herself and has made nice progress in setting boundaries with her family members. She cares deeply about her youngest granddaughter and remains frustrated with the child's relationship with the father. She is concerned that prior to graduating from high school in turning 19 she will continue to struggle with making sure the granddaughter is in a position for future success. She also states that her insomnia related to her depression and anxiety does respond nicely to as needed trazodone.  Plan  She will continue following closely with her behavioral health specialists as well as continue with the sertraline 150 mg by mouth daily and trazodone 100 mg by mouth as needed for insomnia. We will continue to reassess her progress in managing her depression using the PHQ-2 and PHQ-9 scales while on this therapy and receiving this counseling.

## 2017-08-02 NOTE — Assessment & Plan Note (Signed)
Assessment  Her insomnia is well managed with as needed trazodone 100 mg by mouth when necessary.  Plan  We will continue the trazodone 100 mg by mouth at night as needed and reassess the efficacy of this therapy at the follow-up visit.

## 2017-08-03 ENCOUNTER — Encounter: Payer: Self-pay | Admitting: Internal Medicine

## 2017-08-03 LAB — BMP8+ANION GAP
Anion Gap: 13 mmol/L (ref 10.0–18.0)
BUN / CREAT RATIO: 26 (ref 12–28)
BUN: 15 mg/dL (ref 8–27)
CHLORIDE: 105 mmol/L (ref 96–106)
CO2: 22 mmol/L (ref 20–29)
Calcium: 9.7 mg/dL (ref 8.7–10.3)
Creatinine, Ser: 0.58 mg/dL (ref 0.57–1.00)
GFR calc non Af Amer: 98 mL/min/{1.73_m2} (ref >59)
GFR, EST AFRICAN AMERICAN: 113 mL/min/{1.73_m2} (ref >59)
GLUCOSE: 89 mg/dL (ref 65–99)
Potassium: 4 mmol/L (ref 3.5–5.2)
Sodium: 140 mmol/L (ref 134–144)

## 2017-08-03 LAB — VITAMIN D 25 HYDROXY (VIT D DEFICIENCY, FRACTURES): Vit D, 25-Hydroxy: 29.1 ng/mL — ABNORMAL LOW (ref 30.0–100.0)

## 2017-08-07 NOTE — Progress Notes (Signed)
Patient ID: Mia Underwood, female   DOB: 01/02/1951, 66 y.o.   MRN: [id number]  Vitamin D level remains slightly low at 29.1  Will continue the vitamin D supplementation at the current dose and reassess at follow-up in 6 months.  BMP is unremarkable, thus we will continue the lisinopril-HCTZ at the currently prescribed dose.  Mia Underwood was called with this information.

## 2017-08-08 ENCOUNTER — Encounter (HOSPITAL_COMMUNITY): Payer: Self-pay | Admitting: Licensed Clinical Social Worker

## 2017-08-08 ENCOUNTER — Ambulatory Visit (INDEPENDENT_AMBULATORY_CARE_PROVIDER_SITE_OTHER): Payer: BLUE CROSS/BLUE SHIELD | Admitting: Licensed Clinical Social Worker

## 2017-08-08 DIAGNOSIS — F339 Major depressive disorder, recurrent, unspecified: Secondary | ICD-10-CM

## 2017-08-08 NOTE — Progress Notes (Signed)
THERAPIST PROGRESS NOTE  Session Time: 9:10 -10am  Participation Level:Active  Behavioral Response:CasualAlert/sad  Type of Therapy: Individual Therapy  Treatment Goals addressed: Coping  Interventions:CBT  Summary: Mia Underwood a 66 y.o.femalewho presents for her individual counseling session. Pt discussed her psychiatric symptoms and current life events. Pt reports her depressive symptoms have stabilized. She is sad today though, her daughter's birthday is 9/23. Suggested a book on grief for pt.  Processed grief with pt. She talked about memories of her daughter. She is trying to do right by her daughter by helping her grandchildren. Her youngest grandaughter who lives with her seems to be doing well at [employer]. Pt has worked hard to assist her with making goals for her future. Discussed relationships and boundaries with pt. She is using boundaries with all members of her family. She reports it has been difficult but she has practiced. Pt apparently had a fall and doesn't remember it. She spoke with her PCP about the incident. Pt came in with her toenails painted and her wig on. Validated pt on continuing her own self care.           Suicidal/Homicidal:Nowithout intent/plan  Therapist Response: Assessed pt's current functioning and reviewed progress. Assisted pt processing issues with family,grief,self care, and processing for management of stressors.  Plan: Return again in 2 weeks to process changes in mood and relationships.  Diagnosis:Axis I: Major Depression, recurent, chronic      Roddie Riegler S, LCAS 08/08/17

## 2017-08-22 ENCOUNTER — Telehealth (HOSPITAL_COMMUNITY): Payer: Self-pay

## 2017-08-22 ENCOUNTER — Ambulatory Visit (INDEPENDENT_AMBULATORY_CARE_PROVIDER_SITE_OTHER): Payer: BLUE CROSS/BLUE SHIELD | Admitting: Licensed Clinical Social Worker

## 2017-08-22 DIAGNOSIS — Z634 Disappearance and death of family member: Secondary | ICD-10-CM | POA: Diagnosis not present

## 2017-08-22 DIAGNOSIS — F339 Major depressive disorder, recurrent, unspecified: Secondary | ICD-10-CM

## 2017-08-22 DIAGNOSIS — F4321 Adjustment disorder with depressed mood: Secondary | ICD-10-CM

## 2017-08-22 MED ORDER — SERTRALINE HCL 100 MG PO TABS: ORAL_TABLET | ORAL | 5 refills | Status: DC

## 2017-08-22 NOTE — Telephone Encounter (Signed)
Medication management - Telephone call with patient to inform Dr. Casimiro Needle reviewed her medication and current increased symptoms of depression and wanted pt. to increase Zoloft to 100 mg, 2 a day and that he already sent this to her [employer].  Patient agreed with increase and will call back if any problems.  Informed he would assess the effectiveness of the increase at evaluation on 09/05/17 and patient agreed with plan.

## 2017-08-22 NOTE — Telephone Encounter (Signed)
Medication problems - Met with patient after her individual therapy appt. today as she presented with flat affect, depressed mood, tearful and stated she was more depressed and did not feel Zoloft was helping enough.   Patient was tearful, admitted she was more irritable but denied any thoughts of wanting to harm self or others.  Patient denied suicidal ideations and agreed to speak with Dr. Casimiro Needle who will be in later this date about her increased depression as she stated "I just have a lot on me right now" but again denied and thoughts, plans or intent to want to harm self or others.  Agreed to call patient back once status discussed with Dr. Casimiro Needle and patient is scheduled for a follow up appointment on 09/05/17.

## 2017-08-23 ENCOUNTER — Encounter (HOSPITAL_COMMUNITY): Payer: Self-pay | Admitting: Licensed Clinical Social Worker

## 2017-08-23 NOTE — Progress Notes (Signed)
THERAPIST PROGRESS NOTE  Session Time: 9:10 -10am  Participation Level:Active  Behavioral Response:CasualAlert/sad  Type of Therapy: Individual Therapy  Treatment Goals addressed: Coping  Interventions:CBT  Summary: Mia Underwood a 66 y.o.femalewho presents for her individual counseling session. Pt discussed her psychiatric symptoms and current life events. Pt reports she is irritable today. Asked open ended questions about her depressive symptoms, which she is exhibiting. Asked pt if she spoke with her psychiatrist about these symptoms and she replied no. Will se iff there is an earlier appt for her psychiatrist. Pt reports she does not think her Zoloft is working anymore. Pt discussed her daughters birthday which would have been Sunday. Asked open ended questions about the day and her memories. Pt was tearful in describing her memories. Gave pt journal and suggested she start writing her thoughts and feelings between sessions. Pt was in agreement with suggestions.    Suicidal/Homicidal:Nowithout intent/plan  Therapist Response: Assessed pt'Underwood current functioning and reviewed progress. Assisted pt processing issues with family,grief,depressive symptoms, and processing for management of stressors.  Plan: Return again in 2 weeks to process changes in mood and relationships.  Diagnosis:Axis I: Major Depression, recurent, chronic      Mia Underwood,Mia Underwood, LCAS 08/22/17.

## 2017-09-05 ENCOUNTER — Ambulatory Visit (HOSPITAL_COMMUNITY): Payer: Self-pay | Admitting: Licensed Clinical Social Worker

## 2017-09-05 ENCOUNTER — Ambulatory Visit (INDEPENDENT_AMBULATORY_CARE_PROVIDER_SITE_OTHER): Payer: BLUE CROSS/BLUE SHIELD | Admitting: Psychiatry

## 2017-09-05 DIAGNOSIS — Z818 Family history of other mental and behavioral disorders: Secondary | ICD-10-CM

## 2017-09-05 DIAGNOSIS — F329 Major depressive disorder, single episode, unspecified: Secondary | ICD-10-CM | POA: Diagnosis not present

## 2017-09-05 DIAGNOSIS — F320 Major depressive disorder, single episode, mild: Secondary | ICD-10-CM

## 2017-09-05 DIAGNOSIS — Z79899 Other long term (current) drug therapy: Secondary | ICD-10-CM

## 2017-09-05 DIAGNOSIS — F17210 Nicotine dependence, cigarettes, uncomplicated: Secondary | ICD-10-CM

## 2017-09-05 MED ORDER — TRAZODONE HCL 50 MG PO TABS: ORAL_TABLET | ORAL | 5 refills | Status: DC

## 2017-09-05 MED ORDER — SERTRALINE HCL 100 MG PO TABS: ORAL_TABLET | ORAL | 5 refills | Status: DC

## 2017-09-05 NOTE — Progress Notes (Signed)
Psychiatric Initial Adult Assessment   Patient Identification: Mia Underwood MRN:  [id number] Date of Evaluation:  09/05/2017 Referral Source: [facility] Chief Complaint:    Visit Diagnosis: Major depression  Patient is easily overwhelmed by her granddaughters. She has an 65 year old daughter doesn't live with her. She regularly as for money. Her 74 year old granddaughter is pregnant. He clearly patient lives with her 73 year old granddaughter. The granddaughters come from her daughter who died years ago. Patient is left taking care of his granddaughters is having a hard time. Is a difficult time healing she the patient doesn't feel depressed but she is distressed and anxious. She sleeping and eating fairly well. Fortunately she is in therapy here which seems to be very helpful for her. The patient is not suicidal. She is not psychotic. She drinks no alcohol or drugs. Patient takes her medicines as prescribed.   Past Psychiatric History:   Previous Psychotropic Medications: Yes   Substance Abuse History in the last 12 months:  Yes.    Consequences of Substance Abuse:   Past Medical History:  Past Medical History:  Diagnosis Date  . Allergic rhinitis 05/04/2016  . Bilateral carpal tunnel syndrome 10/18/2010   Left > Right   . Diverticulosis of sigmoid colon 06/07/2010  . Essential hypertension 09/14/2009  . Hyperlipidemia 09/14/2009  . 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 Major depression, recurrent, chronic ([place]) 09/14/2009  . Marijuana abuse 04/04/2016  . Tobacco abuse 01/17/2016  . Vitamin D deficiency 04/04/2016    Past Surgical History:  Procedure Laterality Date  . ABDOMINAL HYSTERECTOMY  April 1996   For fibroids    Family Psychiatric History:   Family History:  Family History  Problem Relation Age of Onset  . Breast cancer Mother   . Depression Mother   . Hypertension Mother   . Dementia Mother   . Alcoholism Father        Died in his 62's  related to alcohol abuse  . Leukemia Sister 8  . Early death Brother        Died in a motor vehicle accident  . Heart attack Daughter 70       Per patient report, specifics unknown  . Diabetes Mellitus II Maternal Grandmother   . Diabetes Mellitus II Paternal Grandmother   . Healthy Sister   . Healthy Sister   . Arthritis Brother   . Healthy Brother   . Healthy Brother   . Healthy Son     Social History:   Social History   Social History  . Marital status: Single    Spouse name: N/A  . Number of children: 2  . Years of education: 12 grade   Occupational History  . 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 Smoking status: Current Every Day Smoker    Packs/day: 0.20    Types: Cigarettes    Start date: 12/02/1973  . Smokeless tobacco: Never Used     Comment: Stopped in 1980 restarted in 1985. 1pack every 8 days.  Stopped on 01/20/2016. Restarted again.  . Alcohol use 8.4 - 9.0 oz/week    2 - 3 Cans of beer, 12 Standard drinks or equivalent per week     Comment: occasional weekend use  . Drug use: No  . Sexual activity: No   Other Topics Concern  . Not on file   Social History Narrative   Graduated from [employer] in [place].  Retired  food Music therapist.  Lives alone.  Divorced X 2.    Additional Social History:   Allergies:   Allergies  Allergen Reactions  . Penicillins Hives  . Naproxen Rash    Metabolic Disorder Labs: No results found for: HGBA1C, MPG No results found for: PROLACTIN Lab Results  Component Value Date   CHOL 187 08/03/2016   TRIG 66 08/03/2016   HDL 90 08/03/2016   CHOLHDL 2.1 08/03/2016   VLDL 20 11/01/2010   LDLCALC 84 08/03/2016   LDLCALC 127 (H) 01/03/2016     Current Medications: Current Outpatient Prescriptions  Medication Sig Dispense Refill  . amLODipine (NORVASC) 10 MG tablet Take 1 tablet (10 mg total) by mouth daily. 90 tablet 3  . aspirin EC 81 MG tablet Take 81 mg by mouth daily.    .  calcium carbonate (OS-CAL) 600 MG TABS tablet Take 1 tablet (600 mg total) by mouth 2 (two) times daily with a meal. 180 tablet 3  . cetirizine (ZYRTEC) 10 MG tablet TAKE 1 TABLET(10 MG) BY MOUTH AT BEDTIME 90 tablet 3  . cholecalciferol (VITAMIN D) 1000 UNITS tablet Take 1,000 Units by mouth daily.    Marland Kitchen gabapentin (NEURONTIN) 600 MG tablet Take 1 tablet (600 mg total) by mouth at bedtime. 90 tablet 3  . lisinopril-hydrochlorothiazide (PRINZIDE,ZESTORETIC) 20-25 MG tablet Take 2 tablets by mouth daily. 180 tablet 3  . sertraline (ZOLOFT) 100 MG tablet 2  qam 60 tablet 5  . simvastatin (ZOCOR) 20 MG tablet Take 1 tablet (20 mg total) by mouth daily. 90 tablet 3  . traZODone (DESYREL) 50 MG tablet 2  qhs 60 tablet 5   No current facility-administered medications for this visit.     Neurologic: Headache: No Seizure: No Paresthesias:No  Musculoskeletal: Strength & Muscle Tone: within normal limits Gait & Station: normal Patient leans: N/A  Psychiatric Specialty Exam: ROS  There were no vitals taken for this visit.There is no height or weight on file to calculate BMI.  General Appearance: Casual  Eye Contact:  Fair  Speech:  Clear and Coherent  Volume:  Normal  Mood:  Depressed  Affect:  Blunt  Thought Process:  Goal Directed  Orientation:  Full (Time, Place, and Person)  Thought Content:  WDL  Suicidal Thoughts:  No  Homicidal Thoughts:  No  Memory:  NA  Judgement:  Good  Insight:  Good  Psychomotor Activity:  Normal  Concentration:    Recall:  [place] of Knowledge:Good  Language: Poor  Akathisia:  No  Handed:  Right  AIMS (if indicated):    Assets:  Desire for Improvement  ADL's:  Intact  Cognition: WNL  Sleep:      Treatment Plan Summary: 10/10/20185:10 PM   The patient increase her Zoloft on her last visit 200 mg and she says she does feel better. She also takes trazodone. The most important intervention however is her psychotherapy here. The patient is  struggling with family issues. I think the patient feels lonely in some ways would be open to a relationship. Patient medically is very stable. She takes her medicines just as prescribed. We spent 50% of her time talking about her medications are return to see me in about 3 months.

## 2017-09-19 ENCOUNTER — Ambulatory Visit (HOSPITAL_COMMUNITY): Payer: Self-pay | Admitting: Licensed Clinical Social Worker

## 2017-10-03 ENCOUNTER — Encounter (HOSPITAL_COMMUNITY): Payer: Self-pay | Admitting: Licensed Clinical Social Worker

## 2017-10-03 ENCOUNTER — Ambulatory Visit (INDEPENDENT_AMBULATORY_CARE_PROVIDER_SITE_OTHER): Payer: Medicare HMO | Admitting: Licensed Clinical Social Worker

## 2017-10-03 DIAGNOSIS — F339 Major depressive disorder, recurrent, unspecified: Secondary | ICD-10-CM

## 2017-10-03 NOTE — Progress Notes (Signed)
THERAPIST PROGRESS NOTE  Session Time: 9:10 -10am  Participation Level:Active  Behavioral Response:CasualAlert/sad  Type of Therapy: Individual Therapy  Treatment Goals addressed: Coping  Interventions:CBT  Summary: Mia Underwood a 66 y.o.femalewho presents for her individual counseling session. Pt discussed her psychiatric symptoms and current life events. Pt reports she is irritable today. Asked open ended questions about her depressive symptoms, which she is exhibiting. Pt has been helping older granddaughter with assistance for housing, food stamps, pre natal support. This has been extrrememly stressful for pt as she is caring for her other granddaughter who lives with her. Used radical honesty with pt and emotional reflection. Assisted in building hope and strengthening resiliency in pt. Continue to work with pt on her own self care. She is using adult coloring books, puzzles, reading, watching old movies. She will join the gym silver sneakers with her new insurance co.      Suicidal/Homicidal:Nowithout intent/plan  Therapist Response: Assessed pt's current functioning and reviewed progress. Assisted pt processing issues with family,grief,depressive symptoms, and processing for management of stressors.  Plan: Return again in 2 weeks to process changes in mood and relationships.  Diagnosis:Axis I: Major Depression, recurent, chronic      [name],LISBETH S, LCAS 08/22/17.

## 2017-10-22 ENCOUNTER — Encounter (HOSPITAL_COMMUNITY): Payer: Self-pay | Admitting: Licensed Clinical Social Worker

## 2017-10-22 ENCOUNTER — Ambulatory Visit (INDEPENDENT_AMBULATORY_CARE_PROVIDER_SITE_OTHER): Payer: Medicare HMO | Admitting: Licensed Clinical Social Worker

## 2017-10-22 DIAGNOSIS — F418 Other specified anxiety disorders: Secondary | ICD-10-CM | POA: Diagnosis not present

## 2017-10-22 DIAGNOSIS — F4321 Adjustment disorder with depressed mood: Secondary | ICD-10-CM

## 2017-10-22 DIAGNOSIS — Z634 Disappearance and death of family member: Secondary | ICD-10-CM | POA: Diagnosis not present

## 2017-10-22 NOTE — Progress Notes (Signed)
THERAPIST PROGRESS NOTE  Session Time: 9:10 -10am  Participation Level:Active  Behavioral Response:CasualAlert/sad  Type of Therapy: Individual Therapy  Treatment Goals addressed: Coping  Interventions:CBT  Summary: Mia Underwood a 66 y.o.femalewho presents for her individual counseling session. Pt discussed her psychiatric symptoms and current life events. Pt talked about her Thanksgiving holiday tearfully. She was really missing her daughter at holiday time. Her oldest granddaughter is causing her grief. Taught pt relationship circles. Showed pt how she does not have to let her granddaughter into her inner circle. How the stress her granddaughter causes will eventually cause her chronic problems and cause her life to become unmanageable. Discussed basic CBT concepts and the thought emotion connection and alternative perspectives. Worked with pt on a mindfulness technique to assist pt in being present. Pt was receptive to the mindfulness practice.  Suicidal/Homicidal:Nowithout intent/plan  Therapist Response: Assessed pt's current functioning and reviewed progress. Assisted pt processing issues with family,grief,depressive symptoms, and processing for management of stressors.  Plan: Return again in 2 weeks to process changes in mood and relationships.  Diagnosis:Axis I: Major Depression, recurent, chronic      [name],LISBETH S, LCAS 08/22/17.

## 2017-10-29 ENCOUNTER — Ambulatory Visit: Payer: Self-pay | Admitting: Internal Medicine

## 2017-11-02 ENCOUNTER — Ambulatory Visit: Payer: Self-pay | Admitting: Internal Medicine

## 2017-11-06 ENCOUNTER — Ambulatory Visit (HOSPITAL_COMMUNITY): Payer: Self-pay | Admitting: Licensed Clinical Social Worker

## 2017-11-13 ENCOUNTER — Encounter (HOSPITAL_COMMUNITY): Payer: Self-pay | Admitting: Licensed Clinical Social Worker

## 2017-11-13 ENCOUNTER — Ambulatory Visit (INDEPENDENT_AMBULATORY_CARE_PROVIDER_SITE_OTHER): Payer: Medicare HMO | Admitting: Licensed Clinical Social Worker

## 2017-11-13 DIAGNOSIS — F339 Major depressive disorder, recurrent, unspecified: Secondary | ICD-10-CM | POA: Diagnosis not present

## 2017-11-13 NOTE — Progress Notes (Signed)
THERAPIST PROGRESS NOTE  Session Time: 9:10 -10am  Participation Level:Active  Behavioral Response:CasualAlert/sad  Type of Therapy: Individual Therapy  Treatment Goals addressed: Coping  Interventions:CBT  Summary: Mia Underwood a 66 y.o.femalewho presents for her individual counseling session. Pt discussed her psychiatric symptoms and current life events. Pt was tearful in her discussion of her family relationships. Taught pt relationship circle and boundaries again. Pt is interested in smoking cessation. Taught pt the first steps of smoking cessation. Pt was receptive and encouraged.     Suicidal/Homicidal:Nowithout intent/plan  Therapist Response: Assessed pt's current functioning and reviewed progress. Assisted pt processing issues with family,boundaries, smoking cessation and processing for management of stressors.  Plan: Return again in 2 weeks to process changes in mood and relationships.  Diagnosis:Axis I: Major Depression, recurent, chronic      Livia Tarr S, LCAS 11/13/17

## 2017-11-14 ENCOUNTER — Ambulatory Visit (HOSPITAL_COMMUNITY): Payer: Self-pay | Admitting: Psychiatry

## 2017-11-16 ENCOUNTER — Ambulatory Visit: Payer: Self-pay | Admitting: Internal Medicine

## 2017-11-22 ENCOUNTER — Ambulatory Visit (INDEPENDENT_AMBULATORY_CARE_PROVIDER_SITE_OTHER): Payer: Medicare HMO | Admitting: Licensed Clinical Social Worker

## 2017-11-22 DIAGNOSIS — F339 Major depressive disorder, recurrent, unspecified: Secondary | ICD-10-CM | POA: Diagnosis not present

## 2017-11-23 ENCOUNTER — Encounter (HOSPITAL_COMMUNITY): Payer: Self-pay | Admitting: Licensed Clinical Social Worker

## 2017-11-23 NOTE — Progress Notes (Signed)
THERAPIST PROGRESS NOTE  Session Time: 9:10 -10am  Participation Level:Active  Behavioral Response:CasualAlert/sad  Type of Therapy: Individual Therapy  Treatment Goals addressed: Coping  Interventions:CBT  Summary: Mia Underwood a 66 y.o.femalewho presents for her individual counseling session. Pt discussed her psychiatric symptoms and current life events. Pt reports her medications seem to be working because her depressive symptoms have lessened. Pt discussed her christmas holiday where she spent a quiet time with her granddaughter remembering her daughter. Pt did not see her siblings or extended family. She does not think they treat her well. Pt talked about her family relationships tearfully. Pt desires to work more on her self care. Gave pt information on [employer] scholarship and programming. Also gave pt information on the [employer]. Pt is still thinking about smoking cessation for the new year.      Suicidal/Homicidal:Nowithout intent/plan  Therapist Response: Assessed pt's current functioning and reviewed progress. Assisted pt processing issues with family,boundaries, smoking cessation and processing for management of stressors.  Plan: Return again in 2 weeks to process changes in mood and relationships.  Diagnosis:Axis I: Major Depression, recurent, chronic      Alanah Sakuma S, LCAS 11/23/17

## 2017-11-30 ENCOUNTER — Encounter: Payer: Self-pay | Admitting: Internal Medicine

## 2017-11-30 ENCOUNTER — Ambulatory Visit: Payer: Medicare HMO | Admitting: Internal Medicine

## 2017-11-30 VITALS — BP 118/59 | HR 90 | Temp 98.6°F | Wt 129.2 lb

## 2017-11-30 DIAGNOSIS — E7800 Pure hypercholesterolemia, unspecified: Secondary | ICD-10-CM

## 2017-11-30 DIAGNOSIS — Z720 Tobacco use: Secondary | ICD-10-CM

## 2017-11-30 DIAGNOSIS — E785 Hyperlipidemia, unspecified: Secondary | ICD-10-CM | POA: Diagnosis not present

## 2017-11-30 DIAGNOSIS — F17210 Nicotine dependence, cigarettes, uncomplicated: Secondary | ICD-10-CM | POA: Diagnosis not present

## 2017-11-30 DIAGNOSIS — J301 Allergic rhinitis due to pollen: Secondary | ICD-10-CM

## 2017-11-30 DIAGNOSIS — I10 Essential (primary) hypertension: Secondary | ICD-10-CM | POA: Diagnosis not present

## 2017-11-30 DIAGNOSIS — F419 Anxiety disorder, unspecified: Secondary | ICD-10-CM | POA: Diagnosis not present

## 2017-11-30 DIAGNOSIS — J309 Allergic rhinitis, unspecified: Secondary | ICD-10-CM | POA: Diagnosis not present

## 2017-11-30 DIAGNOSIS — F339 Major depressive disorder, recurrent, unspecified: Secondary | ICD-10-CM | POA: Diagnosis not present

## 2017-11-30 DIAGNOSIS — Z23 Encounter for immunization: Secondary | ICD-10-CM | POA: Diagnosis not present

## 2017-11-30 DIAGNOSIS — F418 Other specified anxiety disorders: Secondary | ICD-10-CM

## 2017-11-30 DIAGNOSIS — Z79899 Other long term (current) drug therapy: Secondary | ICD-10-CM | POA: Diagnosis not present

## 2017-11-30 DIAGNOSIS — F5105 Insomnia due to other mental disorder: Secondary | ICD-10-CM

## 2017-11-30 DIAGNOSIS — Z0000 Encounter for general adult medical examination without abnormal findings: Secondary | ICD-10-CM

## 2017-11-30 NOTE — Progress Notes (Signed)
   Subjective:    Patient ID: Mia Underwood, female    DOB: August 07, 1951, 67 y.o.   MRN: [id number]  HPI  STEPHANIEMARIE Underwood is here for follow-up of her chronic recurrent major depression, essential hypertension, hyperlipidemia, tobacco abuse, and allergic rhinitis. Please see the A&P for the status of the pt's chronic medical problems.  Review of Systems  Constitutional: Negative for activity change, appetite change and unexpected weight change.  HENT: Negative for congestion, postnasal drip, rhinorrhea, sinus pressure and sinus pain.   Respiratory: Negative for cough, chest tightness, shortness of breath and wheezing.   Cardiovascular: Negative for chest pain, palpitations and leg swelling.  Gastrointestinal: Negative for abdominal pain, constipation, diarrhea, nausea and vomiting.  Genitourinary: Negative for difficulty urinating.  Musculoskeletal: Negative for arthralgias, back pain, gait problem, joint swelling and myalgias.  Skin: Negative for rash and wound.  Psychiatric/Behavioral: Negative for agitation, dysphoric mood and sleep disturbance. The patient is not nervous/anxious.       Objective:   Physical Exam  Constitutional: She is oriented to person, place, and time. She appears well-developed and well-nourished. No distress.  HENT:  Head: Normocephalic and atraumatic.  Eyes: Conjunctivae are normal. Right eye exhibits no discharge. Left eye exhibits no discharge. No scleral icterus.  Musculoskeletal: Normal range of motion. She exhibits no edema, tenderness or deformity.  Neurological: She is alert and oriented to person, place, and time. She exhibits normal muscle tone.  Skin: Skin is warm and dry. No rash noted. She is not diaphoretic. No erythema.  Psychiatric: She has a normal mood and affect. Her behavior is normal. Judgment and thought content normal.  Nursing note and vitals reviewed.     Assessment & Plan:   Please see problem oriented charting.

## 2017-11-30 NOTE — Patient Instructions (Addendum)
It was good to see you again.  You are doing a great job with your health.  1) Keep taking the medications as you are.  2) Don't forget the quit line!  I am very happy you are wanting to quit.  3) We gave you a pneumonia shot.  I will see you back in 3 months.

## 2017-11-30 NOTE — Assessment & Plan Note (Signed)
Assessment  Her blood pressure today is 118/59 which is exactly where we want it. This is on lisinopril-hydrochlorothiazide 40-50 mg by mouth daily and amlodipine 10 mg by mouth daily. She states she is tolerating this regimen well.  Plan  We will continue the lisinopril-hydrochlorothiazide 40-50 mg by mouth daily and amlodipine 10 mg by mouth daily. We will reassess her blood pressure control at on this regimen at the follow-up visit.

## 2017-11-30 NOTE — Assessment & Plan Note (Signed)
Assessment  She is tolerating the simvastatin 20 mg by mouth daily without myalgias.  Plan  We will continue with this moderate intensity statin and reassess for evidence of intolerances at the follow-up visit.

## 2017-11-30 NOTE — Assessment & Plan Note (Signed)
Assessment  She denies any allergic rhinitis symptoms. She states she is using the Zyrtec in a prophylactic manner on a daily basis rather than as needed. She feels this is very effective.  Plan  She will continue with the Zyrtec 10 mg by mouth daily. We will reassess for evidence of allergic rhinitis symptoms at the follow-up visit.

## 2017-11-30 NOTE — Assessment & Plan Note (Addendum)
She received the Pneumovax 13 vaccination today. She will be due for the Pneumovax 23 vaccination in one year. She is otherwise up-to-date on her health care maintenance.

## 2017-11-30 NOTE — Assessment & Plan Note (Signed)
Assessment  She uses the trazodone 100 mg by mouth at night as needed. She finds this therapy to be effective when she takes it.  Plan  We will continue the trazodone at 100 mg by mouth at night as needed for insomnia. We will reassess the efficacy of this therapy at the follow-up visit.

## 2017-11-30 NOTE — Assessment & Plan Note (Signed)
Assessment  This is the best I've ever seen Ms. Sessa with regards to her mood. She feels things are going well and is quite happy today. She remains very satisfied with her therapist and sertraline 200 mg by mouth daily.  Plan  She will continue to meet periodically with her therapist as well as the sertraline 200 mg by mouth daily. She seems to be taking to heart some of the advice that she has received regarding placing limits on those around her and this seems to be paying off with her ability to control these outside stressors. We will reassess her depression at the follow-up visit.

## 2017-11-30 NOTE — Assessment & Plan Note (Signed)
Assessment  We again discussed the importance of smoking cessation in her overall health. She states she is ready now to quit and has set today as the quit date. She received quit line materials at the last visit but has not contacted them. She states she will call them today with the hopes of getting assistance and nicotine patch therapy.  Plan  We will follow-up on whether or not she was able to get the nicotine patches from the quit line. She was praised for her desire to quit and setting a date.

## 2017-12-03 NOTE — Addendum Note (Signed)
Addended by: Marcelino Duster on: 12/03/2017 04:58 PM   Modules accepted: Orders

## 2017-12-05 ENCOUNTER — Encounter (HOSPITAL_COMMUNITY): Payer: Self-pay | Admitting: Licensed Clinical Social Worker

## 2017-12-05 ENCOUNTER — Ambulatory Visit (HOSPITAL_COMMUNITY): Payer: Medicare HMO | Admitting: Licensed Clinical Social Worker

## 2017-12-05 DIAGNOSIS — F339 Major depressive disorder, recurrent, unspecified: Secondary | ICD-10-CM

## 2017-12-05 NOTE — Progress Notes (Signed)
THERAPIST PROGRESS NOTE  Session Time: 9:10 -10am  Participation Level:Active  Behavioral Response:CasualAlert/sad  Type of Therapy: Individual Therapy  Treatment Goals addressed: Coping  Interventions:CBT  Summary: Mia Underwood a 67 y.o.femalewho presents for her individual counseling session. Pt discussed her psychiatric symptoms and current life events. Pt reports her medications seem to be working because her depressive symptoms have lessened. She has appt with Dr. Casimiro Needle psychiatrist in February.Pt reports her stress has increased due to her oldest granddaughter. Spent a considerable amount of time teaching pt self soothing skills and practiced them in session. Pt is working hard on her self care and using boundaries with family. Pt went to her PCP and her health is good. Pt followed through with a gym membership by going to [employer].          Suicidal/Homicidal:Nowithout intent/plan  Therapist Response: Assessed pt's current functioning and reviewed progress. Assisted pt processing issues with family,boundaries, self soothing skills, self care and processing for management of stressors.  Plan: Return again in 2 weeks to process changes in mood and relationships.  Diagnosis:Axis I: Major Depression, recurent, chronic      Shizuko Wojdyla S, LCAS 12/05/17

## 2017-12-19 ENCOUNTER — Ambulatory Visit (HOSPITAL_COMMUNITY): Payer: Medicare HMO | Admitting: Licensed Clinical Social Worker

## 2017-12-19 ENCOUNTER — Encounter (HOSPITAL_COMMUNITY): Payer: Self-pay | Admitting: Licensed Clinical Social Worker

## 2017-12-19 DIAGNOSIS — F339 Major depressive disorder, recurrent, unspecified: Secondary | ICD-10-CM

## 2017-12-19 NOTE — Progress Notes (Signed)
THERAPIST PROGRESS NOTE  Session Time: 9:10 -10am  Participation Level:Active  Behavioral Response:CasualAlert/sad  Type of Therapy: Individual Therapy  Treatment Goals addressed: Coping  Interventions:CBT  Summary: Mia Underwood a 67 y.o.femalewho presents for her individual counseling session. Pt discussed her psychiatric symptoms and current life events. Pt was very upset. She brought in her [place] which is currently over $3,000. Pt cancelled them in December after she got another [employer]. Pt reported how she had tried to resolve the issue. She was in tears. I called both [employer] and [employer]. I also called her new insurance co [employer] and talked with her agent. Several suggestions were made by each and I will assist pt in fixing the problem. Pt was grateful of the assistance received.           Suicidal/Homicidal:Nowithout intent/plan  Therapist Response: Assessed pt'Underwood current functioning and reviewed progress. Assisted pt processing issues with insurance companies. Assisted pt processing for management of stressors.  Plan: Return again in 2 weeks    Diagnosis:Axis I: Major Depression, recurent, chronic      Mia Underwood, LCAS 12/19/17

## 2018-01-02 ENCOUNTER — Ambulatory Visit (HOSPITAL_COMMUNITY): Payer: Medicare HMO | Admitting: Licensed Clinical Social Worker

## 2018-01-02 ENCOUNTER — Encounter (HOSPITAL_COMMUNITY): Payer: Self-pay | Admitting: Licensed Clinical Social Worker

## 2018-01-02 DIAGNOSIS — F339 Major depressive disorder, recurrent, unspecified: Secondary | ICD-10-CM | POA: Diagnosis not present

## 2018-01-02 NOTE — Progress Notes (Signed)
THERAPIST PROGRESS NOTE  Session Time: 11:10 -12:00 pm  Participation Level:Active  Behavioral Response:CasualAlert/sad  Type of Therapy: Individual Therapy  Treatment Goals addressed: Coping  Interventions:CBT/DBT  Summary: Mia Underwood a 67 y.o.femalewho presents for her individual counseling session. Pt discussed her psychiatric symptoms and current life events. Pt was frustrated, tired and angry. She is in the midst of packing up her apt because the housing authority is repairing all the apts in her complex. Pt talked about her extended family and how they treat her. She spoke of the hurt and pain of their remarks. Asked pt open ended questions and used emotional reflection. Discussed distress tolerance skills with pt.to help facilitate appropriate responses to external stimuli.      Suicidal/Homicidal:Nowithout intent/plan  Therapist Response: Assessed pt's current functioning and reviewed progress. Assisted pt processing current housing issues, family relationships, distress tolerance skills. Assisted pt processing for management of stressors.  Plan: Return again in 2 weeks    Diagnosis:Axis I: Major Depression, recurent, chronic      [place], LCAS 01/02/18

## 2018-01-09 ENCOUNTER — Ambulatory Visit (HOSPITAL_COMMUNITY): Payer: Self-pay | Admitting: Psychiatry

## 2018-01-09 ENCOUNTER — Encounter (HOSPITAL_COMMUNITY): Payer: Self-pay | Admitting: Psychiatry

## 2018-01-09 ENCOUNTER — Ambulatory Visit (INDEPENDENT_AMBULATORY_CARE_PROVIDER_SITE_OTHER): Payer: Medicare HMO | Admitting: Psychiatry

## 2018-01-09 VITALS — BP 140/80 | HR 79 | Ht 61.0 in | Wt 123.0 lb

## 2018-01-09 DIAGNOSIS — Z818 Family history of other mental and behavioral disorders: Secondary | ICD-10-CM

## 2018-01-09 DIAGNOSIS — Z810 Family history of intellectual disabilities: Secondary | ICD-10-CM | POA: Diagnosis not present

## 2018-01-09 DIAGNOSIS — Z6379 Other stressful life events affecting family and household: Secondary | ICD-10-CM | POA: Diagnosis not present

## 2018-01-09 DIAGNOSIS — G4700 Insomnia, unspecified: Secondary | ICD-10-CM

## 2018-01-09 DIAGNOSIS — Z811 Family history of alcohol abuse and dependence: Secondary | ICD-10-CM

## 2018-01-09 DIAGNOSIS — F17210 Nicotine dependence, cigarettes, uncomplicated: Secondary | ICD-10-CM | POA: Diagnosis not present

## 2018-01-09 DIAGNOSIS — F3342 Major depressive disorder, recurrent, in full remission: Secondary | ICD-10-CM | POA: Diagnosis not present

## 2018-01-09 DIAGNOSIS — F1099 Alcohol use, unspecified with unspecified alcohol-induced disorder: Secondary | ICD-10-CM | POA: Diagnosis not present

## 2018-01-09 MED ORDER — TRAZODONE HCL 50 MG PO TABS: ORAL_TABLET | ORAL | 5 refills | Status: DC

## 2018-01-09 MED ORDER — SERTRALINE HCL 100 MG PO TABS: ORAL_TABLET | ORAL | 5 refills | Status: DC

## 2018-01-09 NOTE — Progress Notes (Signed)
Psychiatric Initial Adult Assessment   Patient Identification: Mia Underwood MRN:  [id number] Date of Evaluation:  01/09/2018 Referral Source: [facility] Chief Complaint:    Visit Diagnosis: Major depression Today the patient has a lot going on.Routine-year-old granddaughter who is pregnant just recently had her water break and presently is in the hospital ready to deliver. Her water broke this morning. Her granddaughter lives in a trailer with probably her boyfriend. The patient's grandson is in prison. Her 10 year old granddaughter lives with her and generally is a good Ship broker but is having some problems with school. This patient's life is gone something like this.In 03-08-15 her grandmother died. 03-07-2016 her daughter died of an MI. In Mar 07, 2017 her mother was so sick she had a go to a nursing home and presently is in [facility]. She apparently is stable. Overall the patient's mood is relatively stable. She is sleeping well and her daughter are bothering her. The problem is the pregnant granddaughter was: All the time and his 5 year old granddaughter who lives with her has had a lot of problems with her homework. Overall the patient is eating well has good energy. She has no problems with thinking concentrating. The patient denies being worthless. She denies being suicidal. She denies the use of any alcohol or drugs. Her energy-year-old daughter who is pregnant and distended deliver the next 24 hours is not very respectful for her. The patient is a conflictual relationship with her. The patient discusses all these issues with her therapist here. Overall the patient's health is good. She started to exercise by goingto a year. The patient clearly got a response and Zoloft will continue. She also continue on her trazodone.   Past Psychiatric History:   Previous Psychotropic Medications: Yes   Substance Abuse History in the last 12 months:  Yes.    Consequences of Substance Abuse:   Past Medical  History:  Past Medical History:  Diagnosis Date  . Allergic rhinitis 05/04/2016  . Bilateral carpal tunnel syndrome 10/18/2010   Left > Right   . Diverticulosis of sigmoid colon 06/07/2010  . Essential hypertension 09/14/2009  . Hyperlipidemia 09/14/2009  . 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 Major depression, recurrent, chronic ([place]) 09/14/2009  . Marijuana abuse 04/04/2016  . Tobacco abuse 01/17/2016  . Vitamin D deficiency 04/04/2016    Past Surgical History:  Procedure Laterality Date  . ABDOMINAL HYSTERECTOMY  April 1996   For fibroids    Family Psychiatric History:   Family History:  Family History  Problem Relation Age of Onset  . Breast cancer Mother   . Depression Mother   . Hypertension Mother   . Dementia Mother   . Alcoholism Father        Died in his 47's related to alcohol abuse  . Leukemia Sister 8  . Early death Brother        Died in a motor vehicle accident  . Heart attack Daughter 85       Per patient report, specifics unknown  . Diabetes Mellitus II Maternal Grandmother   . Diabetes Mellitus II Paternal Grandmother   . Healthy Sister   . Healthy Sister   . Arthritis Brother   . Healthy Brother   . Healthy Brother   . Healthy Son     Social History:   Social History   Socioeconomic History  . Marital status: Single    Spouse name: None  . Number of children: 2  . Years of education:  12 grade  . Highest education level: None  Social Needs  . Financial resource strain: None  . Food insecurity - worry: None  . Food insecurity - inability: None  . Transportation needs - medical: None  . Transportation needs - non-medical: None  Occupational History  . Occupation: Leisure centre manager: [employer]    Comment: Retired  Tobacco Use  . Smoking status: Current Some Day Smoker    Packs/day: 0.20    Types: Cigarettes    Start date: 12/02/1973  . Smokeless tobacco: Never Used  . Tobacco comment: Stopped in 1980 restarted in  1985. 1pack every 8 days.  Stopped on 01/20/2016. Restarted again.  Substance and Sexual Activity  . Alcohol use: Yes    Alcohol/week: 8.4 - 9.0 oz    Types: 2 - 3 Cans of beer, 12 Standard drinks or equivalent per week    Comment: occasional weekend use  . Drug use: No  . Sexual activity: No  Other Topics Concern  . None  Social History Narrative   Graduated from [employer] in [place].  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    Additional Social History:   Allergies:   Allergies  Allergen Reactions  . Penicillins Hives  . Naproxen Rash    Metabolic Disorder Labs: No results found for: HGBA1C, MPG No results found for: PROLACTIN Lab Results  Component Value Date   CHOL 187 08/03/2016   TRIG 66 08/03/2016   HDL 90 08/03/2016   CHOLHDL 2.1 08/03/2016   VLDL 20 11/01/2010   LDLCALC 84 08/03/2016   LDLCALC 127 (H) 01/03/2016     Current Medications: Current Outpatient Medications  Medication Sig Dispense Refill  . amLODipine (NORVASC) 10 MG tablet Take 1 tablet (10 mg total) by mouth daily. 90 tablet 3  . aspirin EC 81 MG tablet Take 81 mg by mouth daily.    . calcium carbonate (OS-CAL) 600 MG TABS tablet Take 1 tablet (600 mg total) by mouth 2 (two) times daily with a meal. 180 tablet 3  . cetirizine (ZYRTEC) 10 MG tablet TAKE 1 TABLET(10 MG) BY MOUTH AT BEDTIME 90 tablet 3  . cholecalciferol (VITAMIN D) 1000 UNITS tablet Take 1,000 Units by mouth daily.    Marland Kitchen gabapentin (NEURONTIN) 600 MG tablet Take 1 tablet (600 mg total) by mouth at bedtime. 90 tablet 3  . lisinopril-hydrochlorothiazide (PRINZIDE,ZESTORETIC) 20-25 MG tablet Take 2 tablets by mouth daily. 180 tablet 3  . sertraline (ZOLOFT) 100 MG tablet 2  qam 60 tablet 5  . simvastatin (ZOCOR) 20 MG tablet Take 1 tablet (20 mg total) by mouth daily. 90 tablet 3  . traZODone (DESYREL) 50 MG tablet 2  qhs 60 tablet 5   No current facility-administered medications for this visit.      Neurologic: Headache: No Seizure: No Paresthesias:No  Musculoskeletal: Strength & Muscle Tone: within normal limits Gait & Station: normal Patient leans: N/A  Psychiatric Specialty Exam: ROS  Blood pressure 140/80, pulse 79, height 5\' 1"  (1.549 m), weight 123 lb (55.8 kg), SpO2 96 %.Body mass index is 23.24 kg/m.  General Appearance: Casual  Eye Contact:  Fair  Speech:  Clear and Coherent  Volume:  Normal  Mood:  Depressed  Affect:  Blunt  Thought Process:  Goal Directed  Orientation:  Full (Time, Place, and Person)  Thought Content:  WDL  Suicidal Thoughts:  No  Homicidal Thoughts:  No  Memory:  NA  Judgement:  Good  Insight:  Good  Psychomotor Activity:  Normal  Concentration:    Recall:  Cerulean of Knowledge:Good  Language: Poor  Akathisia:  No  Handed:  Right  AIMS (if indicated):    Assets:  Desire for Improvement  ADL's:  Intact  Cognition: WNL  Sleep:      Treatment Plan Summary: 2/13/20192:11 PM   At this time the patient is #1 problem is family issues. Mainly as related to a daughter who is disrespectful is about to give birth to a child. The patient obviously is very concerned about the well-being of what is her great-grandchild. The patient's mood is relatively stable. She is quite resilient. She'll continue taking 200 mg of Zoloft continue taking trazodone as prescribed. The patient continue in therapy and she'll return to see me in approximately 3 months. The patient is functioning fairly well. She denies any chest pain or shortness of breath or any neurological problems. So her second specific problems that of clinical major depression seems to be fairly well controlled despite significant psychosocial stressors.Marland Kitchen

## 2018-01-15 ENCOUNTER — Ambulatory Visit (HOSPITAL_COMMUNITY): Payer: Self-pay | Admitting: Licensed Clinical Social Worker

## 2018-01-30 ENCOUNTER — Ambulatory Visit (HOSPITAL_COMMUNITY): Payer: Self-pay | Admitting: Licensed Clinical Social Worker

## 2018-02-06 ENCOUNTER — Ambulatory Visit (HOSPITAL_COMMUNITY): Payer: Medicare HMO | Admitting: Licensed Clinical Social Worker

## 2018-02-06 ENCOUNTER — Encounter (HOSPITAL_COMMUNITY): Payer: Self-pay | Admitting: Licensed Clinical Social Worker

## 2018-02-06 DIAGNOSIS — F339 Major depressive disorder, recurrent, unspecified: Secondary | ICD-10-CM

## 2018-02-06 NOTE — Progress Notes (Signed)
THERAPIST PROGRESS NOTE  Session Time: 9:10 -10am  Participation Level:Active  Behavioral Response:CasualAlert/sad  Type of Therapy: Individual Therapy  Treatment Goals addressed: Coping  Interventions:CBT/DBT  Summary: Mia Underwood a 67 y.o.femalewho presents for her individual counseling session. Pt discussed her psychiatric symptoms and current life events. Pt reports she is experiencing a lot of frustration in her life currently. Her granddaughter had the baby but she has not seen him. Her granddaughter is too chaotic and this causes frustration for the pt. The 90yo granddaughter that lives with the pt has been difficult as well. Pt reports "Im tired" [name] been helping people in my family all my life." Asked pt open ended questions using empathic reflection. Discussed with pt distress tolerance. She is exhibiting low frustration tolerance. Reminded pt to continue using her mindfulness techniques to help with self soothing.       Suicidal/Homicidal:Nowithout intent/plan  Therapist Response: Assessed pt's current functioning and reviewed progress. Assisted pt processing self soothing, mindfulness techniques, family relationships, distress tolerance skills. Assisted pt processing for management of stressors.  Plan: Return again in 2 weeks    Diagnosis:Axis I: Major Depression, recurent, chronic      Onika Gudiel S, LCAS 02/06/18

## 2018-02-13 ENCOUNTER — Encounter (HOSPITAL_COMMUNITY): Payer: Self-pay | Admitting: Licensed Clinical Social Worker

## 2018-02-13 ENCOUNTER — Ambulatory Visit (INDEPENDENT_AMBULATORY_CARE_PROVIDER_SITE_OTHER): Payer: Medicare HMO | Admitting: Licensed Clinical Social Worker

## 2018-02-13 DIAGNOSIS — F339 Major depressive disorder, recurrent, unspecified: Secondary | ICD-10-CM

## 2018-02-13 NOTE — Progress Notes (Signed)
THERAPIST PROGRESS NOTE  Session Time: 9:10 -10am  Participation Level:Active  Behavioral Response:CasualAlert/sad  Type of Therapy: Individual Therapy  Treatment Goals addressed: Coping  Interventions:CBT/DBT  Summary: Mia Underwood a 68 y.o.femalewho presents for her individual counseling session. Pt discussed her psychiatric symptoms and current life events. Pt presented tearful and distraught today. She reports she feels like she has a depression relapse. She continues to grieve the loss of her daughter while trying to help with her 3 grandchildren, who are ungrateful, rude. She reports she is agitated and frustrated. Pt wanted to vent today about how much she is doing for her grandchildren and they don't appreciated it. She is all these children has so she tries very hard to be supportive of them.  She says, "im tired and I miss my daughter so much." Asked open ended questions and used empathic reflection.  Suicidal/Homicidal:Nowithout intent/plan  Therapist Response: Assessed pt's current functioning and reviewed progress. Assisted pt processing grief, sadness, family relationships,  Assisted pt processing for management of stressors.  Plan: Return again in 2 weeks    Diagnosis:Axis I: Major Depression, recurent, chronic      [name],LISBETH S, LCAS 02/13/18

## 2018-02-18 ENCOUNTER — Ambulatory Visit (HOSPITAL_COMMUNITY): Payer: Self-pay | Admitting: Licensed Clinical Social Worker

## 2018-03-04 ENCOUNTER — Other Ambulatory Visit: Payer: Self-pay | Admitting: Internal Medicine

## 2018-03-04 DIAGNOSIS — Z1231 Encounter for screening mammogram for malignant neoplasm of breast: Secondary | ICD-10-CM

## 2018-03-08 ENCOUNTER — Ambulatory Visit (INDEPENDENT_AMBULATORY_CARE_PROVIDER_SITE_OTHER): Payer: Medicare HMO | Admitting: Internal Medicine

## 2018-03-08 ENCOUNTER — Encounter: Payer: Self-pay | Admitting: Internal Medicine

## 2018-03-08 ENCOUNTER — Encounter (INDEPENDENT_AMBULATORY_CARE_PROVIDER_SITE_OTHER): Payer: Self-pay

## 2018-03-08 VITALS — BP 196/92 | HR 62 | Wt 126.4 lb

## 2018-03-08 DIAGNOSIS — J309 Allergic rhinitis, unspecified: Secondary | ICD-10-CM | POA: Diagnosis not present

## 2018-03-08 DIAGNOSIS — I10 Essential (primary) hypertension: Secondary | ICD-10-CM | POA: Diagnosis not present

## 2018-03-08 DIAGNOSIS — F339 Major depressive disorder, recurrent, unspecified: Secondary | ICD-10-CM | POA: Diagnosis not present

## 2018-03-08 DIAGNOSIS — F17200 Nicotine dependence, unspecified, uncomplicated: Secondary | ICD-10-CM | POA: Diagnosis not present

## 2018-03-08 DIAGNOSIS — E559 Vitamin D deficiency, unspecified: Secondary | ICD-10-CM | POA: Diagnosis not present

## 2018-03-08 DIAGNOSIS — Z9114 Patient's other noncompliance with medication regimen: Secondary | ICD-10-CM

## 2018-03-08 DIAGNOSIS — Z79899 Other long term (current) drug therapy: Secondary | ICD-10-CM | POA: Diagnosis not present

## 2018-03-08 DIAGNOSIS — E785 Hyperlipidemia, unspecified: Secondary | ICD-10-CM | POA: Diagnosis not present

## 2018-03-08 DIAGNOSIS — E7800 Pure hypercholesterolemia, unspecified: Secondary | ICD-10-CM

## 2018-03-08 DIAGNOSIS — Z720 Tobacco use: Secondary | ICD-10-CM

## 2018-03-08 DIAGNOSIS — J301 Allergic rhinitis due to pollen: Secondary | ICD-10-CM

## 2018-03-08 DIAGNOSIS — Z0000 Encounter for general adult medical examination without abnormal findings: Secondary | ICD-10-CM

## 2018-03-08 MED ORDER — CALCIUM CARBONATE 600 MG PO TABS: 600.0000 mg | ORAL_TABLET | Freq: Two times a day (BID) | ORAL | 3 refills | Status: DC

## 2018-03-08 MED ORDER — VITAMIN D 1000 UNITS PO TABS: 1000.0000 [IU] | ORAL_TABLET | Freq: Every day | ORAL | 3 refills | Status: DC

## 2018-03-08 MED ORDER — AMLODIPINE BESYLATE 10 MG PO TABS
10.0000 mg | ORAL_TABLET | Freq: Every day | ORAL | 3 refills | Status: DC
Start: 2018-03-08 — End: 2018-06-27

## 2018-03-08 NOTE — Assessment & Plan Note (Signed)
Assessment  She is taking the vitamin D and asked for a refill.  Plan  We will continue with the vitamin D and a refill was provided.

## 2018-03-08 NOTE — Progress Notes (Signed)
   Subjective:    Patient ID: Mia Underwood, female    DOB: Jan 23, 1951, 67 y.o.   MRN: [id number]  HPI  Mia Underwood is here for follow-up of her essential hypertension, hyperlipidemia, tobacco use disorder, major depression which is chronic and recurrent, vitamin D deficiency, and allergic rhinitis. Please see the A&P for the status of the pt's chronic medical problems.  Review of Systems  Constitutional: Positive for fatigue. Negative for activity change, appetite change and unexpected weight change.  HENT: Positive for congestion and rhinorrhea.   Respiratory: Negative for chest tightness, shortness of breath and wheezing.   Cardiovascular: Negative for chest pain, palpitations and leg swelling.  Gastrointestinal: Negative for abdominal distention, abdominal pain, constipation, diarrhea, nausea and vomiting.  Genitourinary: Negative for difficulty urinating.  Allergic/Immunologic: Positive for environmental allergies.  Psychiatric/Behavioral: Positive for dysphoric mood and sleep disturbance.      Objective:   Physical Exam  Constitutional: She is oriented to person, place, and time. She appears well-developed and well-nourished. No distress.  HENT:  Head: Normocephalic and atraumatic.  Eyes: Conjunctivae are normal. Right eye exhibits no discharge. Left eye exhibits no discharge. No scleral icterus.  Cardiovascular: Normal rate, regular rhythm and normal heart sounds. Exam reveals no gallop and no friction rub.  No murmur heard. Pulmonary/Chest: Effort normal and breath sounds normal. No respiratory distress. She has no wheezes. She has no rales.  Abdominal: Soft. Bowel sounds are normal. She exhibits no distension. There is no tenderness. There is no rebound and no guarding.  Musculoskeletal: Normal range of motion. She exhibits no edema, tenderness or deformity.  Neurological: She is alert and oriented to person, place, and time. She exhibits normal muscle tone.  Skin: Skin is  warm and dry. No rash noted. She is not diaphoretic.  Psychiatric: She has a normal mood and affect. Her behavior is normal. Judgment and thought content normal.  Nursing note and vitals reviewed.     Assessment & Plan:   Please see problem based charting.

## 2018-03-08 NOTE — Assessment & Plan Note (Signed)
Assessment  She has had considerable difficulty with her seasonal allergic rhinitis over the last week given the significant amount of tree pollen currently in the environment. That said she is taking Zyrtec as needed and feels this is effective.  Plan  We will continue with the Zyrtec and reassess the need for this therapy once we get through the heavy tree pollen season.

## 2018-03-08 NOTE — Assessment & Plan Note (Signed)
She has scheduled her DEXA scan and is otherwise up-to-date on her health care maintenance.

## 2018-03-08 NOTE — Assessment & Plan Note (Signed)
Assessment  She continues to smoke and is currently not ready to quit smoking although would like to quit smoking. Her housing is being remodeled and once this is complete she must stop smoking altogether, otherwise she risks being kicked out of the city's housing. She feels this is a good thing as it will motivate her to quit smoking at that time.  Plan  I asked her to call me and leave a message if she felt she needed pharmacologic assistance with smoking cessation. We could either use the nicotine patches or start Wellbutrin. She stated she would contact me if she felt she needed pharmacologic assistance. I encouraged her attitude on wanting to eventually quit and we discussed all of the benefits associated with such a move.

## 2018-03-08 NOTE — Assessment & Plan Note (Signed)
Assessment  Her PHQ-9 today was 1. She is followed closely by her therapist as well as her psychiatrist Dr. Casimiro Needle. Her current regimen includes sertraline 200 mg by mouth every morning and trazodone 100 mg by mouth every night.her main stressor at this time are conflicts with her 66 year old granddaughter for whom she cares. Most of these conflicts are related to her granddaughter's attitude in unappreciative this.  Plan  I encouraged Mia Underwood to continue following closely with her therapist and psychiatrist. She is planning to talk to her granddaughter's therapist and I felt this might be helpful in giving her an opportunity to provide her perspective on her granddaughter's behavior. We will reassess both her depression and stressors at home at the follow-up visit.

## 2018-03-08 NOTE — Assessment & Plan Note (Signed)
Assessment  Her blood pressure today was elevated at 196/92. She states she has been inconsistent with her antihypertensive regimen. We discussed my concerns about the dangers of long-standing hypertension and how well controlled it had been at the previous visit.  Plan  Given the excellent control at the last visit, I encouraged her to be more consistent with her antihypertensive regimen. This includes amlodipine 10 mg by mouth daily and lisinopril-hydrochlorothiazide 40-50 mg by mouth daily. We will reassess her compliance with and the efficacy of this antihypertensive regimen at the follow-up visit.

## 2018-03-08 NOTE — Assessment & Plan Note (Signed)
Assessment  She is tolerating the simvastatin 20 mg by mouth nightly without myalgias.  Plan  We will continue with this moderate intensity statin and assess for intolerances at the follow-up visit.

## 2018-03-08 NOTE — Patient Instructions (Addendum)
It was good to see you again.  1) Keep taking the medications as you are.  2) Call me if you need help with quitting smoking with medications.  3) I like the idea of you going to the gym and working on your health.  We may be able to get rid of several of your medications.  I will see you in 3 months, sooner if necessary

## 2018-03-18 ENCOUNTER — Encounter (HOSPITAL_COMMUNITY): Payer: Self-pay | Admitting: Licensed Clinical Social Worker

## 2018-03-18 ENCOUNTER — Ambulatory Visit (HOSPITAL_COMMUNITY): Payer: Medicare HMO | Admitting: Licensed Clinical Social Worker

## 2018-03-18 DIAGNOSIS — F339 Major depressive disorder, recurrent, unspecified: Secondary | ICD-10-CM

## 2018-03-18 NOTE — Progress Notes (Signed)
THERAPIST PROGRESS NOTE  Session Time: 9:10 -10am  Participation Level:Active  Behavioral Response:CasualAlert/sad  Type of Therapy: Individual Therapy  Treatment Goals addressed: Coping  Interventions:CBT/DBT  Summary: Mia Underwood a 67 y.o.femalewho presents for her individual counseling session. Pt discussed her psychiatric symptoms and current life events. Pt presents agitated today. She is tearful and tired. She just finished packing her apt and put all of her belongings in a POD. [facility] [employer] is renovating her complex and she is living in a hotel for 3 months. She likes it - "it'[place] like being on vacation." However she packed up her whole apt and is just tired. She and her granddaughter and still struggling to co-exist. Role played with pt - how to use consequences with her granddaughter. Discussed with pt the promises she made her daughter ; take care of my children. Asked open ended questions and used empathic reflection. Discussed happiness with pt. As she doesn't feel happy. "happiness comes from within."      Suicidal/Homicidal:Nowithout intent/plan  Therapist Response: Assessed pt'Underwood current functioning and reviewed progress. Assisted pt processing consequences,agitation, happiness, family relationships,  Assisted pt processing for management of stressors.  Plan: Return again in 2 weeks    Diagnosis:Axis I: Major Depression, recurent, chronic      [name],Mia Underwood, Mia Underwood 03/18/18

## 2018-04-01 ENCOUNTER — Encounter (HOSPITAL_COMMUNITY): Payer: Self-pay | Admitting: Licensed Clinical Social Worker

## 2018-04-01 ENCOUNTER — Ambulatory Visit (HOSPITAL_COMMUNITY): Payer: Medicare HMO | Admitting: Licensed Clinical Social Worker

## 2018-04-01 DIAGNOSIS — F339 Major depressive disorder, recurrent, unspecified: Secondary | ICD-10-CM

## 2018-04-01 NOTE — Progress Notes (Signed)
THERAPIST PROGRESS NOTE  Session Time: 9:10 -10am  Participation Level:Active  Behavioral Response:CasualAlert/Euthymic  Type of Therapy: Individual Therapy  Treatment Goals addressed: Coping  Interventions:CBT/DBT  Summary: Mia Underwood a 67 y.o.femalewho presents for her individual counseling session. Pt discussed her psychiatric symptoms and current life events. Pt presents euthymic today. She feels less depressed, but more anxious. She becomes most agitated dealing with her granddaughters. She feels her medications are working well with few depressive symptoms. Pt is enjoying living in the hotel with all the amenities, however it puts her in close contact (same bed) with her 49 yo granddaughter who lives with her.  They have communication issues, typical teenage issues, pt raising her granddaughter issues and more. Discussed consequences with pt and holding her granddaughter accountability for her actions and choices. Asked open ended questions and used empathic reflection. Pt had her hair colored and curled. She also bought a few sundresses for the summer. Congratulated pt on her self care and suggested she continue making her self care a priority.       Suicidal/Homicidal:Nowithout intent/plan  Therapist Response: Assessed pt's current functioning and reviewed progress. Assisted pt processing consequences, self care, communication, relationships,  Assisted pt processing for management of stressors.  Plan: Return again in 2 weeks    Diagnosis:Axis I: Major Depression, recurent, chronic      [place], LCAS 04/01/18

## 2018-04-10 ENCOUNTER — Ambulatory Visit (HOSPITAL_COMMUNITY): Payer: Medicare HMO | Admitting: Psychiatry

## 2018-04-10 ENCOUNTER — Encounter (HOSPITAL_COMMUNITY): Payer: Self-pay | Admitting: Psychiatry

## 2018-04-10 VITALS — BP 156/73 | HR 64 | Ht 61.0 in | Wt 126.0 lb

## 2018-04-10 DIAGNOSIS — F3342 Major depressive disorder, recurrent, in full remission: Secondary | ICD-10-CM

## 2018-04-10 MED ORDER — SERTRALINE HCL 100 MG PO TABS: ORAL_TABLET | ORAL | 5 refills | Status: DC

## 2018-04-10 MED ORDER — TRAZODONE HCL 50 MG PO TABS
ORAL_TABLET | ORAL | 5 refills | Status: DC
Start: 2018-04-10 — End: 2018-07-10

## 2018-04-10 NOTE — Progress Notes (Signed)
Psychiatric Initial Adult Assessment   Patient Identification: Mia Underwood MRN:  [id number] Date of Evaluation:  04/10/2018 Referral Source: [facility] [place] Chief Complaint:    Visit Diagnosis: Major depression  Today the patient seems to be stable. Her granddaughter gave birth to a healthy boy. The patient has a poor relationship with her granddaughter who had this child. On the other hand the patient is very involved with her other granddaughter lives with her. The patient is not compliant with her medications. For reasons she doesn't take her to Zoloft on a regular basis. Today we asked her history reduce it to taking 1 a day. The patient does take trazodone and sleeps fairly well. The patient is eating well.Physically she is no complaints other than a lot of gas. She'll be speaking to her primary care doctor about this chronic problem. She does eat well she has no evidence of psychosis. She drinks no alcohol uses no drugs. The patient is functioning very well.   Past Psychiatric History:   Previous Psychotropic Medications: Yes   Substance Abuse History in the last 12 months:  Yes.    Consequences of Substance Abuse:   Past Medical History:  Past Medical History:  Diagnosis Date  . Allergic rhinitis 05/04/2016  . Bilateral carpal tunnel syndrome 10/18/2010   Left > Right   . Diverticulosis of sigmoid colon 06/07/2010  . Essential hypertension 09/14/2009  . Hyperlipidemia 09/14/2009  . 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 Major depression, recurrent, chronic ([place]) 09/14/2009  . Marijuana abuse 04/04/2016  . Tobacco abuse 01/17/2016  . Vitamin D deficiency 04/04/2016    Past Surgical History:  Procedure Laterality Date  . ABDOMINAL HYSTERECTOMY  April 1996   For fibroids    Family Psychiatric History:   Family History:  Family History  Problem Relation Age of Onset  . Breast cancer Mother   . Depression Mother   . Hypertension Mother   . Dementia  Mother   . Alcoholism Father        Died in his 22's related to alcohol abuse  . Leukemia Sister 8  . Early death Brother        Died in a motor vehicle accident  . Heart attack Daughter 53       Per patient report, specifics unknown  . Diabetes Mellitus II Maternal Grandmother   . Diabetes Mellitus II Paternal Grandmother   . Healthy Sister   . Healthy Sister   . Arthritis Brother   . Healthy Brother   . Healthy Brother   . Healthy Son     Social History:   Social History   Socioeconomic History  . Marital status: Single    Spouse name: Not on file  . Number of children: 2  . Years of education: 12 grade  . Highest education level: Not on file  Occupational History  . Occupation: Leisure centre manager:  [employer]    Comment: Retired  Scientific laboratory technician  . Financial resource strain: Not on file  . Food insecurity:    Worry: Not on file    Inability: Not on file  . Transportation needs:    Medical: Not on file    Non-medical: Not on file  Tobacco Use  . Smoking status: Current Some Day Smoker    Packs/day: 0.20    Types: Cigarettes    Start date: 12/02/1973  . Smokeless tobacco: Never Used  . Tobacco comment: Stopped in 1980 restarted in 1985.  1pack every 8 days.  Stopped on 01/20/2016. Restarted again.  Substance and Sexual Activity  . Alcohol use: Yes    Alcohol/week: 8.4 - 9.0 oz    Types: 2 - 3 Cans of beer, 12 Standard drinks or equivalent per week    Comment: occasional weekend use  . Drug use: No  . Sexual activity: Never  Lifestyle  . Physical activity:    Days per week: Not on file    Minutes per session: Not on file  . Stress: Not on file  Relationships  . Social connections:    Talks on phone: Not on file    Gets together: Not on file    Attends religious service: Not on file    Active member of club or organization: Not on file    Attends meetings of clubs or organizations: Not on file    Relationship status: Not on file  Other Topics Concern  . Not  on file  Social History Narrative   Graduated from [employer] in [place].  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    Additional Social History:   Allergies:   Allergies  Allergen Reactions  . Penicillins Hives  . Naproxen Rash    Metabolic Disorder Labs: No results found for: HGBA1C, MPG No results found for: PROLACTIN Lab Results  Component Value Date   CHOL 187 08/03/2016   TRIG 66 08/03/2016   HDL 90 08/03/2016   CHOLHDL 2.1 08/03/2016   VLDL 20 11/01/2010   LDLCALC 84 08/03/2016   LDLCALC 127 (H) 01/03/2016     Current Medications: Current Outpatient Medications  Medication Sig Dispense Refill  . amLODipine (NORVASC) 10 MG tablet Take 1 tablet (10 mg total) by mouth daily. 90 tablet 3  . aspirin EC 81 MG tablet Take 81 mg by mouth daily.    . calcium carbonate (OS-CAL) 600 MG TABS tablet Take 1 tablet (600 mg total) by mouth 2 (two) times daily with a meal. 180 tablet 3  . cetirizine (ZYRTEC) 10 MG tablet TAKE 1 TABLET(10 MG) BY MOUTH AT BEDTIME 90 tablet 3  . cholecalciferol (VITAMIN D) 1000 units tablet Take 1 tablet (1,000 Units total) by mouth daily. 90 tablet 3  . gabapentin (NEURONTIN) 600 MG tablet Take 1 tablet (600 mg total) by mouth at bedtime. 90 tablet 3  . lisinopril-hydrochlorothiazide (PRINZIDE,ZESTORETIC) 20-25 MG tablet Take 2 tablets by mouth daily. 180 tablet 3  . sertraline (ZOLOFT) 100 MG tablet 2  qam 60 tablet 5  . simvastatin (ZOCOR) 20 MG tablet Take 1 tablet (20 mg total) by mouth daily. 90 tablet 3  . traZODone (DESYREL) 50 MG tablet 2  qhs 60 tablet 5   No current facility-administered medications for this visit.     Neurologic: Headache: No Seizure: No Paresthesias:No  Musculoskeletal: Strength & Muscle Tone: within normal limits Gait & Station: normal Patient leans: N/A  Psychiatric Specialty Exam: ROS  Blood pressure (!) 156/73, pulse 64, height 5\' 1"  (1.549 m), weight 126 lb (57.2 kg), SpO2 96 %.Body  mass index is 23.81 kg/m.  General Appearance: Casual  Eye Contact:  Fair  Speech:  Clear and Coherent  Volume:  Normal  Mood:  Depressed  Affect:  Blunt  Thought Process:  Goal Directed  Orientation:  Full (Time, Place, and Person)  Thought Content:  WDL  Suicidal Thoughts:  No  Homicidal Thoughts:  No  Memory:  NA  Judgement:  Good  Insight:  Good  Psychomotor Activity:  Normal  Concentration:    Recall:  [place] of Knowledge:Good  Language: Poor  Akathisia:  No  Handed:  Right  AIMS (if indicated):    Assets:  Desire for Improvement  ADL's:  Intact  Cognition: WNL  Sleep:      Treatment Plan Summary: 5/15/20192:31 PM   Today this patient agreed to take Zoloft 100 mg 1 a day.She continues coming here for therapy. I think that most important intervention for her. Rather take just 100 mg of Zoloft regular basis take rather than one an erratic basis. he likes taking trazodone as it does seem to help.this patient she'll return to see me in 3 months for a 15 minute visit. Think she is very stable functioning well.

## 2018-04-15 ENCOUNTER — Ambulatory Visit (HOSPITAL_COMMUNITY): Payer: Self-pay | Admitting: Licensed Clinical Social Worker

## 2018-04-15 ENCOUNTER — Ambulatory Visit
Admission: RE | Admit: 2018-04-15 | Discharge: 2018-04-15 | Disposition: A | Discharge status: Home / Self Care | Payer: Medicare HMO | Source: Ambulatory Visit | Attending: Internal Medicine | Admitting: Internal Medicine

## 2018-04-15 DIAGNOSIS — Z1231 Encounter for screening mammogram for malignant neoplasm of breast: Secondary | ICD-10-CM

## 2018-04-29 ENCOUNTER — Ambulatory Visit (HOSPITAL_COMMUNITY): Payer: Self-pay | Admitting: Licensed Clinical Social Worker

## 2018-05-16 ENCOUNTER — Ambulatory Visit (HOSPITAL_COMMUNITY): Payer: Self-pay | Admitting: Licensed Clinical Social Worker

## 2018-06-26 ENCOUNTER — Encounter (HOSPITAL_COMMUNITY): Payer: Self-pay | Admitting: Licensed Clinical Social Worker

## 2018-06-26 ENCOUNTER — Ambulatory Visit (INDEPENDENT_AMBULATORY_CARE_PROVIDER_SITE_OTHER): Payer: Medicare HMO | Admitting: Licensed Clinical Social Worker

## 2018-06-26 DIAGNOSIS — F339 Major depressive disorder, recurrent, unspecified: Secondary | ICD-10-CM
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
## 2018-06-27 ENCOUNTER — Other Ambulatory Visit: Payer: Self-pay | Admitting: *Deleted

## 2018-06-27 DIAGNOSIS — R252 Cramp and spasm: Secondary | ICD-10-CM

## 2018-06-27 DIAGNOSIS — I10 Essential (primary) hypertension: Secondary | ICD-10-CM

## 2018-06-27 DIAGNOSIS — E7800 Pure hypercholesterolemia, unspecified: Secondary | ICD-10-CM

## 2018-06-27 MED ORDER — LISINOPRIL-HYDROCHLOROTHIAZIDE 20-25 MG PO TABS: 2.0000 | ORAL_TABLET | Freq: Every day | ORAL | 3 refills | Status: DC

## 2018-06-27 MED ORDER — SIMVASTATIN 20 MG PO TABS: 20.0000 mg | ORAL_TABLET | Freq: Every day | ORAL | 3 refills | Status: DC

## 2018-06-27 MED ORDER — AMLODIPINE BESYLATE 10 MG PO TABS: 10.0000 mg | ORAL_TABLET | Freq: Every day | ORAL | 3 refills | Status: DC

## 2018-06-27 MED ORDER — GABAPENTIN 600 MG PO TABS: 600.0000 mg | ORAL_TABLET | Freq: Every day | ORAL | 3 refills | Status: DC

## 2018-06-27 NOTE — Telephone Encounter (Signed)
Pt states she takes Gabapentin for intermittent  "muscle cramping in her legs". Last taken in June. She thought she had to take it everyday. States when this happens ,she just have to "rest her legs but it doesn't happen all the time". Also wanted to schedule f/u appt - doctor's schedule not out yet; asked to call back beginning of Sept, stated she will.

## 2018-06-27 NOTE — Telephone Encounter (Signed)
I think this request came to attending pool accidentally, I will forward to patient's PCP.

## 2018-06-27 NOTE — Telephone Encounter (Signed)
Requesting rxs sent to [place]. Thanks

## 2018-07-04 ENCOUNTER — Other Ambulatory Visit: Payer: Self-pay | Admitting: Internal Medicine

## 2018-07-04 DIAGNOSIS — I10 Essential (primary) hypertension: Secondary | ICD-10-CM

## 2018-07-04 DIAGNOSIS — E7800 Pure hypercholesterolemia, unspecified: Secondary | ICD-10-CM

## 2018-07-10 ENCOUNTER — Encounter (HOSPITAL_COMMUNITY): Payer: Self-pay | Admitting: Psychiatry

## 2018-07-10 ENCOUNTER — Ambulatory Visit (HOSPITAL_COMMUNITY): Payer: Medicare HMO | Admitting: Psychiatry

## 2018-07-10 VITALS — BP 140/76 | HR 64 | Ht 61.0 in | Wt 126.0 lb

## 2018-07-10 DIAGNOSIS — F321 Major depressive disorder, single episode, moderate: Secondary | ICD-10-CM

## 2018-07-10 DIAGNOSIS — Z818 Family history of other mental and behavioral disorders: Secondary | ICD-10-CM

## 2018-07-10 DIAGNOSIS — Z811 Family history of alcohol abuse and dependence: Secondary | ICD-10-CM

## 2018-07-10 DIAGNOSIS — F17210 Nicotine dependence, cigarettes, uncomplicated: Secondary | ICD-10-CM | POA: Diagnosis not present

## 2018-07-10 MED ORDER — SERTRALINE HCL 100 MG PO TABS
ORAL_TABLET | ORAL | 5 refills | Status: DC
Start: 2018-07-10 — End: 2018-07-12

## 2018-07-10 MED ORDER — TRAZODONE HCL 50 MG PO TABS: ORAL_TABLET | ORAL | 5 refills | Status: DC

## 2018-07-10 NOTE — Progress Notes (Signed)
Psychiatric Initial Adult Assessment   Patient Identification: Mia Underwood MRN:  [id number] Date of Evaluation:  07/10/2018 Referral Source: [facility] Chief Complaint:  Major depression  Visit Diagnosis: Major depression Today the patient is doing well. She is stable. Her biggest stresses has to do with her grandchildren. Her 66 year old grandson is incarcerated. Her 10 year old granddaughter lives with her and goes to middle college and gives her some problems to get up to go to school. But overall she's pretty well. 66 year old her doesn't live with her recently for birth to a healthy boy. This patient is the one daughter died a few years ago and it out taking care of her 3 grandchildren. The patient does not have much of a social life. Her mood is actually good. She feels confident. She denies problems with sleep or appetite or energy. She's not suicidal. She drinks no alcohol uses no drugs she is not psychotic at all. The patient in general likes life. She likes her home. Financially she is stable.She has minimal significant stressors other than dealing with her grandchildren who are grown up seem to BE ready to leave. She visits the grandson in prison. The other 2 granddaughters are generally doing well. This patient denies chest pain or shortness of breath. She denies any neurological symptoms.   Past Psychiatric History:   Previous Psychotropic Medications: Yes   Substance Abuse History in the last 12 months:  Yes.    Consequences of Substance Abuse:   Past Medical History:  Past Medical History:  Diagnosis Date  . Allergic rhinitis 05/04/2016  . Bilateral carpal tunnel syndrome 10/18/2010   Left > Right   . Diverticulosis of sigmoid colon 06/07/2010  . Essential hypertension 09/14/2009  . Hyperlipidemia 09/14/2009  . 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 Major depression, recurrent, chronic ([place]) 09/14/2009  . Marijuana abuse 04/04/2016  . Tobacco abuse  01/17/2016  . Vitamin D deficiency 04/04/2016    Past Surgical History:  Procedure Laterality Date  . ABDOMINAL HYSTERECTOMY  April 1996   For fibroids    Family Psychiatric History:   Family History:  Family History  Problem Relation Age of Onset  . Breast cancer Mother   . Depression Mother   . Hypertension Mother   . Dementia Mother   . Alcoholism Father        Died in his 67's related to alcohol abuse  . Leukemia Sister 8  . Early death Brother        Died in a motor vehicle accident  . Heart attack Daughter 81       Per patient report, specifics unknown  . Diabetes Mellitus II Maternal Grandmother   . Diabetes Mellitus II Paternal Grandmother   . Healthy Sister   . Healthy Sister   . Arthritis Brother   . Healthy Brother   . Healthy Brother   . Healthy Son   . Breast cancer Maternal Aunt   . Breast cancer Cousin   . Breast cancer Maternal Aunt     Social History:   Social History   Socioeconomic History  . Marital status: Single    Spouse name: Not on file  . Number of children: 2  . Years of education: 12 grade  . Highest education level: Not on file  Occupational History  . 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 Financial resource strain: Not on file  . Food insecurity:  Worry: Not on file    Inability: Not on file  . Transportation needs:    Medical: Not on file    Non-medical: Not on file  Tobacco Use  . Smoking status: Current Some Day Smoker    Packs/day: 0.20    Types: Cigarettes    Start date: 12/02/1973  . Smokeless tobacco: Never Used  . Tobacco comment: Stopped in 1980 restarted in 1985. 1pack every 8 days.  Stopped on 01/20/2016. Restarted again.  Substance and Sexual Activity  . Alcohol use: Yes    Alcohol/week: 6.0 standard drinks    Types: 6 Cans of beer per week    Comment: occasional weekend use - has cut back 6 pack a week  . Drug use: No  . Sexual activity: Never  Lifestyle  .  Physical activity:    Days per week: Not on file    Minutes per session: Not on file  . Stress: Not on file  Relationships  . Social connections:    Talks on phone: Not on file    Gets together: Not on file    Attends religious service: Not on file    Active member of club or organization: Not on file    Attends meetings of clubs or organizations: Not on file    Relationship status: Not on file  Other Topics Concern  . Not on file  Social History Narrative   Graduated from [employer] in [place].  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    Additional Social History:   Allergies:   Allergies  Allergen Reactions  . Penicillins Hives  . Naproxen Rash    Metabolic Disorder Labs: No results found for: HGBA1C, MPG No results found for: PROLACTIN Lab Results  Component Value Date   CHOL 187 08/03/2016   TRIG 66 08/03/2016   HDL 90 08/03/2016   CHOLHDL 2.1 08/03/2016   VLDL 20 11/01/2010   LDLCALC 84 08/03/2016   LDLCALC 127 (H) 01/03/2016     Current Medications: Current Outpatient Medications  Medication Sig Dispense Refill  . amLODipine (NORVASC) 10 MG tablet Take 1 tablet (10 mg total) by mouth daily. 90 tablet 3  . aspirin EC 81 MG tablet Take 81 mg by mouth daily.    Marland Kitchen gabapentin (NEURONTIN) 600 MG tablet Take 1 tablet (600 mg total) by mouth at bedtime. 90 tablet 3  . lisinopril-hydrochlorothiazide (PRINZIDE,ZESTORETIC) 20-25 MG tablet Take 2 tablets by mouth daily. 180 tablet 3  . sertraline (ZOLOFT) 100 MG tablet 1  qam 60 tablet 5  . simvastatin (ZOCOR) 20 MG tablet Take 1 tablet (20 mg total) by mouth daily. 90 tablet 3  . traZODone (DESYREL) 50 MG tablet 2  qhs 60 tablet 5  . calcium carbonate (OS-CAL) 600 MG TABS tablet Take 1 tablet (600 mg total) by mouth 2 (two) times daily with a meal. (Patient not taking: Reported on 07/10/2018) 180 tablet 3  . cetirizine (ZYRTEC) 10 MG tablet TAKE 1 TABLET(10 MG) BY MOUTH AT BEDTIME (Patient not  taking: Reported on 07/10/2018) 90 tablet 3  . cholecalciferol (VITAMIN D) 1000 units tablet Take 1 tablet (1,000 Units total) by mouth daily. (Patient not taking: Reported on 07/10/2018) 90 tablet 3   No current facility-administered medications for this visit.     Neurologic: Headache: No Seizure: No Paresthesias:No  Musculoskeletal: Strength & Muscle Tone: within normal limits Gait & Station: normal Patient leans: N/A  Psychiatric Specialty Exam: ROS  Blood  pressure 140/76, pulse 64, height 5\' 1"  (1.549 m), weight 126 lb (57.2 kg), SpO2 95 %.Body mass index is 23.81 kg/m.  General Appearance: Casual  Eye Contact:  Fair  Speech:  Clear and Coherent  Volume:  Normal  Mood:  Depressed  Affect:  Blunt  Thought Process:  Goal Directed  Orientation:  Full (Time, Place, and Person)  Thought Content:  WDL  Suicidal Thoughts:  No  Homicidal Thoughts:  No  Memory:  NA  Judgement:  Good  Insight:  Good  Psychomotor Activity:  Normal  Concentration:    Recall:  [place] of Knowledge:Good  Language: Poor  Akathisia:  No  Handed:  Right  AIMS (if indicated):    Assets:  Desire for Improvement  ADL's:  Intact  Cognition: WNL  Sleep:      Treatment Plan Summary: 8/14/20192:13 PM  Today the patient is #1 problem is that of major depression. She takes Zoloft on an erratic basis. Despite the fact that we reduce the dose to 100 mg she sometimes takes 200. Sometimes she doesn't take any. I insisted to her that she only takes 100 mg extended morning take it at night. The patient's second problem is that of insomnia. She takes trazodone 1 erratic basis. This patient treats reveals with her depression by being in therapy. She apparently is very good relationship in the therapy setting. The patient overall is doing quite well. She denies severe depression and now is minimal to mild. She denies fatigue or problems with her energy. She denies any significant medical problems at this time.  This patient return to see me in 5 months for a 15 minute visit.

## 2018-07-11 ENCOUNTER — Other Ambulatory Visit: Payer: Self-pay | Admitting: Internal Medicine

## 2018-07-11 DIAGNOSIS — E559 Vitamin D deficiency, unspecified: Secondary | ICD-10-CM

## 2018-07-11 DIAGNOSIS — J302 Other seasonal allergic rhinitis: Secondary | ICD-10-CM

## 2018-07-11 MED ORDER — CALCIUM CARBONATE 600 MG PO TABS: 600.0000 mg | ORAL_TABLET | Freq: Two times a day (BID) | ORAL | 3 refills | Status: DC

## 2018-07-11 MED ORDER — CETIRIZINE HCL 10 MG PO TABS: 10.0000 mg | ORAL_TABLET | Freq: Every day | ORAL | 3 refills | Status: DC

## 2018-07-11 NOTE — Telephone Encounter (Signed)
Pt is requesting all medicine be sent to [facility], they are also requesting they are in 90-day supply. Pt contact # [phone]

## 2018-07-12 ENCOUNTER — Other Ambulatory Visit (HOSPITAL_COMMUNITY): Payer: Self-pay

## 2018-07-12 MED ORDER — TRAZODONE HCL 50 MG PO TABS: ORAL_TABLET | ORAL | 5 refills | Status: DC

## 2018-07-12 MED ORDER — SERTRALINE HCL 100 MG PO TABS: ORAL_TABLET | ORAL | 5 refills | Status: DC

## 2018-07-16 ENCOUNTER — Telehealth: Payer: Self-pay

## 2018-07-16 NOTE — Telephone Encounter (Signed)
Pt want to notified the doctor that she is now [place].

## 2018-07-16 NOTE — Telephone Encounter (Signed)
done

## 2018-08-12 ENCOUNTER — Encounter (HOSPITAL_COMMUNITY): Payer: Self-pay | Admitting: Licensed Clinical Social Worker

## 2018-08-12 ENCOUNTER — Ambulatory Visit (HOSPITAL_COMMUNITY): Payer: Medicare HMO | Admitting: Licensed Clinical Social Worker

## 2018-08-12 DIAGNOSIS — F339 Major depressive disorder, recurrent, unspecified: Secondary | ICD-10-CM

## 2018-08-13 ENCOUNTER — Encounter (HOSPITAL_COMMUNITY): Payer: Self-pay | Admitting: Licensed Clinical Social Worker

## 2018-08-13 NOTE — Progress Notes (Signed)
THERAPIST PROGRESS NOTE  Session Time: 2:00 -2:26pm  Participation Level:Active  Behavioral Response:CasualAlert/Euthymic  Type of Therapy: Individual Therapy  Treatment Goals addressed: Coping  Interventions:CBT/DBT  Summary: Mia Underwood a 67 y.o.femalewho presents for her individual counseling session. Pt discussed her psychiatric symptoms and current life events. Pt presents anxious today. She feels less depressed, but more anxious. She becomes most agitated dealing with her 1 granddaughter who has a new baby. Pt saw her psychiatrist, Dr. Casimiro Needle, who kept her meds the same. She continues to feel her medications are working well with few depressive symptoms. Pt is back in her house, having lived in a hotel for 2-3 months due to her housing being renovated. Pt is limiting access to her granddaughter with the baby. This helps pt control her anxiety. The granddaughter that lives with the pt is doing well, relationship improved. Pt is focusing her time on this grandchild. Role played talking to the other granddaughter. Pt has strong negative feelings about her. Discussed relationship circles with pt.         Suicidal/Homicidal:Nowithout intent/plan  Therapist Response: Assessed pt's current functioning and reviewed progress. Assisted pt processing  communication, relationships, relationship circles.  Assisted pt processing for management of stressors.  Plan: Return again in 2 weeks    Diagnosis:Axis I: Major Depression, recurent, chronic      Mia Underwood S, LCAS 08/12/18

## 2018-08-27 ENCOUNTER — Ambulatory Visit (HOSPITAL_COMMUNITY): Payer: Self-pay | Admitting: Licensed Clinical Social Worker

## 2018-09-09 ENCOUNTER — Ambulatory Visit (INDEPENDENT_AMBULATORY_CARE_PROVIDER_SITE_OTHER): Payer: Medicare HMO | Admitting: Internal Medicine

## 2018-09-09 ENCOUNTER — Encounter: Payer: Self-pay | Admitting: Internal Medicine

## 2018-09-09 ENCOUNTER — Other Ambulatory Visit: Payer: Self-pay

## 2018-09-09 VITALS — BP 134/66 | HR 76 | Temp 99.0°F | Wt 129.7 lb

## 2018-09-09 DIAGNOSIS — J301 Allergic rhinitis due to pollen: Secondary | ICD-10-CM

## 2018-09-09 DIAGNOSIS — I10 Essential (primary) hypertension: Secondary | ICD-10-CM

## 2018-09-09 DIAGNOSIS — E785 Hyperlipidemia, unspecified: Secondary | ICD-10-CM | POA: Diagnosis not present

## 2018-09-09 DIAGNOSIS — Z79899 Other long term (current) drug therapy: Secondary | ICD-10-CM

## 2018-09-09 DIAGNOSIS — Z23 Encounter for immunization: Secondary | ICD-10-CM | POA: Diagnosis not present

## 2018-09-09 DIAGNOSIS — E559 Vitamin D deficiency, unspecified: Secondary | ICD-10-CM | POA: Diagnosis not present

## 2018-09-09 DIAGNOSIS — R252 Cramp and spasm: Secondary | ICD-10-CM | POA: Insufficient documentation

## 2018-09-09 DIAGNOSIS — Z720 Tobacco use: Secondary | ICD-10-CM | POA: Diagnosis not present

## 2018-09-09 DIAGNOSIS — J309 Allergic rhinitis, unspecified: Secondary | ICD-10-CM | POA: Diagnosis not present

## 2018-09-09 DIAGNOSIS — F339 Major depressive disorder, recurrent, unspecified: Secondary | ICD-10-CM | POA: Diagnosis not present

## 2018-09-09 DIAGNOSIS — E7800 Pure hypercholesterolemia, unspecified: Secondary | ICD-10-CM

## 2018-09-09 DIAGNOSIS — Z0000 Encounter for general adult medical examination without abnormal findings: Secondary | ICD-10-CM

## 2018-09-09 MED ORDER — ASPIRIN EC 81 MG PO TBEC: 81.0000 mg | DELAYED_RELEASE_TABLET | Freq: Every day | ORAL | 3 refills | Status: DC

## 2018-09-09 MED ORDER — GABAPENTIN 600 MG PO TABS: 600.0000 mg | ORAL_TABLET | Freq: Two times a day (BID) | ORAL | 3 refills | Status: DC

## 2018-09-09 NOTE — Assessment & Plan Note (Signed)
She was given the flu vaccination today.  We will discuss the issue of DEXA scanning at the follow-up visit.  She is otherwise up-to-date on her healthcare maintenance.

## 2018-09-09 NOTE — Assessment & Plan Note (Addendum)
Assessment  Her mood is markedly improved from most visits.  She no longer is dwelling upon the difficulties her mother has been giving her.  She also seems to be coping much better with her teenage granddaughter's attitude and cracked a smile when discussing all the preparations for her senior pictures.  That said, she is looking forward to the day when her youngest granddaughter will be going to college.  She is currently applying to colleges and is hopeful she will end up in [place].  She states she has been compliant with the sertraline 100 mg by mouth daily and trazodone 50 mg by mouth at night.  Her PHQ 9 today was 6 which appears to be up from 1 in the middle of September.  That said, her regimen is felt to be effective and after our time together I believe that her coping skills are markedly improved, leading to a higher quality of life for Mia Underwood.  Plan  She will continue the sertraline at 100 mg by mouth daily and trazodone at 50 mg by mouth each night.  She continues to follow with her therapist and Dr. Casimiro Needle.  We will reassess her depressive symptoms via PHQ 9 and her coping skills at the follow-up visit.

## 2018-09-09 NOTE — Progress Notes (Signed)
   Subjective:    Patient ID: Mia Underwood, female    DOB: 11/18/1951, 67 y.o.   MRN: [id number]  HPI  Mia Underwood is here for follow-up of her major depressive disorder, hypertension, hyperlipidemia, vitamin D deficiency, tobacco abuse, and allergic rhinitis. Please see the A&P for the status of the pt's chronic medical problems.  Review of Systems  Constitutional: Negative for activity change, appetite change and unexpected weight change.  HENT: Positive for postnasal drip, rhinorrhea and sinus pressure.   Respiratory: Negative for chest tightness, shortness of breath and wheezing.   Cardiovascular: Negative for chest pain, palpitations and leg swelling.  Gastrointestinal: Negative for abdominal pain, constipation, diarrhea, nausea and vomiting.  Genitourinary: Negative for difficulty urinating.  Musculoskeletal: Negative for arthralgias, back pain, joint swelling and myalgias.       Occasional cramping of the hands and legs.  Skin: Negative for rash and wound.  Allergic/Immunologic: Positive for environmental allergies.  Neurological: Positive for numbness.       Occasional numbness on the lateral aspect of the left leg from below the knee to the foot.  Worsened when she went from BID to QD gabapentin.  Psychiatric/Behavioral: Negative for dysphoric mood. The patient is not nervous/anxious.       Objective:   Physical Exam  Constitutional: She is oriented to person, place, and time. She appears well-developed and well-nourished. No distress.  HENT:  Head: Normocephalic and atraumatic.  Eyes: Conjunctivae are normal. Right eye exhibits no discharge. Left eye exhibits no discharge. No scleral icterus.  Cardiovascular: Normal rate, regular rhythm and normal heart sounds. Exam reveals no gallop and no friction rub.  No murmur heard. Pulmonary/Chest: Effort normal and breath sounds normal. No stridor. No respiratory distress. She has no wheezes. She has no rales.  Abdominal: Soft.  Bowel sounds are normal. She exhibits no distension. There is no tenderness. There is no rebound and no guarding.  Musculoskeletal: Normal range of motion. She exhibits no edema, tenderness or deformity.  Neurological: She is alert and oriented to person, place, and time. She exhibits normal muscle tone. Coordination normal.  Skin: Skin is warm and dry. No rash noted. She is not diaphoretic. No erythema.  Psychiatric: She has a normal mood and affect. Her behavior is normal. Judgment and thought content normal.  Nursing note and vitals reviewed.     Assessment & Plan:   Please see problem based charting.

## 2018-09-09 NOTE — Assessment & Plan Note (Signed)
Assessment  Her allergic rhinitis persists, but the symptoms are well controlled with as needed antihistamine therapy.  Plan  We will continue with the cetirizine 10 mg by mouth every 24 hours as needed for her allergic rhinitis symptoms.  We will reassess the efficacy of this therapy at the follow-up visit.

## 2018-09-09 NOTE — Assessment & Plan Note (Signed)
>>  ASSESSMENT AND PLAN FOR BILATERAL LEG CRAMPS WRITTEN ON 09/09/2018  3:30 PM BY Doneen Poisson, MD  Assessment  She has cramps of the leg and hands of unclear etiology.  Plan  Given she is on hydrochlorothiazide 50 mg by mouth daily we will check a basic metabolic panel to assess if there may be electrolyte abnormalities associated with this medication.  If the results are pending at the time of this dictation.

## 2018-09-09 NOTE — Assessment & Plan Note (Signed)
Assessment  She has not been taking her supplemental vitamin D.  Plan  We have obtained a vitamin D level today to assess the status of her deficiency and it is pending at the time of this dictation.  If she remains deficient we will restart the oral vitamin D therapy.

## 2018-09-09 NOTE — Assessment & Plan Note (Signed)
Assessment  She has been working on smoking cessation over the last several months given the need to not smoke in public housing.  She is down to about 1 pack every week and a half.  Plan  Over the last year her weight is up approximately 10 pounds which is consistent with weight gain associated with smoking cessation.  We discussed the importance of healthier snacks and helping her maintain her weight despite the smoking cessation attempts.  She is not interested in any pharmacologic assistance at this time.  She was praised on her efforts to quit smoking and we will reassess her success at the follow-up visit.

## 2018-09-09 NOTE — Assessment & Plan Note (Signed)
Assessment  Her blood pressure today is at target at 134/66.  This is on amlodipine 10 mg by mouth daily and lisinopril-hydrochlorothiazide 40-50 milligrams daily.  She is tolerating this regimen well.  Plan  We will continue the amlodipine at 10 mg by mouth daily and lisinopril-hydrochlorothiazide 40-50 mg daily.  A basic metabolic panel was obtained at this visit and is pending at the time of this dictation.  We will reassess the efficacy of this regimen at the follow-up visit.

## 2018-09-09 NOTE — Patient Instructions (Addendum)
It was great to see you again.  You are doing such a good job with your life.  1) Keep taking the medications as you are.  You can increase the gabapentin to 600 mg twice a day for the numbness.  2) We gave you the flu shot today.  3) I checked some blood work today.  I will call you with the results.  I will see you back in 6 months, sooner if necessary.

## 2018-09-09 NOTE — Assessment & Plan Note (Signed)
Assessment  She is tolerating the simvastatin 20 mg by mouth daily without myalgias.  Plan  We will continue with this moderate intensity statin and assess for intolerances at the follow-up visit.

## 2018-09-09 NOTE — Assessment & Plan Note (Signed)
Assessment  She has cramps of the leg and hands of unclear etiology.  Plan  Given she is on hydrochlorothiazide 50 mg by mouth daily we will check a basic metabolic panel to assess if there may be electrolyte abnormalities associated with this medication.  If the results are pending at the time of this dictation.

## 2018-09-10 ENCOUNTER — Encounter (HOSPITAL_COMMUNITY): Payer: Self-pay | Admitting: Licensed Clinical Social Worker

## 2018-09-10 ENCOUNTER — Ambulatory Visit (HOSPITAL_COMMUNITY): Payer: Medicare HMO | Admitting: Licensed Clinical Social Worker

## 2018-09-10 DIAGNOSIS — F339 Major depressive disorder, recurrent, unspecified: Secondary | ICD-10-CM | POA: Diagnosis not present

## 2018-09-10 LAB — BMP8+ANION GAP
ANION GAP: 15 mmol/L (ref 10.0–18.0)
BUN / CREAT RATIO: 24 (ref 12–28)
BUN: 16 mg/dL (ref 8–27)
CO2: 25 mmol/L (ref 20–29)
CREATININE: 0.66 mg/dL (ref 0.57–1.00)
Calcium: 9.7 mg/dL (ref 8.7–10.3)
Chloride: 102 mmol/L (ref 96–106)
GFR calc Af Amer: 107 mL/min/{1.73_m2} (ref >59)
GFR calc non Af Amer: 93 mL/min/{1.73_m2} (ref >59)
Glucose: 97 mg/dL (ref 65–99)
Potassium: 4.3 mmol/L (ref 3.5–5.2)
SODIUM: 142 mmol/L (ref 134–144)

## 2018-09-10 LAB — VITAMIN D 25 HYDROXY (VIT D DEFICIENCY, FRACTURES): Vit D, 25-Hydroxy: 20.8 ng/mL — ABNORMAL LOW (ref 30.0–100.0)

## 2018-09-10 NOTE — Progress Notes (Signed)
THERAPIST PROGRESS NOTE  Session Time: 9/20-10:05am  Participation Level:Active  Behavioral Response:CasualAlert/Euthymic  Type of Therapy: Individual Therapy  Treatment Goals addressed: Improve psychiatric symptoms, Controlled Behavior, Moderated Mood, Improve Unhelpful Thought Patterns, Emotional Regulation Skills (Moderate moods, anger management, stress management), Feel and express a full Range of Emotions, Learn about Diagnosis, Healthy Coping Skills.     Interventions:CBT/DBT  Summary: Mia Underwood a 67 y.o.femalewho presents for her individual counseling session. Pt discussed her psychiatric symptoms and current life events. Pt presents wnl today. She has decided that she can't control other people (family) in her life. She tries to help who she can but it never seems to be enough. She is assisting her granddaughter who lives with her, preparing for college and sr year in high school. She still struggles with the relationship with the other granddaughter where she tries to help her and her baby, but it's never enough. Pt shared she has not been to see her mother but keeps up with her care through her siblings. Discussed closure with pt about her mother. Pt agreed she needs to go and see her and say her goodbyes. Pt shared she does have 2 girlfriends that are her support system. Validated pt on her self care choices and suggested she continue with her self care.    Suicidal/Homicidal:Nowithout intent/plan  Therapist Response: Assessed pt's current functioning and reviewed progress. Assisted pt processing  communication, boundaries, support system, self care, closure with mother.  Assisted pt processing for management of stressors.  Plan: Return again in 2 weeks    Diagnosis:Axis I: Major Depression, recurent, chronic      [place], LCAS 09/10/18

## 2018-09-11 ENCOUNTER — Telehealth: Payer: Self-pay

## 2018-09-11 MED ORDER — VITAMIN D 1000 UNITS PO TABS: 1000.0000 [IU] | ORAL_TABLET | Freq: Every day | ORAL | 3 refills | Status: DC

## 2018-09-11 NOTE — Progress Notes (Signed)
Patient ID: Mia Underwood, female   DOB: 03-07-1951, 67 y.o.   MRN: [id number]  BMP K 4.3, Cr 0.66  Vit D 20.8  She remains vitamin D deficient.  I called her with this information and she is willing to restart the vitamin D.  I have sent this prescription to her mail order pharmacy.

## 2018-09-11 NOTE — Telephone Encounter (Signed)
Requesting lab results. Please call back.  

## 2018-09-11 NOTE — Addendum Note (Signed)
Addended by: Oval Linsey D on: 09/11/2018 05:36 PM   Modules accepted: Orders

## 2018-09-12 NOTE — Telephone Encounter (Signed)
I called her with the results.  Thank You.

## 2018-09-12 NOTE — Telephone Encounter (Signed)
Pt states she was at the bus stop this [place] and missed Dr Caroline More call -  Wants to know if he can call her back ay [phone]?

## 2018-09-12 NOTE — Telephone Encounter (Signed)
Pt calling back regarding lab results; pt contact [phone]

## 2018-09-16 NOTE — Telephone Encounter (Signed)
I was able to speak with Mia Underwood about her labs before the weekend.  Thank You.

## 2018-09-24 ENCOUNTER — Encounter (HOSPITAL_COMMUNITY): Payer: Self-pay | Admitting: Licensed Clinical Social Worker

## 2018-09-24 ENCOUNTER — Ambulatory Visit (HOSPITAL_COMMUNITY): Payer: Medicare HMO | Admitting: Licensed Clinical Social Worker

## 2018-09-24 DIAGNOSIS — F339 Major depressive disorder, recurrent, unspecified: Secondary | ICD-10-CM

## 2018-09-24 NOTE — Progress Notes (Signed)
THERAPIST PROGRESS NOTE  Session Time: 9:10-10:00am  Participation Level:Active  Behavioral Response:CasualAlert/Euthymic  Type of Therapy: Individual Therapy  Treatment Goals addressed: Improve psychiatric symptoms, Controlled Behavior, Moderated Mood, Improve Unhelpful Thought Patterns, Emotional Regulation Skills (Moderate moods, anger management, stress management), Feel and express a full Range of Emotions, Learn about Diagnosis, Healthy Coping Skills.     Interventions:CBT  Summary: Mia Underwood a 67 y.o.femalewho presents for her individual counseling session. Pt discussed her psychiatric symptoms and current life events. Pt presents wnl today. She is adjusting to her new foundation of using boundaries with family members and doing what she can with whom she would like to. Practiced using boundaries with pt. Her current focus is on her granddaughter who lives with her, helping her complete hs and getting accepted to college. Pt has visited her mother at the nursing home. Her mother has dementia and she doesn't know who she is. Processed her feelings about her mother. Asked open ended questions and used empathic reflection. Continue to encourage pt to focus on her self care. Pt has joined [employer].     Suicidal/Homicidal:Nowithout intent/plan  Therapist Response: Assessed pt's current functioning and reviewed progress. Assisted pt processing  communication, boundaries, self care, mother.  Assisted pt processing for management of stressors.  Plan: Return again in 2 weeks Discuss childhood   Diagnosis:Axis I: Major Depression, recurent, chronic      Marcelle Hepner S, LCAS 09/24/18

## 2018-10-10 ENCOUNTER — Ambulatory Visit (HOSPITAL_COMMUNITY): Payer: Self-pay | Admitting: Licensed Clinical Social Worker

## 2018-10-15 ENCOUNTER — Encounter (HOSPITAL_COMMUNITY): Payer: Self-pay | Admitting: Licensed Clinical Social Worker

## 2018-10-15 ENCOUNTER — Ambulatory Visit (HOSPITAL_COMMUNITY): Payer: Medicare HMO | Admitting: Licensed Clinical Social Worker

## 2018-10-15 DIAGNOSIS — F339 Major depressive disorder, recurrent, unspecified: Secondary | ICD-10-CM

## 2018-10-15 NOTE — Progress Notes (Signed)
THERAPIST PROGRESS NOTE  Session Time: 9:10-10:00am  Participation Level:Active  Behavioral Response:CasualAlert/Angry/Depressed  Type of Therapy: Individual Therapy  Treatment Goals addressed: Improve psychiatric symptoms, Controlled Behavior, Moderated Mood, Improve Unhelpful Thought Patterns, Emotional Regulation Skills (Moderate moods, anger management, stress management), Feel and express a full Range of Emotions, Learn about Diagnosis, Healthy Coping Skills.     Interventions:CBT  Summary: Mia Underwood a 67 y.o.femalewho presents for her individual counseling session. Pt discussed her psychiatric symptoms and current life events. Pt presented depressed, angry and frustrated. She and her daughter had a disagreement. Her stressor continues to be disrespect by the granddaughter, non engagement, not following household rules. Educated  pt parenting teens at length. Role played scenarios with teens. Discussed options. It was determined today that pt has medicare and pt must be transferred to a LCSW. Pt was not happy at this discovery. Her upcoming appointments were all canceled. Pt will decide her next step for therapy. She will continue to see Dr. Casimiro Needle for medication management.   Suicidal/Homicidal:Nowithout intent/plan  Therapist Response: Assessed pt's current functioning and reviewed progress. Assisted pt processing  Communication, parenting teens, transfer of services.  Assisted pt processing for management of stressors.  Plan: Return again in 2 weeks   Diagnosis:Axis I: Major Depression, recurent, chronic      Katalyn Matin S, LCAS 10/15/18

## 2018-10-22 ENCOUNTER — Ambulatory Visit (HOSPITAL_COMMUNITY): Payer: Medicare HMO | Admitting: Licensed Clinical Social Worker

## 2018-11-05 ENCOUNTER — Ambulatory Visit (HOSPITAL_COMMUNITY): Payer: Medicare HMO | Admitting: Licensed Clinical Social Worker

## 2018-11-19 ENCOUNTER — Ambulatory Visit (HOSPITAL_COMMUNITY): Payer: Medicare HMO | Admitting: Licensed Clinical Social Worker

## 2018-12-09 ENCOUNTER — Other Ambulatory Visit: Payer: Self-pay

## 2018-12-09 NOTE — Telephone Encounter (Signed)
cetirizine (ZYRTEC) 10 MG tablet, REFILL REQUEST @ [facility] ON [address]

## 2018-12-09 NOTE — Telephone Encounter (Signed)
Pt called / informed Certirizine was refilled 8/15 #90 x3 RF, sent to [facility]. Stated ok- informed to call the pharmacy, stated she will.

## 2018-12-11 ENCOUNTER — Encounter (HOSPITAL_COMMUNITY): Payer: Self-pay | Admitting: Psychiatry

## 2018-12-11 ENCOUNTER — Ambulatory Visit (HOSPITAL_COMMUNITY): Payer: Medicare HMO | Admitting: Psychiatry

## 2018-12-11 VITALS — BP 132/80 | HR 80 | Ht 61.0 in | Wt 137.0 lb

## 2018-12-11 DIAGNOSIS — F324 Major depressive disorder, single episode, in partial remission: Secondary | ICD-10-CM | POA: Diagnosis not present

## 2018-12-11 MED ORDER — SERTRALINE HCL 100 MG PO TABS: ORAL_TABLET | ORAL | 5 refills | Status: DC

## 2018-12-11 NOTE — Progress Notes (Signed)
Psychiatric Initial Adult Assessment   Patient Identification: Mia Underwood MRN:  [id number] Date of Evaluation:  12/11/2018 Referral Source: [facility] Chief Complaint: Clinical depression  This patient is being treated in the setting for chronic major depression.  At this time she is doing very well.  She denies daily depression.  She is sleeping only fairly well.  Her sleep is somewhat erratic.  Her appetite is good her energy level is good.  She denies being suicidal.  Her biggest issue is related to her grandchildren.  She also has a young cousin also is having a lot of behavioral problems as well.  Mainly the issues are around her grandchildren other than that she feels healthy and financially she is stable.  She has a few very close friends that she makes contact with.  She rents an apartment and she likes it.  The patient enjoys reading watching TV.  She has no psychotic symptomatology at all.  She denies the use of alcohol or drugs.  She has no new problem.  She continues in one-to-one therapy.    Past Psychiatric History:   Previous Psychotropic Medications: Yes   Substance Abuse History in the last 12 months:  Yes.    Consequences of Substance Abuse:   Past Medical History:  Past Medical History:  Diagnosis Date  . Allergic rhinitis 05/04/2016  . Bilateral carpal tunnel syndrome 10/18/2010   Left > Right   . Diverticulosis of sigmoid colon 06/07/2010  . Essential hypertension 09/14/2009  . Hyperlipidemia 09/14/2009  . 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 Major depression, recurrent, chronic ([place]) 09/14/2009  . Marijuana abuse 04/04/2016  . Tobacco abuse 01/17/2016  . Vitamin D deficiency 04/04/2016    Past Surgical History:  Procedure Laterality Date  . ABDOMINAL HYSTERECTOMY  April 1996   For fibroids    Family Psychiatric History:   Family History:  Family History  Problem Relation Age of Onset  . Breast cancer Mother   . Depression Mother    . Hypertension Mother   . Dementia Mother   . Alcoholism Father        Died in his 63's related to alcohol abuse  . Leukemia Sister 8  . Early death Brother        Died in a motor vehicle accident  . Heart attack Daughter 103       Per patient report, specifics unknown  . Diabetes Mellitus II Maternal Grandmother   . Diabetes Mellitus II Paternal Grandmother   . Healthy Sister   . Healthy Sister   . Arthritis Brother   . Healthy Brother   . Healthy Brother   . Healthy Son   . Breast cancer Maternal Aunt   . Breast cancer Cousin   . Breast cancer Maternal Aunt     Social History:   Social History   Socioeconomic History  . Marital status: Single    Spouse name: Not on file  . Number of children: 2  . Years of education: 12 grade  . Highest education level: Not on file  Occupational History  . Occupation: Leisure centre manager: [employer]    Comment: Retired  Scientific laboratory technician  . Financial resource strain: Not on file  . Food insecurity:    Worry: Not on file    Inability: Not on file  . Transportation needs:    Medical: Not on file    Non-medical: Not on file  Tobacco Use  .  Smoking status: Current Some Underwood Smoker    Packs/Underwood: 0.20    Types: Cigarettes    Start date: 12/02/1973  . Smokeless tobacco: Never Used  . Tobacco comment: Stopped in 1980 restarted in 1985. 1pack every 8 days.  Stopped on 01/20/2016. Restarted again.  Substance and Sexual Activity  . Alcohol use: Yes    Alcohol/week: 6.0 standard drinks    Types: 6 Cans of beer per week    Comment: occasional weekend use - has cut back 6 pack a week  . Drug use: No  . Sexual activity: Never  Lifestyle  . Physical activity:    Days per week: Not on file    Minutes per session: Not on file  . Stress: Not on file  Relationships  . Social connections:    Talks on phone: Not on file    Gets together: Not on file    Attends religious service: Not on file    Active member of club or organization: Not on  file    Attends meetings of clubs or organizations: Not on file    Relationship status: Not on file  Other Topics Concern  . Not on file  Social History Narrative   Graduated from [employer] in [place].  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    Additional Social History:   Allergies:   Allergies  Allergen Reactions  . Penicillins Hives  . Naproxen Rash    Metabolic Disorder Labs: No results found for: HGBA1C, MPG No results found for: PROLACTIN Lab Results  Component Value Date   CHOL 187 08/03/2016   TRIG 66 08/03/2016   HDL 90 08/03/2016   CHOLHDL 2.1 08/03/2016   VLDL 20 11/01/2010   LDLCALC 84 08/03/2016   LDLCALC 127 (H) 01/03/2016     Current Medications: Current Outpatient Medications  Medication Sig Dispense Refill  . amLODipine (NORVASC) 10 MG tablet Take 1 tablet (10 mg total) by mouth daily. 90 tablet 3  . aspirin EC 81 MG tablet Take 1 tablet (81 mg total) by mouth daily. 90 tablet 3  . cetirizine (ZYRTEC) 10 MG tablet Take 1 tablet (10 mg total) by mouth at bedtime. 90 tablet 3  . cholecalciferol (VITAMIN D) 1000 units tablet Take 1 tablet (1,000 Units total) by mouth daily. 90 tablet 3  . gabapentin (NEURONTIN) 600 MG tablet Take 1 tablet (600 mg total) by mouth 2 (two) times daily. 180 tablet 3  . lisinopril-hydrochlorothiazide (PRINZIDE,ZESTORETIC) 20-25 MG tablet Take 2 tablets by mouth daily. 180 tablet 3  . sertraline (ZOLOFT) 100 MG tablet 1  qam 60 tablet 5  . simvastatin (ZOCOR) 20 MG tablet Take 1 tablet (20 mg total) by mouth daily. 90 tablet 3  . traZODone (DESYREL) 50 MG tablet 2  qhs 60 tablet 5  . calcium carbonate (OS-CAL) 600 MG TABS tablet Take 1 tablet (600 mg total) by mouth 2 (two) times daily with a meal. (Patient not taking: Reported on 09/09/2018) 180 tablet 3   No current facility-administered medications for this visit.     Neurologic: Headache: No Seizure:  No Paresthesias:No  Musculoskeletal: Strength & Muscle Tone: within normal limits Gait & Station: normal Patient leans: N/A  Psychiatric Specialty Exam: ROS  Blood pressure 132/80, pulse 80, height 5\' 1"  (1.549 m), weight 137 lb (62.1 kg).Body mass index is 25.89 kg/m.  General Appearance: Casual  Eye Contact:  Fair  Speech:  Clear and Coherent  Volume:  Normal  Mood:  Depressed  Affect:  Blunt  Thought Process:  Goal Directed  Orientation:  Full (Time, Place, and Person)  Thought Content:  WDL  Suicidal Thoughts:  No  Homicidal Thoughts:  No  Memory:  NA  Judgement:  Good  Insight:  Good  Psychomotor Activity:  Normal  Concentration:    Recall:  [place] of Knowledge:Good  Language: Poor  Akathisia:  No  Handed:  Right  AIMS (if indicated):    Assets:  Desire for Improvement  ADL's:  Intact  Cognition: WNL  Sleep:      Treatment Plan Summary: 1/15/20201:44 PM  Today the patient has 2 chronic problems.  The first 1 is clinical major depression and actually is well controlled at this time.  For this she is confirmed that she takes Zoloft 100 mg every Underwood.  She now is taking it regularly compared to last time when she was erratic about taking it.  Her second part of her treatment is that she sees her therapist Charolotte Eke on a regular basis.  Her second problem is that of insomnia.  The patient takes trazodone..  Today part of her treatment plan for insomnia was to review good sleep hygiene.  She understands this is important part of her treatment for insomnia.  So trazodone continues and together with sleep hygiene sleep should be pretty well resolved.  Overall the patient symptoms are minimal.  Her energy level is good.  She is not fatigued.  Patient is not suicidal.  She is functioning well.  This patient she will return to see me in 4 months for just a 15-minute visit.

## 2019-02-03 ENCOUNTER — Ambulatory Visit (INDEPENDENT_AMBULATORY_CARE_PROVIDER_SITE_OTHER): Payer: 59 | Admitting: Internal Medicine

## 2019-02-03 ENCOUNTER — Other Ambulatory Visit: Payer: Self-pay

## 2019-02-03 DIAGNOSIS — M62838 Other muscle spasm: Secondary | ICD-10-CM

## 2019-02-03 DIAGNOSIS — J069 Acute upper respiratory infection, unspecified: Secondary | ICD-10-CM

## 2019-02-03 DIAGNOSIS — F329 Major depressive disorder, single episode, unspecified: Secondary | ICD-10-CM

## 2019-02-03 DIAGNOSIS — F17200 Nicotine dependence, unspecified, uncomplicated: Secondary | ICD-10-CM | POA: Diagnosis not present

## 2019-02-03 DIAGNOSIS — I10 Essential (primary) hypertension: Secondary | ICD-10-CM

## 2019-02-03 DIAGNOSIS — J309 Allergic rhinitis, unspecified: Secondary | ICD-10-CM | POA: Diagnosis not present

## 2019-02-03 HISTORY — DX: Acute upper respiratory infection, unspecified: J06.9

## 2019-02-03 HISTORY — DX: Other muscle spasm: M62.838

## 2019-02-03 MED ORDER — DICLOFENAC SODIUM 1 % TD GEL: 2.0000 g | Freq: Two times a day (BID) | TRANSDERMAL | 0 refills | Status: AC | PRN

## 2019-02-03 NOTE — Patient Instructions (Signed)
It was a pleasure to see you today Mia Underwood. I am sorry to hear that you are not feeling well.   It appears that you have a viral upper respiratory infection. I recommend you drink plenty of fluids and rest for the next 2-3 days.   For your left upper back pain. It is likely that you have a muscle spasm. I have prescribed you voltaren gel to apply to the back.   If you have any questions or concerns, please call our clinic at [phone] between 9am-5pm and after hours call [phone] and ask for the internal medicine resident on call. If you feel you are having a medical emergency please call 911.   Thank you, we look forward to help you remain healthy!  Lars Mage, MD Internal Medicine PGY2

## 2019-02-03 NOTE — Progress Notes (Signed)
   CC: Upper respiratory symptoms   HPI:  [name] is a 68 y.o. essential htn, mdd, allergic rhinitis, and tobacco abuse who presents for upper respiratory symptoms. Please see problem based charting for evaluation, assessment, and plan.  Past Medical History:  Diagnosis Date  . Allergic rhinitis 05/04/2016  . Bilateral carpal tunnel syndrome 10/18/2010   Left > Right   . Diverticulosis of sigmoid colon 06/07/2010  . Essential hypertension 09/14/2009  . Hyperlipidemia 09/14/2009  . 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 Major depression, recurrent, chronic ([place]) 09/14/2009  . Marijuana abuse 04/04/2016  . Tobacco abuse 01/17/2016  . Vitamin D deficiency 04/04/2016   Review of Systems:    Review of Systems  Constitutional: Positive for chills and fever.  Respiratory: Positive for cough and sputum production. Negative for shortness of breath.   Cardiovascular: Negative for chest pain.  Gastrointestinal: Negative for abdominal pain, nausea and vomiting.  Musculoskeletal: Positive for back pain. Negative for myalgias.  Neurological: Negative for dizziness and headaches.   Physical Exam:  Vitals:   02/03/19 1457  BP: (!) 156/67  Pulse: 66  Temp: 98.5 F (36.9 C)  TempSrc: Oral  SpO2: 97%  Weight: 143 lb (64.9 kg)  Height: 5\' 1"  (1.549 m)   Physical Exam  Constitutional: She appears well-developed and well-nourished. No distress.  HENT:  Head: Normocephalic and atraumatic.  Right Ear: External ear normal.  Left Ear: External ear normal.  No posterior pharyngeal erythema or exudate.  No anterior or posterior cervical lymphadenopathy.  Eyes: Conjunctivae are normal.  Cardiovascular: Normal rate, regular rhythm and normal heart sounds.  Respiratory: Effort normal and breath sounds normal. No respiratory distress. She has no wheezes.  GI: Soft. Bowel sounds are normal. She exhibits no distension. There is no abdominal tenderness.  Neurological: She is  alert.  Skin: She is not diaphoretic.  Psychiatric: She has a normal mood and affect. Her behavior is normal. Judgment and thought content normal.   Assessment & Plan:   See Encounters Tab for problem based charting.  Patient discussed with Dr. Eppie Gibson

## 2019-02-03 NOTE — Assessment & Plan Note (Signed)
The patient states that she has noticed a dull, intermittent pain on inferior aspect of left scapula that started the same day that her upper respiratory symptoms started.  The patient describes the pain as "phlegm under her left shoulder blade".  Assessment and plan The patient has point tenderness to palpation on the inferior aspect of left scapula.  It is likely that the patient has a muscle strain that should be alleviated with muscle relaxant.  -Prescribed Voltaren gel

## 2019-02-03 NOTE — Assessment & Plan Note (Signed)
Patient presented with a productive cough with clear-colored sputum for the past 5 days.  She has accompanied runny nose, sore throat, sneezing, headache, myalgias, and alternating feeling of fevers and chills.  She denies ear pain, shortness of breath, chest pain, sick contacts, recent travel.    The patient received her influenza vaccine in October 2019 and PCV 13 in January 2019.   Assessment and plan It appears that the patient has an viral upper respiratory infection.  She is not in the window for treatment of influenza and therefore was not tested.  Recommended the patient get plenty of rest, hydrate well.

## 2019-02-04 NOTE — Addendum Note (Signed)
Addended by: Oval Linsey D on: 02/04/2019 06:41 PM   Modules accepted: Level of Service

## 2019-02-04 NOTE — Progress Notes (Signed)
Patient ID: Mia Underwood DATE, female   DOB: 1951/05/08, 68 y.o.   MRN: [id number]  I saw and evaluated the patient.  I personally confirmed the key portions of Dr. Thea Gist history and exam and reviewed pertinent patient test results.  The assessment, diagnosis, and plan were formulated together and I agree with the documentation in the resident's note.

## 2019-02-10 ENCOUNTER — Ambulatory Visit (INDEPENDENT_AMBULATORY_CARE_PROVIDER_SITE_OTHER): Payer: Medicare HMO | Admitting: Internal Medicine

## 2019-02-10 ENCOUNTER — Other Ambulatory Visit: Payer: Self-pay

## 2019-02-10 ENCOUNTER — Encounter: Payer: Self-pay | Admitting: Internal Medicine

## 2019-02-10 VITALS — BP 177/65 | HR 57 | Temp 98.4°F | Ht 61.0 in | Wt 144.4 lb

## 2019-02-10 DIAGNOSIS — E2839 Other primary ovarian failure: Secondary | ICD-10-CM

## 2019-02-10 DIAGNOSIS — Z23 Encounter for immunization: Secondary | ICD-10-CM

## 2019-02-10 DIAGNOSIS — Z0000 Encounter for general adult medical examination without abnormal findings: Secondary | ICD-10-CM

## 2019-02-10 NOTE — Progress Notes (Signed)
I discussed the AWV findings with the RN who conducted the visit. I was present in the office suite and immediately available to provide assistance and direction throughout the time the service was provided.   

## 2019-02-10 NOTE — Progress Notes (Signed)
Subjective:   Mia Underwood is a 68 y.o. female who presents for a Medicare Annual Wellness Visit.  The following items have been reviewed and updated today in the appropriate area in the EMR.   Health Risk Assessment  Height, weight, BMI, and BP Visual acuity if needed Depression screen Fall risk / safety level Advance directive discussion Medical and family history were reviewed and updated Updating list of other providers & suppliers Medication reconciliation, including over the counter medicines Cognitive screen Written screening schedule Risk Factor list Personalized health advice, risky behaviors, and treatment advice   Social History   Social History Narrative   Graduated from [employer] in [place].  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      Current Social History 02/10/2019        Patient lives with granddaughter in an apartment on the first floor. There are not steps up to the entrance the patient uses.       Patient's method of transportation is personal car.      The highest level of education was high school diploma.      The patient currently retired.      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  Pulse (!) 57   Temp 98.4 F (36.9 C) (Oral)   Ht 5\' 1"  (1.549 m)   Wt 144 lb 6.4 oz (65.5 kg)   SpO2 99%   BMI 27.28 kg/m  Patient had yet to take AM BP meds. States she is better at remembering to take at night. Encouraged to  take same time everyday and discuss PM dosing with PCP at upcoming appt.  Activities of Daily Living In your present state of health, do you have any difficulty performing the following activities: 02/10/2019 02/03/2019  Hearing? N N  Vision? N Y  Comment - glasses  Difficulty concentrating or making decisions? Y N  Comment Using more sticky notes -  Walking or climbing stairs? N N  Dressing or bathing? N N  Doing errands, shopping? N N  Some recent data might be hidden    Goals Goals    . Blood Pressure < 140/90    . improve relationships with family members (pt-stated)    . Quit smoking / using tobacco       Fall Risk Fall Risk  02/10/2019 02/03/2019 09/09/2018 03/08/2018 11/30/2017  Falls in the past year? 0 0 No No No  Number falls in past yr: - - - - -  Injury with Fall? - - - - -  Risk for fall due to : - - - - -  Follow up Education provided;Falls prevention discussed - - - -  Handout on Fall Prevention: Home Exercise Program, Access codes [email or web address] and [email or web address] given to patient with exercise band.    Depression Screen PHQ 2/9 Scores 02/10/2019  02/03/2019 09/09/2018 03/08/2018  PHQ - 2 Score 1 2 0 1  PHQ- 9 Score 6 3 6  -  Patient has upcoming appts with counselor and psychiatrist.  Cognitive Testing I assessed the patient for cognitive issues and the patient did not have issues with his / her cognition.  Mini-Cog  Passed with score 5/5   Assessment and Plan:    During the course of the visit the patient was educated and counseled about appropriate screening and preventive services as documented in the assessment and plan.  The printed AVS was given to the patient and included an updated screening schedule, a list of risk factors, and personalized health advice.        Velora Heckler, RN  02/10/2019

## 2019-02-10 NOTE — Patient Instructions (Addendum)
Annual Wellness Visit   Medicare Covered Preventative Screenings and Services  Services & Screenings Men and Women Who How Often Need? Date of Last Service Action  Abdominal Aortic Aneurysm Adults with AAA risk factors Once     Alcohol Misuse and Counseling All Adults Screening once a year if no alcohol misuse. Counseling up to 4 face to face sessions.     Bone Density Measurement  Adults at risk for osteoporosis Once every 2 yrs Yes   Referral placed to the [place] Panel Z13.6 All adults without CV disease Once every 5 yrs     Colorectal Cancer   Stool sample or  Colonoscopy All adults 24 and older   Once every year  Every 10 years     Depression All Adults Once a year Yes Today  PHQ-9 = 6  Diabetes Screening Blood glucose, post glucose load, or GTT Z13.1  All adults at risk  Pre-diabetics  Once per year  Twice per year     Diabetes  Self-Management Training All adults Diabetics 10 hrs first year; 2 hours subsequent years. Requires Copay     Glaucoma  Diabetics  Family history of glaucoma  African Americans 30 yrs +  Hispanic Americans 53 yrs + Annually - requires coppay     Hepatitis C Z72.89 or F19.20  High Risk for HCV  Born between 1945 and 1965  Annually  Once     HIV Z11.4 All adults based on risk  Annually btw ages 45 & 17 regardless of risk  Annually > 65 yrs if at increased risk     Lung Cancer Screening Asymptomatic adults aged 10-77 with 30 pack yr history and current smoker OR quit within the last 15 yrs Annually Must have counseling and shared decision making documentation before first screen     Medical Nutrition Therapy Adults with   Diabetes  Renal disease  Kidney transplant within past 3 yrs 3 hours first year; 2 hours subsequent years     Obesity and Counseling All adults Screening once a year Counseling if BMI 30 or higher  Today   Tobacco Use Counseling Adults who use tobacco  Up to 8 visits in one year Yes    1-800-QUIT-NOW  Vaccines Z23  Hepatitis B  Influenza   Pneumonia  Adults   Once  Once every flu season  Two different vaccines separated by one year  Yes     Pneumovax-23  Next Annual Wellness Visit People with Medicare Every year  Today     Services & Screenings Women Who How Often Need  Date of Last Service Action  Mammogram  Z12.31 Women over 51 One baseline ages 50-39. Annually ager 40 yrs+     Pap tests All women Annually if high risk. Every 2 yrs for normal risk women     Screening for cervical cancer with   Pap (Z01.419 nl or Z01.411abnl) &  HPV Z11.51 Women aged 65 to 62 Once every 5 yrs     Screening pelvic and breast exams All women Annually if high risk. Every 2 yrs for normal risk women     Sexually Transmitted Diseases  Chlamydia  Gonorrhea  Syphilis All at risk adults Annually for non pregnant females at increased risk         [place] Men Who How Ofter Need  Date of Last Service Action  Prostate Cancer - DRE & PSA Men over 50 Annually.  DRE might require a copay.  Sexually Transmitted Diseases  Syphilis All at risk adults Annually for men at increased risk         Things That May Be Affecting Your Health:  Alcohol  Hearing loss  Pain   X Depression  Home Safety  Sexual Health   Diabetes  Lack of physical activity X Stress   Difficulty with daily activities  Loneliness  Tiredness   Drug use  Medicines X Tobacco use   Falls  Motor Vehicle Safety  Weight   Food choices  Oral Health  Other    YOUR PERSONALIZED HEALTH PLAN : 1. Schedule your next subsequent Medicare Wellness visit in one year 2. Attend all of your regular appointments to address your medical issues 3. Complete the preventative screenings and services 4. Get Dexa Scan done 5. Keep appts with Counselor and Psychiatrist   Fall Prevention in the Home, Adult Falls can cause injuries. They can happen to people of all ages. There are many things you can do to  make your home safe and to help prevent falls. Ask for help when making these changes, if needed. What actions can I take to prevent falls? General Instructions  Use good lighting in all rooms. Replace any light bulbs that burn out.  Turn on the lights when you go into a dark area. Use night-lights.  Keep items that you use often in easy-to-reach places. Lower the shelves around your home if necessary.  Set up your furniture so you have a clear path. Avoid moving your furniture around.  Do not have throw rugs and other things on the floor that can make you trip.  Avoid walking on wet floors.  If any of your floors are uneven, fix them.  Add color or contrast paint or tape to clearly mark and help you see: ? Any grab bars or handrails. ? First and last steps of stairways. ? Where the edge of each step is.  If you use a stepladder: ? Make sure that it is fully opened. Do not climb a closed stepladder. ? Make sure that both sides of the stepladder are locked into place. ? Ask someone to hold the stepladder for you while you use it.  If there are any pets around you, be aware of where they are. What can I do in the bathroom?      Keep the floor dry. Clean up any water that spills onto the floor as soon as it happens.  Remove soap buildup in the tub or shower regularly.  Use non-skid mats or decals on the floor of the tub or shower.  Attach bath mats securely with double-sided, non-slip rug tape.  If you need to sit down in the shower, use a plastic, non-slip stool.  Install grab bars by the toilet and in the tub and shower. Do not use towel bars as grab bars. What can I do in the bedroom?  Make sure that you have a light by your bed that is easy to reach.  Do not use any sheets or blankets that are too big for your bed. They should not hang down onto the floor.  Have a firm chair that has side arms. You can use this for support while you get dressed. What can I do in  the kitchen?  Clean up any spills right away.  If you need to reach something above you, use a strong step stool that has a grab bar.  Keep electrical cords out of the way.  Do not use floor polish or wax that makes floors slippery. If you must use wax, use non-skid floor wax. What can I do with my stairs?  Do not leave any items on the stairs.  Make sure that you have a light switch at the top of the stairs and the bottom of the stairs. If you do not have them, ask someone to add them for you.  Make sure that there are handrails on both sides of the stairs, and use them. Fix handrails that are broken or loose. Make sure that handrails are as long as the stairways.  Install non-slip stair treads on all stairs in your home.  Avoid having throw rugs at the top or bottom of the stairs. If you do have throw rugs, attach them to the floor with carpet tape.  Choose a carpet that does not hide the edge of the steps on the stairway.  Check any carpeting to make sure that it is firmly attached to the stairs. Fix any carpet that is loose or worn. What can I do on the outside of my home?  Use bright outdoor lighting.  Regularly fix the edges of walkways and driveways and fix any cracks.  Remove anything that might make you trip as you walk through a door, such as a raised step or threshold.  Trim any bushes or trees on the path to your home.  Regularly check to see if handrails are loose or broken. Make sure that both sides of any steps have handrails.  Install guardrails along the edges of any raised decks and porches.  Clear walking paths of anything that might make someone trip, such as tools or rocks.  Have any leaves, snow, or ice cleared regularly.  Use sand or salt on walking paths during winter.  Clean up any spills in your garage right away. This includes grease or oil spills. What other actions can I take?  Wear shoes that: ? Have a low heel. Do not wear high  heels. ? Have rubber bottoms. ? Are comfortable and fit you well. ? Are closed at the toe. Do not wear open-toe sandals.  Use tools that help you move around (mobility aids) if they are needed. These include: ? Canes. ? Walkers. ? Scooters. ? Crutches.  Review your medicines with your doctor. Some medicines can make you feel dizzy. This can increase your chance of falling. Ask your doctor what other things you can do to help prevent falls. Where to find more information  Centers for Disease Control and Prevention, STEADI: https://garcia.biz/  [employer] on Aging: BrainJudge.co.uk Contact a doctor if:  You are afraid of falling at home.  You feel weak, drowsy, or dizzy at home.  You fall at home. Summary  There are many simple things that you can do to make your home safe and to help prevent falls.  Ways to make your home safe include removing tripping hazards and installing grab bars in the bathroom.  Ask for help when making these changes in your home. This information is not intended to replace advice given to you by your health care provider. Make sure you discuss any questions you have with your health care provider. Document Released: 09/09/2009 Document Revised: 06/28/2017 Document Reviewed: 06/28/2017 Elsevier Interactive Patient Education  2019 [place] Maintenance, Female Adopting a healthy lifestyle and getting preventive care can go a long way to promote health and wellness. Talk with your health care provider about what schedule of  regular examinations is right for you. This is a good chance for you to check in with your provider about disease prevention and staying healthy. In between checkups, there are plenty of things you can do on your own. Experts have done a lot of research about which lifestyle changes and preventive measures are most likely to keep you healthy. Ask your health care provider for more information. Weight and  diet Eat a healthy diet  Be sure to include plenty of vegetables, fruits, low-fat dairy products, and lean protein.  Do not eat a lot of foods high in solid fats, added sugars, or salt.  Get regular exercise. This is one of the most important things you can do for your health. ? Most adults should exercise for at least 150 minutes each week. The exercise should increase your heart rate and make you sweat (moderate-intensity exercise). ? Most adults should also do strengthening exercises at least twice a week. This is in addition to the moderate-intensity exercise. Maintain a healthy weight  Body mass index (BMI) is a measurement that can be used to identify possible weight problems. It estimates body fat based on height and weight. Your health care provider can help determine your BMI and help you achieve or maintain a healthy weight.  For females 30 years of age and older: ? A BMI below 18.5 is considered underweight. ? A BMI of 18.5 to 24.9 is normal. ? A BMI of 25 to 29.9 is considered overweight. ? A BMI of 30 and above is considered obese. Watch levels of cholesterol and blood lipids  You should start having your blood tested for lipids and cholesterol at 67 years of age, then have this test every 5 years.  You may need to have your cholesterol levels checked more often if: ? Your lipid or cholesterol levels are high. ? You are older than 67 years of age. ? You are at high risk for heart disease. Cancer screening Lung Cancer  Lung cancer screening is recommended for adults 47-13 years old who are at high risk for lung cancer because of a history of smoking.  A yearly low-dose CT scan of the lungs is recommended for people who: ? Currently smoke. ? Have quit within the past 15 years. ? Have at least a 30-pack-year history of smoking. A pack year is smoking an average of one pack of cigarettes a day for 1 year.  Yearly screening should continue until it has been 15 years since  you quit.  Yearly screening should stop if you develop a health problem that would prevent you from having lung cancer treatment. Breast Cancer  Practice breast self-awareness. This means understanding how your breasts normally appear and feel.  It also means doing regular breast self-exams. Let your health care provider know about any changes, no matter how small.  If you are in your 20s or 30s, you should have a clinical breast exam (CBE) by a health care provider every 1-3 years as part of a regular health exam.  If you are 77 or older, have a CBE every year. Also consider having a breast X-ray (mammogram) every year.  If you have a family history of breast cancer, talk to your health care provider about genetic screening.  If you are at high risk for breast cancer, talk to your health care provider about having an MRI and a mammogram every year.  Breast cancer gene (BRCA) assessment is recommended for women who have family members with BRCA-related  cancers. BRCA-related cancers include: ? Breast. ? Ovarian. ? Tubal. ? Peritoneal cancers.  Results of the assessment will determine the need for genetic counseling and BRCA1 and BRCA2 testing. Cervical Cancer Your health care provider may recommend that you be screened regularly for cancer of the pelvic organs (ovaries, uterus, and vagina). This screening involves a pelvic examination, including checking for microscopic changes to the surface of your cervix (Pap test). You may be encouraged to have this screening done every 3 years, beginning at age 78.  For women ages 56-65, health care providers may recommend pelvic exams and Pap testing every 3 years, or they may recommend the Pap and pelvic exam, combined with testing for human papilloma virus (HPV), every 5 years. Some types of HPV increase your risk of cervical cancer. Testing for HPV may also be done on women of any age with unclear Pap test results.  Other health care providers  may not recommend any screening for nonpregnant women who are considered low risk for pelvic cancer and who do not have symptoms. Ask your health care provider if a screening pelvic exam is right for you.  If you have had past treatment for cervical cancer or a condition that could lead to cancer, you need Pap tests and screening for cancer for at least 20 years after your treatment. If Pap tests have been discontinued, your risk factors (such as having a new sexual partner) need to be reassessed to determine if screening should resume. Some women have medical problems that increase the chance of getting cervical cancer. In these cases, your health care provider may recommend more frequent screening and Pap tests. Colorectal Cancer  This type of cancer can be detected and often prevented.  Routine colorectal cancer screening usually begins at 67 years of age and continues through 67 years of age.  Your health care provider may recommend screening at an earlier age if you have risk factors for colon cancer.  Your health care provider may also recommend using home test kits to check for hidden blood in the stool.  A small camera at the end of a tube can be used to examine your colon directly (sigmoidoscopy or colonoscopy). This is done to check for the earliest forms of colorectal cancer.  Routine screening usually begins at age 31.  Direct examination of the colon should be repeated every 5-10 years through 67 years of age. However, you may need to be screened more often if early forms of precancerous polyps or small growths are found. Skin Cancer  Check your skin from head to toe regularly.  Tell your health care provider about any new moles or changes in moles, especially if there is a change in a mole's shape or color.  Also tell your health care provider if you have a mole that is larger than the size of a pencil eraser.  Always use sunscreen. Apply sunscreen liberally and repeatedly  throughout the day.  Protect yourself by wearing long sleeves, pants, a wide-brimmed hat, and sunglasses whenever you are outside. Heart disease, diabetes, and high blood pressure  High blood pressure causes heart disease and increases the risk of stroke. High blood pressure is more likely to develop in: ? People who have blood pressure in the high end of the normal range (130-139/85-89 mm Hg). ? People who are overweight or obese. ? People who are African American.  If you are 88-78 years of age, have your blood pressure checked every 3-5 years. If you are  38 years of age or older, have your blood pressure checked every year. You should have your blood pressure measured twice-once when you are at a hospital or clinic, and once when you are not at a hospital or clinic. Record the average of the two measurements. To check your blood pressure when you are not at a hospital or clinic, you can use: ? An automated blood pressure machine at a pharmacy. ? A home blood pressure monitor.  If you are between 23 years and 65 years old, ask your health care provider if you should take aspirin to prevent strokes.  Have regular diabetes screenings. This involves taking a blood sample to check your fasting blood sugar level. ? If you are at a normal weight and have a low risk for diabetes, have this test once every three years after 67 years of age. ? If you are overweight and have a high risk for diabetes, consider being tested at a younger age or more often. Preventing infection Hepatitis B  If you have a higher risk for hepatitis B, you should be screened for this virus. You are considered at high risk for hepatitis B if: ? You were born in a country where hepatitis B is common. Ask your health care provider which countries are considered high risk. ? Your parents were born in a high-risk country, and you have not been immunized against hepatitis B (hepatitis B vaccine). ? You have HIV or AIDS. ? You  use needles to inject street drugs. ? You live with someone who has hepatitis B. ? You have had sex with someone who has hepatitis B. ? You get hemodialysis treatment. ? You take certain medicines for conditions, including cancer, organ transplantation, and autoimmune conditions. Hepatitis C  Blood testing is recommended for: ? Everyone born from [birth year] through 1965. ? Anyone with known risk factors for hepatitis C. Sexually transmitted infections (STIs)  You should be screened for sexually transmitted infections (STIs) including gonorrhea and chlamydia if: ? You are sexually active and are younger than 67 years of age. ? You are older than 67 years of age and your health care provider tells you that you are at risk for this type of infection. ? Your sexual activity has changed since you were last screened and you are at an increased risk for chlamydia or gonorrhea. Ask your health care provider if you are at risk.  If you do not have HIV, but are at risk, it may be recommended that you take a prescription medicine daily to prevent HIV infection. This is called pre-exposure prophylaxis (PrEP). You are considered at risk if: ? You are sexually active and do not regularly use condoms or know the HIV status of your partner(s). ? You take drugs by injection. ? You are sexually active with a partner who has HIV. Talk with your health care provider about whether you are at high risk of being infected with HIV. If you choose to begin PrEP, you should first be tested for HIV. You should then be tested every 3 months for as long as you are taking PrEP. Pregnancy  If you are premenopausal and you may become pregnant, ask your health care provider about preconception counseling.  If you may become pregnant, take 400 to 800 micrograms (mcg) of folic acid every day.  If you want to prevent pregnancy, talk to your health care provider about birth control (contraception). Osteoporosis and  menopause  Osteoporosis is a disease in which the bones  lose minerals and strength with aging. This can result in serious bone fractures. Your risk for osteoporosis can be identified using a bone density scan.  If you are 28 years of age or older, or if you are at risk for osteoporosis and fractures, ask your health care provider if you should be screened.  Ask your health care provider whether you should take a calcium or vitamin D supplement to lower your risk for osteoporosis.  Menopause may have certain physical symptoms and risks.  Hormone replacement therapy may reduce some of these symptoms and risks. Talk to your health care provider about whether hormone replacement therapy is right for you. Follow these instructions at home:  Schedule regular health, dental, and eye exams.  Stay current with your immunizations.  Do not use any tobacco products including cigarettes, chewing tobacco, or electronic cigarettes.  If you are pregnant, do not drink alcohol.  If you are breastfeeding, limit how much and how often you drink alcohol.  Limit alcohol intake to no more than 1 drink per day for nonpregnant women. One drink equals 12 ounces of beer, 5 ounces of wine, or 1 ounces of hard liquor.  Do not use street drugs.  Do not share needles.  Ask your health care provider for help if you need support or information about quitting drugs.  Tell your health care provider if you often feel depressed.  Tell your health care provider if you have ever been abused or do not feel safe at home. This information is not intended to replace advice given to you by your health care provider. Make sure you discuss any questions you have with your health care provider. Document Released: 05/29/2011 Document Revised: 04/20/2016 Document Reviewed: 08/17/2015 Elsevier Interactive Patient Education  2019 [employer].   Steps to Quit Smoking  Smoking tobacco can be harmful to your health and can  affect almost every organ in your body. Smoking puts you, and those around you, at risk for developing many serious chronic diseases. Quitting smoking is difficult, but it is one of the best things that you can do for your health. It is never too late to quit. What are the benefits of quitting smoking? When you quit smoking, you lower your risk of developing serious diseases and conditions, such as:  Lung cancer or lung disease, such as COPD.  Heart disease.  Stroke.  Heart attack.  Infertility.  Osteoporosis and bone fractures. Additionally, symptoms such as coughing, wheezing, and shortness of breath may get better when you quit. You may also find that you get sick less often because your body is stronger at fighting off colds and infections. If you are pregnant, quitting smoking can help to reduce your chances of having a baby of low birth weight. How do I get ready to quit? When you decide to quit smoking, create a plan to make sure that you are successful. Before you quit:  Pick a date to quit. Set a date within the next two weeks to give you time to prepare.  Write down the reasons why you are quitting. Keep this list in places where you will see it often, such as on your bathroom mirror or in your car or wallet.  Identify the people, places, things, and activities that make you want to smoke (triggers) and avoid them. Make sure to take these actions: ? Throw away all cigarettes at home, at work, and in your car. ? Throw away smoking accessories, such as Scientist, research (medical). ?  Clean your car and make sure to empty the ashtray. ? Clean your home, including curtains and carpets.  Tell your family, friends, and coworkers that you are quitting. Support from your loved ones can make quitting easier.  Talk with your health care provider about your options for quitting smoking.  Find out what treatment options are covered by your health insurance. What strategies can I use to quit  smoking? Talk with your healthcare provider about different strategies to quit smoking. Some strategies include:  Quitting smoking altogether instead of gradually lessening how much you smoke over a period of time. Research shows that quitting "cold [place]" is more successful than gradually quitting.  Attending in-person counseling to help you build problem-solving skills. You are more likely to have success in quitting if you attend several counseling sessions. Even short sessions of 10 minutes can be effective.  Finding resources and support systems that can help you to quit smoking and remain smoke-free after you quit. These resources are most helpful when you use them often. They can include: ? Online chats with a Social worker. ? Telephone quitlines. ? Careers information officer. ? Support groups or group counseling. ? Text messaging programs. ? Mobile phone applications.  Taking medicines to help you quit smoking. (If you are pregnant or breastfeeding, talk with your health care provider first.) Some medicines contain nicotine and some do not. Both types of medicines help with cravings, but the medicines that include nicotine help to relieve withdrawal symptoms. Your health care provider may recommend: ? Nicotine patches, gum, or lozenges. ? Nicotine inhalers or sprays. ? Non-nicotine medicine that is taken by mouth. Talk with your health care provider about combining strategies, such as taking medicines while you are also receiving in-person counseling. Using these two strategies together makes you more likely to succeed in quitting than if you used either strategy on its own. If you are pregnant or breastfeeding, talk with your health care provider about finding counseling or other support strategies to quit smoking. Do not take medicine to help you quit smoking unless told to do so by your health care provider. What things can I do to make it easier to quit? Quitting smoking might feel  overwhelming at first, but there is a lot that you can do to make it easier. Take these important actions:  Reach out to your family and friends and ask that they support and encourage you during this time. Call telephone quitlines, reach out to support groups, or work with a counselor for support.  Ask people who smoke to avoid smoking around you.  Avoid places that trigger you to smoke, such as bars, parties, or smoke-break areas at work.  Spend time around people who do not smoke.  Lessen stress in your life, because stress can be a smoking trigger for some people. To lessen stress, try: ? Exercising regularly. ? Deep-breathing exercises. ? Yoga. ? Meditating. ? Performing a body scan. This involves closing your eyes, scanning your body from head to toe, and noticing which parts of your body are particularly tense. Purposefully relax the muscles in those areas.  Download or purchase mobile phone or tablet apps (applications) that can help you stick to your quit plan by providing reminders, tips, and encouragement. There are many free apps, such as QuitGuide from the [employer] Office manager for Disease Control and Prevention). You can find other support for quitting smoking (smoking cessation) through smokefree.gov and other websites. How will I feel when I quit smoking? Within the  first 24 hours of quitting smoking, you may start to feel some withdrawal symptoms. These symptoms are usually most noticeable 2-3 days after quitting, but they usually do not last beyond 2-3 weeks. Changes or symptoms that you might experience include:  Mood swings.  Restlessness, anxiety, or irritation.  Difficulty concentrating.  Dizziness.  Strong cravings for sugary foods in addition to nicotine.  Mild weight gain.  Constipation.  Nausea.  Coughing or a sore throat.  Changes in how your medicines work in your body.  A depressed mood.  Difficulty sleeping (insomnia). After the first 2-3 weeks of  quitting, you may start to notice more positive results, such as:  Improved sense of smell and taste.  Decreased coughing and sore throat.  Slower heart rate.  Lower blood pressure.  Clearer skin.  The ability to breathe more easily.  Fewer sick days. Quitting smoking is very challenging for most people. Do not get discouraged if you are not successful the first time. Some people need to make many attempts to quit before they achieve long-term success. Do your best to stick to your quit plan, and talk with your health care provider if you have any questions or concerns. This information is not intended to replace advice given to you by your health care provider. Make sure you discuss any questions you have with your health care provider. Document Released: 11/07/2001 Document Revised: 06/19/2017 Document Reviewed: 03/30/2015 Elsevier Interactive Patient Education  2019 [employer].

## 2019-02-17 ENCOUNTER — Other Ambulatory Visit: Payer: Self-pay | Admitting: *Deleted

## 2019-02-17 ENCOUNTER — Telehealth: Payer: Self-pay

## 2019-02-17 DIAGNOSIS — J302 Other seasonal allergic rhinitis: Secondary | ICD-10-CM

## 2019-02-17 DIAGNOSIS — E559 Vitamin D deficiency, unspecified: Secondary | ICD-10-CM

## 2019-02-17 NOTE — Telephone Encounter (Signed)
Requesting to speak with a nurse about meds. Please call pt back.  

## 2019-02-18 NOTE — Telephone Encounter (Signed)
Sent in new encounter 

## 2019-02-19 MED ORDER — VITAMIN D (CHOLECALCIFEROL) 25 MCG (1000 UT) PO TABS: 25.0000 ug/d | ORAL_TABLET | Freq: Every morning | ORAL | 3 refills | Status: DC

## 2019-02-19 MED ORDER — CETIRIZINE HCL 10 MG PO TABS: 10.0000 mg | ORAL_TABLET | Freq: Every day | ORAL | 3 refills | Status: DC

## 2019-02-19 MED ORDER — CALCIUM CARBONATE 600 MG PO TABS: 600.0000 mg | ORAL_TABLET | Freq: Two times a day (BID) | ORAL | 3 refills | Status: DC

## 2019-02-24 ENCOUNTER — Encounter: Payer: Self-pay | Admitting: *Deleted

## 2019-02-28 ENCOUNTER — Encounter: Payer: Self-pay | Admitting: Internal Medicine

## 2019-04-16 ENCOUNTER — Ambulatory Visit (HOSPITAL_COMMUNITY): Payer: Medicare HMO | Admitting: Psychiatry

## 2019-04-16 ENCOUNTER — Other Ambulatory Visit: Payer: Self-pay

## 2019-05-04 ENCOUNTER — Emergency Department (HOSPITAL_COMMUNITY)
Admission: EM | Admit: 2019-05-04 | Discharge: 2019-05-04 | Disposition: A | Discharge status: Home / Self Care | Payer: Medicare HMO | Attending: Emergency Medicine | Admitting: Emergency Medicine

## 2019-05-04 ENCOUNTER — Encounter (HOSPITAL_COMMUNITY): Payer: Self-pay | Admitting: Emergency Medicine

## 2019-05-04 ENCOUNTER — Emergency Department (HOSPITAL_COMMUNITY): Payer: Medicare HMO

## 2019-05-04 ENCOUNTER — Other Ambulatory Visit: Payer: Self-pay

## 2019-05-04 DIAGNOSIS — S6992XA Unspecified injury of left wrist, hand and finger(s), initial encounter: Secondary | ICD-10-CM | POA: Diagnosis present

## 2019-05-04 DIAGNOSIS — I10 Essential (primary) hypertension: Secondary | ICD-10-CM | POA: Diagnosis not present

## 2019-05-04 DIAGNOSIS — S52615A Nondisplaced fracture of left ulna styloid process, initial encounter for closed fracture: Secondary | ICD-10-CM | POA: Insufficient documentation

## 2019-05-04 DIAGNOSIS — W010XXA Fall on same level from slipping, tripping and stumbling without subsequent striking against object, initial encounter: Secondary | ICD-10-CM | POA: Diagnosis not present

## 2019-05-04 DIAGNOSIS — F17210 Nicotine dependence, cigarettes, uncomplicated: Secondary | ICD-10-CM | POA: Insufficient documentation

## 2019-05-04 DIAGNOSIS — Z880 Allergy status to penicillin: Secondary | ICD-10-CM | POA: Insufficient documentation

## 2019-05-04 DIAGNOSIS — Z7982 Long term (current) use of aspirin: Secondary | ICD-10-CM | POA: Insufficient documentation

## 2019-05-04 DIAGNOSIS — Y999 Unspecified external cause status: Secondary | ICD-10-CM | POA: Diagnosis not present

## 2019-05-04 DIAGNOSIS — Y92007 Garden or yard of unspecified non-institutional (private) residence as the place of occurrence of the external cause: Secondary | ICD-10-CM | POA: Diagnosis not present

## 2019-05-04 DIAGNOSIS — Z79899 Other long term (current) drug therapy: Secondary | ICD-10-CM | POA: Insufficient documentation

## 2019-05-04 DIAGNOSIS — Y9301 Activity, walking, marching and hiking: Secondary | ICD-10-CM | POA: Diagnosis not present

## 2019-05-04 DIAGNOSIS — S52502A Unspecified fracture of the lower end of left radius, initial encounter for closed fracture: Secondary | ICD-10-CM | POA: Diagnosis not present

## 2019-05-04 DIAGNOSIS — S52532A Colles' fracture of left radius, initial encounter for closed fracture: Secondary | ICD-10-CM

## 2019-05-04 MED ORDER — ACETAMINOPHEN 325 MG PO TABS: 650.0000 mg | ORAL_TABLET | Freq: Four times a day (QID) | ORAL | 0 refills | Status: DC | PRN

## 2019-05-04 NOTE — ED Provider Notes (Signed)
[facility] EMERGENCY DEPARTMENT Provider Note   CSN: [id number] Arrival date & time: 05/04/19  3235    History   Chief Complaint Chief Complaint  Patient presents with  . Arm Pain  . Hand Pain    HPI Mia QUIN is a 68 y.o. female.     HPI  Patient is a 68 year old female past medical history of hypertension, vitamin D deficiency, hyperlipidemia, diverticulosis, major depressive disorder presenting for left wrist injury.  Patient reports that 2 days ago she was out in her garden and lost her footing on the sidewalk.  She reports that she placed her left hand behind her in an outstretched fashion.  Patient reports that she thought she heard a "pop" but was able to move her wrist afterwards and still have functionality of her hand.  She reports that over the past 48 hours she has had increasing swelling of the wrist and hand.  She reports that it is become more painful to move.  She denies any weakness or numbness of the fingers.  Denies taking any home remedies for symptoms.  Patient does not take blood thinners.  Past Medical History:  Diagnosis Date  . Allergic rhinitis 05/04/2016  . Bilateral carpal tunnel syndrome 10/18/2010   Left > Right   . Diverticulosis of sigmoid colon 06/07/2010  . Essential hypertension 09/14/2009  . Hyperlipidemia 09/14/2009  . 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 Major depression, recurrent, chronic ([place]) 09/14/2009  . Marijuana abuse 04/04/2016  . Tobacco abuse 01/17/2016  . Vitamin D deficiency 04/04/2016    Patient Active Problem List   Diagnosis Date Noted  . Muscle spasm of left shoulder 02/03/2019  . Upper respiratory infection, viral 02/03/2019  . Bilateral leg cramps 09/09/2018  . Allergic rhinitis 05/04/2016  . 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 Marijuana abuse 04/04/2016  . Vitamin D deficiency 04/04/2016  . Tobacco abuse 01/17/2016  . Healthcare maintenance 01/03/2016   . Bilateral carpal tunnel syndrome 10/18/2010  . Diverticulosis of sigmoid colon 06/07/2010  . Hyperlipidemia 09/14/2009  . Major depression, recurrent, chronic ([place]) 09/14/2009  . Essential hypertension 09/14/2009    Past Surgical History:  Procedure Laterality Date  . ABDOMINAL HYSTERECTOMY  April 1996   For fibroids     OB History   No obstetric history on file.      Home Medications    Prior to Admission medications   Medication Sig Start Date End Date Taking? Authorizing Provider  amLODipine (NORVASC) 10 MG tablet Take 1 tablet (10 mg total) by mouth daily. 06/27/18   Oval Linsey, MD  aspirin EC 81 MG tablet Take 1 tablet (81 mg total) by mouth daily. Patient not taking: Reported on 02/10/2019 09/09/18   Oval Linsey, MD  calcium carbonate (OS-CAL) 600 MG TABS tablet Take 1 tablet (600 mg total) by mouth 2 (two) times daily with a meal. 02/19/19   Oval Linsey, MD  cetirizine (ZYRTEC) 10 MG tablet Take 1 tablet (10 mg total) by mouth at bedtime. 02/19/19   Oval Linsey, MD  gabapentin (NEURONTIN) 600 MG tablet Take 1 tablet (600 mg total) by mouth 2 (two) times daily. 09/09/18   Oval Linsey, MD  lisinopril-hydrochlorothiazide (PRINZIDE,ZESTORETIC) 20-25 MG tablet Take 2 tablets by mouth daily. 06/27/18   Oval Linsey, MD  sertraline (ZOLOFT) 100 MG tablet 1  qam 12/11/18   Plovsky, Berneta Sages, MD  simvastatin (ZOCOR) 20 MG tablet Take 1 tablet (20 mg total) by mouth  daily. 06/27/18   Oval Linsey, MD  traZODone (DESYREL) 50 MG tablet 2  qhs 07/12/18   Plovsky, Berneta Sages, MD  Vitamin D, Cholecalciferol, 25 MCG (1000 UT) TABS Take 25 mcg/day by mouth every morning. 02/19/19   Oval Linsey, MD    Family History Family History  Problem Relation Age of Onset  . Breast cancer Mother   . Depression Mother   . Hypertension Mother   . Dementia Mother   . Cancer Mother        Breast  . Alcoholism Father        Died in his 40's related to alcohol abuse  . Leukemia  Sister 8  . Cancer Sister        leukemia  . Early death Brother        Died in a motor vehicle accident  . Heart attack Daughter 87       Per patient report, specifics unknown  . Diabetes Mellitus II Maternal Grandmother   . Diabetes Mellitus II Paternal Grandmother   . Healthy Sister   . Healthy Sister   . Arthritis Brother   . Healthy Brother   . Healthy Brother   . Healthy Son   . Breast cancer Maternal Aunt   . Breast cancer Cousin   . Breast cancer Maternal Aunt     Social History Social History   Tobacco Use  . Smoking status: Current Some Day Smoker    Packs/day: 0.20    Types: Cigarettes    Start date: 12/02/1973  . Smokeless tobacco: Never Used  . Tobacco comment: Stopped in 1980 restarted in 1985. 1pack every 8 days.  Stopped on 01/20/2016. Restarted again. Now 1 pack per week  Substance Use Topics  . Alcohol use: Yes    Comment: 0-3 drinks per week (beer or wine)  . Drug use: No     Allergies   Penicillins and Naproxen   Review of Systems Review of Systems  Musculoskeletal: Positive for arthralgias and myalgias.  Skin: Negative for wound.  Neurological: Negative for weakness and numbness.     Physical Exam Updated Vital Signs BP 108/70 (BP Location: Right Arm)   Pulse 88   Temp 99.1 F (37.3 C) (Oral)   Resp 17   Ht 5\' 1"  (1.549 m)   Wt 61.7 kg   SpO2 98%   BMI 25.70 kg/m   Physical Exam Vitals signs and nursing note reviewed.  Constitutional:      General: She is not in acute distress.    Appearance: She is well-developed. She is not diaphoretic.     Comments: Sitting comfortably in bed.  HENT:     Head: Normocephalic and atraumatic.  Eyes:     General:        Right eye: No discharge.        Left eye: No discharge.     Conjunctiva/sclera: Conjunctivae normal.     Comments: EOMs normal to gross examination.  Neck:     Musculoskeletal: Normal range of motion.  Cardiovascular:     Rate and Rhythm: Normal rate and regular rhythm.      Comments: Intact, 2+ left radial and ulnar pulse. Abdominal:     General: There is no distension.  Musculoskeletal:        General: Swelling and tenderness present.     Comments: Left Hand/Wrist Exam:  Inspection: Patient has swelling to the distal radius circumferentially.  She also has some mild swelling over the dorsum of the  hand.  No erythema.  No crepitus or step-off. Palpation: Patient has point tenderness overlying the distal radius and ulnar styloid.  ROM: Passive/active ROM intact at wrist but decreased due to pain, MCP, PIP, and DIP joints, thumb MCP and IP joints, and no rotational deformity of metacarpals noted. Ligamentous stability: No laxity to valgus/varus stress of MCP, PIP, or DIP joints. No joint laxity with radial stress of thumb. Flexor/Extensor tendons: FDS/FDP tendons intact in digits 2-5 at PIP/DIP joints, respectively; extensor tendons intact in all digits Nerve testing:  Sensation intact to the distal fingers of entire left hand.  Vascular: 2+ radial and ulnar pulses. Capillary refill <2 seconds b/l.   Skin:    General: Skin is warm and dry.  Neurological:     Mental Status: She is alert.     Comments: Cranial nerves intact to gross observation. Patient moves extremities without difficulty.  Psychiatric:        Behavior: Behavior normal.        Thought Content: Thought content normal.        Judgment: Judgment normal.      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 Electronically Signed   By: Lowella Grip III M.D.   On: 05/04/2019 11:09   Result Date: 05/04/2019 CLINICAL DATA:  Pain following fall EXAM: LEFT WRIST - COMPLETE 3+ VIEW COMPARISON:  None.  FINDINGS: Frontal, oblique, lateral, and ulnar deviation scaphoid images were obtained. There is an impacted transversely oriented fracture of the distal radial metaphysis with alignment anatomic. There is avulsion along the base of the ulnar styloid with mild displacement in this area. No other fractures are evident. No dislocation. There is osteoarthritic change in the scaphotrapezial joint. No erosive changes. IMPRESSION: 1. Impacted transversely oriented fracture distal radial metaphysis with alignment essentially anatomic. 2.  Avulsion along the base of the ulnar styloid. 3.  Osteoarthritic change scaphotrapezial joint. Electronically Signed: By: Lowella Grip III M.D. On: 05/04/2019 11:06   Dg Hand Complete Left  Result Date: 05/04/2019 CLINICAL DATA:  Pain following fall EXAM: LEFT HAND - COMPLETE 3+ VIEW COMPARISON:  Left wrist radiographs May 04, 2019 FINDINGS: Frontal, oblique, and lateral views were obtained. There is an impacted transversely oriented fracture of the distal radial metaphysis. There is avulsion along the base of the ulnar styloid with mild displacement of the ulnar styloid with respect to the remainder of the ulna. There is generalized soft tissue swelling over the metacarpals. No metacarpal fracture evident. No fracture elsewhere. There is osteoarthritic change in the scaphotrapezial joint. Other joint spaces appear unremarkable. There is fusion of the lunate and triquetrum bones, an anatomic variant. IMPRESSION: Soft tissue swelling dorsally. Transverse fracture of the distal radial metaphysis with impaction. Avulsion along the base of the ulnar styloid. No other fractures. There is moderate narrowing of the scaphotrapezial joint. 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 Electronically Signed   By: Lowella Grip III M.D.   On: 05/04/2019 11:08    Procedures .Splint Application Date/Time: 12/05/6220 12:01 PM Performed by: Albesa Seen, PA-C Authorized by: Albesa Seen, PA-C   Consent:    Consent obtained:  Verbal   Consent  given by:  Patient   Risks discussed:  Numbness, pain, swelling and discoloration Pre-procedure details:    Sensation:  Normal Procedure details:    Laterality:  Left   Location:  Arm   Arm:  L lower arm   Strapping: yes     Splint type:  Sugar tong   Supplies:  Ortho-Glass and cotton padding Post-procedure details:    Pain:  Improved   Sensation:  Normal   Patient tolerance of procedure:  Tolerated well, no immediate complications   (including critical care time)  Medications Ordered in ED Medications - No data to display   Initial Impression / Assessment and Plan / ED Course  I have reviewed the triage vital signs and the nursing notes.  Pertinent labs & imaging results that were available during my care of the patient were reviewed by me and considered in my medical decision making (see chart for details).  Clinical Course as of May 03 1200  Sun May 04, 2019  1139 Case discussed with attending physician Dr. Tomi Bamberger.   [AM]  1140 Patient is declining pain medication at this time.    [AM]    Clinical Course User Index [AM] Albesa Seen, PA-C       This is a well-appearing 68 year old female, neurovascularly intact presenting for left wrist injury.  Radiograph, reviewed by me demonstrates an anatomically aligned distal radius fracture as well as avulsion fracture of the ulnar styloid.  Will place patient in splint.  Will have patient follow-up with hand.  Patient tolerated splint application well, performed by me.  Patient declined narcotic pain medicine and prefers to use Tylenol.  She is given a return precautions for any increasing pain, swelling, paresthesias or pallor of the left upper extremity.  Patient is in understanding and agrees with plan of care.  This is a supervised visit with Dr. Dorie Rank. Evaluation, management, and discharge planning discussed  with this attending physician.  Final Clinical Impressions(s) / ED Diagnoses   Final diagnoses:  Closed Colles' fracture of left radius, initial encounter  Closed nondisplaced fracture of styloid process of left ulna, initial encounter    ED Discharge Orders         Ordered    acetaminophen (TYLENOL) 325 MG tablet  Every 6 hours PRN     05/04/19 1204           Albesa Seen, PA-C 05/04/19 1206    Dorie Rank, MD 05/06/19 1531

## 2019-05-04 NOTE — ED Triage Notes (Signed)
Pt. Stated, I tripped over my sandals on Friday and hurt my left arm and hand .

## 2019-05-04 NOTE — ED Notes (Signed)
Discharge instructions and prescription discussed with Pt. Pt verbalized understanding. Pt stable and ambulatory.    

## 2019-05-04 NOTE — ED Notes (Signed)
Patient transported to X-ray 

## 2019-05-04 NOTE — ED Notes (Signed)
Ortho tech paged  

## 2019-05-04 NOTE — Discharge Instructions (Addendum)
Please see the information and instructions below regarding your visit.  Your diagnoses today include:  1. Closed Colles' fracture of left radius, initial encounter   2. Closed nondisplaced fracture of styloid process of left ulna, initial encounter      Tests performed today include: See side panel of your discharge paperwork for testing performed today. Vital signs are listed at the bottom of these instructions.   Your x-ray shows 2 well aligned fractures in your wrist.  Medications prescribed:    Take any prescribed medications only as prescribed, and any over the counter medications only as directed on the packaging.  Please take Tylenol, 650 mg every 6 hours as needed for pain. Do not exceed 4000 mg in one day.   Home care instructions:  Please follow any educational materials contained in this packet.   Please apply the ice over top of the Ace wrap for 20 minutes on, up to 4 times a day.  Should you start to have some discomfort in your fingers, you may loosen the wrapping around the splint.  Follow-up instructions: Please follow-up with Dr. Percell Miller of orthopedics by calling his office tomorrow morning.   Return instructions:  Please return to the Emergency Department if you experience worsening symptoms.  Please return to the emergency department he develop any increasing pain, swelling, discoloration of your fingers or loss of sensation. Please return if you have any other emergent concerns.  Additional Information:   Your vital signs today were: BP 108/70 (BP Location: Right Arm)    Pulse 88    Temp 99.1 F (37.3 C) (Oral)    Resp 17    Ht 5\' 1"  (1.549 m)    Wt 61.7 kg    SpO2 98%    BMI 25.70 kg/m  If your blood pressure (BP) was elevated on multiple readings during this visit above 130 for the top number or above 80 for the bottom number, please have this repeated by your primary care provider within one month. --------------  Thank you for allowing [place] to  participate in your care today.

## 2019-05-04 NOTE — Progress Notes (Signed)
Orthopedic Tech Progress Note Patient Details:  Mia Underwood December 30, 1950 [id number] Had help from PA with applying the splint Ortho Devices Type of Ortho Device: Arm sling, Sugartong splint Ortho Device/Splint Location: ULE Ortho Device/Splint Interventions: Adjustment, Application, Ordered   Post Interventions Patient Tolerated: Well Instructions Provided: Care of device, Adjustment of device   Janit Pagan 05/04/2019, 12:01 PM

## 2019-05-07 DIAGNOSIS — M25532 Pain in left wrist: Secondary | ICD-10-CM | POA: Diagnosis not present

## 2019-05-22 ENCOUNTER — Other Ambulatory Visit: Payer: Self-pay | Admitting: Internal Medicine

## 2019-05-22 DIAGNOSIS — Z1231 Encounter for screening mammogram for malignant neoplasm of breast: Secondary | ICD-10-CM

## 2019-05-28 DIAGNOSIS — M25532 Pain in left wrist: Secondary | ICD-10-CM | POA: Diagnosis not present

## 2019-06-02 ENCOUNTER — Other Ambulatory Visit: Payer: Self-pay | Admitting: *Deleted

## 2019-06-02 DIAGNOSIS — I10 Essential (primary) hypertension: Secondary | ICD-10-CM

## 2019-06-02 MED ORDER — AMLODIPINE BESYLATE 10 MG PO TABS: 10.0000 mg | ORAL_TABLET | Freq: Every day | ORAL | 3 refills | Status: DC

## 2019-06-09 ENCOUNTER — Other Ambulatory Visit: Payer: Self-pay | Admitting: *Deleted

## 2019-06-09 DIAGNOSIS — I10 Essential (primary) hypertension: Secondary | ICD-10-CM

## 2019-06-11 DIAGNOSIS — M25532 Pain in left wrist: Secondary | ICD-10-CM | POA: Diagnosis not present

## 2019-06-11 MED ORDER — AMLODIPINE BESYLATE 10 MG PO TABS: 10.0000 mg | ORAL_TABLET | Freq: Every day | ORAL | 3 refills | Status: DC

## 2019-06-19 ENCOUNTER — Other Ambulatory Visit: Payer: Self-pay | Admitting: *Deleted

## 2019-06-19 DIAGNOSIS — I10 Essential (primary) hypertension: Secondary | ICD-10-CM

## 2019-06-19 MED ORDER — LISINOPRIL-HYDROCHLOROTHIAZIDE 20-25 MG PO TABS: 2.0000 | ORAL_TABLET | Freq: Every day | ORAL | 3 refills | Status: DC

## 2019-06-19 NOTE — Telephone Encounter (Signed)
Next appt scheduled 7/28 with PCP. 

## 2019-06-23 ENCOUNTER — Other Ambulatory Visit: Payer: Self-pay

## 2019-06-23 ENCOUNTER — Encounter: Payer: Self-pay | Admitting: Internal Medicine

## 2019-06-24 ENCOUNTER — Encounter: Payer: Medicare HMO | Admitting: Internal Medicine

## 2019-07-01 ENCOUNTER — Ambulatory Visit (INDEPENDENT_AMBULATORY_CARE_PROVIDER_SITE_OTHER): Payer: Medicare HMO | Admitting: Internal Medicine

## 2019-07-01 ENCOUNTER — Encounter (INDEPENDENT_AMBULATORY_CARE_PROVIDER_SITE_OTHER): Payer: Self-pay

## 2019-07-01 ENCOUNTER — Other Ambulatory Visit: Payer: Self-pay

## 2019-07-01 DIAGNOSIS — I10 Essential (primary) hypertension: Secondary | ICD-10-CM | POA: Diagnosis not present

## 2019-07-01 DIAGNOSIS — R252 Cramp and spasm: Secondary | ICD-10-CM | POA: Diagnosis not present

## 2019-07-01 DIAGNOSIS — S52532D Colles' fracture of left radius, subsequent encounter for closed fracture with routine healing: Secondary | ICD-10-CM | POA: Diagnosis not present

## 2019-07-01 DIAGNOSIS — E559 Vitamin D deficiency, unspecified: Secondary | ICD-10-CM

## 2019-07-01 DIAGNOSIS — G4700 Insomnia, unspecified: Secondary | ICD-10-CM | POA: Diagnosis not present

## 2019-07-01 DIAGNOSIS — F418 Other specified anxiety disorders: Secondary | ICD-10-CM

## 2019-07-01 DIAGNOSIS — F339 Major depressive disorder, recurrent, unspecified: Secondary | ICD-10-CM

## 2019-07-01 DIAGNOSIS — Z79899 Other long term (current) drug therapy: Secondary | ICD-10-CM

## 2019-07-01 DIAGNOSIS — F5105 Insomnia due to other mental disorder: Secondary | ICD-10-CM

## 2019-07-01 DIAGNOSIS — F419 Anxiety disorder, unspecified: Secondary | ICD-10-CM | POA: Diagnosis not present

## 2019-07-01 DIAGNOSIS — E785 Hyperlipidemia, unspecified: Secondary | ICD-10-CM | POA: Diagnosis not present

## 2019-07-01 DIAGNOSIS — S52532A Colles' fracture of left radius, initial encounter for closed fracture: Secondary | ICD-10-CM

## 2019-07-01 DIAGNOSIS — W0110XD Fall on same level from slipping, tripping and stumbling with subsequent striking against unspecified object, subsequent encounter: Secondary | ICD-10-CM | POA: Diagnosis not present

## 2019-07-01 DIAGNOSIS — E7800 Pure hypercholesterolemia, unspecified: Secondary | ICD-10-CM

## 2019-07-01 DIAGNOSIS — Z720 Tobacco use: Secondary | ICD-10-CM

## 2019-07-01 HISTORY — DX: Colles' fracture of left radius, initial encounter for closed fracture: S52.532A

## 2019-07-01 MED ORDER — AMLODIPINE BESYLATE 10 MG PO TABS: 10.0000 mg | ORAL_TABLET | Freq: Every day | ORAL | 3 refills | Status: DC

## 2019-07-01 MED ORDER — GABAPENTIN 600 MG PO TABS: 600.0000 mg | ORAL_TABLET | Freq: Two times a day (BID) | ORAL | 3 refills | Status: DC

## 2019-07-01 MED ORDER — SIMVASTATIN 20 MG PO TABS: 20.0000 mg | ORAL_TABLET | Freq: Every day | ORAL | 3 refills | Status: DC

## 2019-07-01 NOTE — Assessment & Plan Note (Signed)
-  This problem is chronic and stable -Patient states that she is compliant with vitamin D tablets at home -We will recheck a vitamin D level on her follow-up visit -No further work-up at this time

## 2019-07-01 NOTE — Progress Notes (Signed)
   Subjective:    Patient ID: Mia Underwood, female    DOB: July 22, 1951, 68 y.o.   MRN: [id number]  HPI  I have seen and examined this patient.  Patient is here for routine follow-up of her hypertension and depression.  Patient states that she feels well today and has no new complaints.  She did fall recently (in June) and was diagnosed with a Colles' fracture of the left radius.  He states that her wrist pain is improving and that she feels well.  She also states that she is compliant with her medications I would like her medications sent to [facility] instead of the [facility] mail order pharmacy.   Review of Systems  Constitutional: Negative.   HENT: Negative.   Respiratory: Negative.   Cardiovascular: Negative.   Gastrointestinal: Negative.   Musculoskeletal: Negative for arthralgias and back pain.       Left wrist in splint and complains of mild soreness in the left wrist  Skin: Negative.   Neurological: Negative.   Psychiatric/Behavioral: Negative.        Objective:   Physical Exam Constitutional:      Appearance: Normal appearance.  HENT:     Head: Normocephalic and atraumatic.     Mouth/Throat:     Mouth: Mucous membranes are moist.     Pharynx: Oropharynx is clear.  Cardiovascular:     Rate and Rhythm: Normal rate and regular rhythm.     Heart sounds: Normal heart sounds.  Pulmonary:     Effort: Pulmonary effort is normal.     Breath sounds: Normal breath sounds. No wheezing or rales.  Abdominal:     General: Bowel sounds are normal. There is no distension.     Palpations: Abdomen is soft.     Tenderness: There is no abdominal tenderness.  Musculoskeletal:        General: No swelling.     Comments: Patient with left wrist in splint.  On exam, there is no swelling and only minimal tenderness to palpation of the left wrist  Neurological:     General: No focal deficit present.     Mental Status: She is alert and oriented to person, place, and time.  Psychiatric:         Mood and Affect: Mood normal.        Behavior: Behavior normal.           Assessment & Plan:  Please see problem based charting for assessment and plan:

## 2019-07-01 NOTE — Assessment & Plan Note (Signed)
-  This problem is chronic and stable -Patient has been slowly trying to cut back on the number of cigarettes that she is smoking -Patient states that she smokes 1 pack every 2 to 3 weeks -Expressed the importance of continuing to cut back on the number of cigarettes that she smokes and she expressed understanding -We will follow-up with this on the next visit

## 2019-07-01 NOTE — Assessment & Plan Note (Signed)
>>  ASSESSMENT AND PLAN FOR BILATERAL LEG CRAMPS WRITTEN ON 07/01/2019 10:21 AM BY NARENDRA, NISCHAL, MD  -Patient denies any leg cramps currently -Overall she appears to be doing well -I have refilled her gabapentin 600 mg twice daily on this visit -I will reassess the need for continued gabapentin use on her follow-up visit

## 2019-07-01 NOTE — Assessment & Plan Note (Signed)
-  This problem is chronic and stable -Patient states that she uses trazodone only as needed to help her sleep and that she has not needed to use this much -She will follow-up with Dr. Casimiro Needle for this -No further work-up at this time

## 2019-07-01 NOTE — Assessment & Plan Note (Signed)
-  This problem is chronic and stable -Patient states that her mood is doing well today and that overall she feels good -Patient is compliant with sertraline 100 mg daily as well as trazodone 50 mg as needed at night -Patient follows up with Dr. Casimiro Needle for this and will continue to do so -Her PHQ 9 score today is only 1 -We will continue to monitor closely -No further work-up at this time

## 2019-07-01 NOTE — Assessment & Plan Note (Signed)
BP Readings from Last 3 Encounters:  07/01/19 125/61  05/04/19 112/76  02/10/19 (!) 177/65    Lab Results  Component Value Date   NA 142 09/09/2018   K 4.3 09/09/2018   CREATININE 0.66 09/09/2018    Assessment: Blood pressure control:  Well controlled Progress toward BP goal:   At goal Comments: Patient is compliant with lisinopril/HCTZ 20/12.5 mg as well as amlodipine 10 mg  Plan: Medications:  continue current medications Educational resources provided:   Self management tools provided:   Other plans:

## 2019-07-01 NOTE — Patient Instructions (Signed)
-  It was a pleasure meeting you today -We will check your blood work on your follow-up visit including your vitamin D and your kidney function -I have sent in refills to [facility] for your medications -Please follow-up with Dr. Percell Miller for the fracture in the wrist -Please continue to try to quit smoking -Please call me if you have any questions or concerns or difficulty obtaining her medications

## 2019-07-01 NOTE — Assessment & Plan Note (Signed)
-  Patient denies any leg cramps currently -Overall she appears to be doing well -I have refilled her gabapentin 600 mg twice daily on this visit -I will reassess the need for continued gabapentin use on her follow-up visit

## 2019-07-01 NOTE — Assessment & Plan Note (Signed)
-  This problem is chronic and stable -Patient is compliant with simvastatin 20 mg daily -We will refill his medication for her today -No complaints of myalgias or other evidence of intolerance to statin

## 2019-07-01 NOTE — Assessment & Plan Note (Signed)
-  Patient states that she was doing yard work and wearing foot flops and tripped and fell on her outstretched left hand in June -Patient was seen in the ED for this and was diagnosed with a Colles' fracture of the left radius -Patient was referred to hand surgery and has been following with Dr. Percell Miller as an outpatient -Per Dr. Percell Miller his last progress note patient's left wrist continues to heal well -He has another follow-up appointment with Dr. Percell Miller coming up and if everything is continuing to heal well she will not need further follow-up with him -Patient does complain of some mild tenderness over her left wrist but other than that states that she feels well -On exam there is only minimal tenderness to palpation of the left wrist and no swelling noted -We will continue with current wrist splint for now

## 2019-07-08 ENCOUNTER — Ambulatory Visit
Admission: RE | Admit: 2019-07-08 | Discharge: 2019-07-08 | Disposition: A | Discharge status: Home / Self Care | Payer: Medicare HMO | Source: Ambulatory Visit | Attending: Internal Medicine | Admitting: Internal Medicine

## 2019-07-08 ENCOUNTER — Other Ambulatory Visit: Payer: Self-pay

## 2019-07-08 DIAGNOSIS — Z1231 Encounter for screening mammogram for malignant neoplasm of breast: Secondary | ICD-10-CM | POA: Diagnosis not present

## 2019-07-09 DIAGNOSIS — M25532 Pain in left wrist: Secondary | ICD-10-CM | POA: Diagnosis not present

## 2019-07-22 ENCOUNTER — Ambulatory Visit (INDEPENDENT_AMBULATORY_CARE_PROVIDER_SITE_OTHER): Payer: Medicare HMO | Admitting: Psychiatry

## 2019-07-22 ENCOUNTER — Other Ambulatory Visit: Payer: Self-pay

## 2019-07-22 DIAGNOSIS — F325 Major depressive disorder, single episode, in full remission: Secondary | ICD-10-CM

## 2019-07-22 MED ORDER — SERTRALINE HCL 100 MG PO TABS: ORAL_TABLET | ORAL | 5 refills | Status: DC

## 2019-07-22 NOTE — Progress Notes (Signed)
Psychiatric Initial Adult Assessment   Patient Identification: Mia Underwood MRN:  [id number] Date of Evaluation:  07/22/2019 Referral Source: [facility] [place] Chief Complaint: Clinical depression Today the patient is doing very well.  The patient still drives.  She still takes care of her granddaughter who is 67 years old and seems to be fairly well behaved.  Patient recently saw her primary care doctor and got a good report.  Her mammogram was negative.  Financially she is stable.  She lives in [place] but likes her apartment.  The patient stays active.  She is sleeping very well and eating well.  She takes trazodone only now and then.  She denies the use of alcohol or drugs.  She denies any psychotic symptoms.  The patient denies cough or fever or any shortness of breath.  She denies any chest pain.  She denies any neurological symptoms at this time.  She seems very contented.    Past Psychiatric History:   Previous Psychotropic Medications: Yes   Substance Abuse History in the last 12 months:  Yes.    Consequences of Substance Abuse:   Past Medical History:  Past Medical History:  Diagnosis Date  . Allergic rhinitis 05/04/2016  . Bilateral carpal tunnel syndrome 10/18/2010   Left > Right   . Diverticulosis of sigmoid colon 06/07/2010  . Essential hypertension 09/14/2009  . Hyperlipidemia 09/14/2009  . 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 Major depression, recurrent, chronic ([place]) 09/14/2009  . Marijuana abuse 04/04/2016  . Tobacco abuse 01/17/2016  . Upper respiratory infection, viral 02/03/2019  . Vitamin D deficiency 04/04/2016    Past Surgical History:  Procedure Laterality Date  . ABDOMINAL HYSTERECTOMY  April 1996   For fibroids    Family Psychiatric History:   Family History:  Family History  Problem Relation Age of Onset  . Breast cancer Mother   . Depression Mother   . Hypertension Mother   . Dementia Mother   . Cancer Mother    Breast  . Alcoholism Father        Died in his 62's related to alcohol abuse  . Leukemia Sister 8  . Cancer Sister        leukemia  . Early death Brother        Died in a motor vehicle accident  . Heart attack Daughter 68       Per patient report, specifics unknown  . Diabetes Mellitus II Maternal Grandmother   . Diabetes Mellitus II Paternal Grandmother   . Healthy Sister   . Healthy Sister   . Arthritis Brother   . Healthy Brother   . Healthy Brother   . Healthy Son   . Breast cancer Maternal Aunt   . Breast cancer Cousin   . Breast cancer Maternal Aunt     Social History:   Social History   Socioeconomic History  . Marital status: Single    Spouse name: Not on file  . Number of children: 2  . Years of education: 12 grade  . Highest education level: Not on file  Occupational History  . Occupation: Leisure centre manager: [employer]    Comment: Retired  Scientific laboratory technician  . Financial resource strain: Not on file  . Food insecurity    Worry: Not on file    Inability: Not on file  . Transportation needs    Medical: Not on file    Non-medical: Not on file  Tobacco Use  .  Smoking status: Current Some Day Smoker    Packs/day: 0.20    Types: Cigarettes    Start date: 12/02/1973  . Smokeless tobacco: Never Used  . Tobacco comment: Stopped in 1980 restarted in 1985. 1pack every 8 days.  Stopped on 01/20/2016. Restarted again. Now 1 pack per week  Substance and Sexual Activity  . Alcohol use: Yes    Comment: 0-3 drinks per week (beer or wine)  . Drug use: No  . Sexual activity: Not Currently  Lifestyle  . Physical activity    Days per week: Not on file    Minutes per session: Not on file  . Stress: Not on file  Relationships  . Social Herbalist on phone: Not on file    Gets together: Not on file    Attends religious service: Not on file    Active member of club or organization: Not on file    Attends meetings of clubs or organizations: Not on file     Relationship status: Not on file  Other Topics Concern  . Not on file  Social History Narrative   Graduated from [employer] in [place].  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      Current Social History 02/10/2019        Patient lives with granddaughter in an apartment on the first floor. There are not steps up to the entrance the patient uses.       Patient's method of transportation is personal car.      The highest level of education was high school diploma.      The patient currently retired.      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 Penicillins Hives  . Naproxen Rash    Metabolic Disorder Labs: No results found for: HGBA1C, MPG No results found for: PROLACTIN Lab Results  Component Value Date   CHOL 187 08/03/2016   TRIG 66 08/03/2016   HDL 90 08/03/2016   CHOLHDL 2.1 08/03/2016   VLDL 20 11/01/2010   LDLCALC 84 08/03/2016   LDLCALC 127 (H) 01/03/2016     Current Medications: Current Outpatient Medications  Medication Sig Dispense Refill  . acetaminophen (TYLENOL) 325 MG tablet Take 2 tablets (650 mg total) by mouth every 6 (six) hours as needed for moderate pain. 56 tablet 0  . amLODipine (NORVASC) 10 MG tablet Take 1 tablet (10 mg total) by mouth daily. 90 tablet 3  . aspirin EC 81 MG tablet Take 1 tablet (81 mg total) by mouth daily. (Patient not taking: Reported on 02/10/2019) 90 tablet 3  . calcium carbonate (OS-CAL) 600 MG TABS tablet Take 1 tablet (600 mg total) by mouth 2 (two) times daily with a  meal. 180 tablet 3  . cetirizine (ZYRTEC) 10 MG tablet Take 1 tablet (10 mg total) by mouth at bedtime. 90 tablet 3  . gabapentin (NEURONTIN) 600 MG tablet Take 1 tablet (600  mg total) by mouth 2 (two) times daily. 180 tablet 3  . lisinopril-hydrochlorothiazide (ZESTORETIC) 20-25 MG tablet Take 2 tablets by mouth daily. 180 tablet 3  . sertraline (ZOLOFT) 100 MG tablet 1  qam 60 tablet 5  . simvastatin (ZOCOR) 20 MG tablet Take 1 tablet (20 mg total) by mouth daily. 90 tablet 3  . traZODone (DESYREL) 50 MG tablet 2  qhs 60 tablet 5  . Vitamin D, Cholecalciferol, 25 MCG (1000 UT) TABS Take 25 mcg/day by mouth every morning. 90 tablet 3   No current facility-administered medications for this visit.     Neurologic: Headache: No Seizure: No Paresthesias:No  Musculoskeletal: Strength & Muscle Tone: within normal limits Gait & Station: normal Patient leans: N/A  Psychiatric Specialty Exam: ROS  There were no vitals taken for this visit.There is no height or weight on file to calculate BMI.  General Appearance: Casual  Eye Contact:  Fair  Speech:  Clear and Coherent  Volume:  Normal  Mood:  Depressed  Affect:  Blunt  Thought Process:  Goal Directed  Orientation:  Full (Time, Place, and Person)  Thought Content:  WDL  Suicidal Thoughts:  No  Homicidal Thoughts:  No  Memory:  NA  Judgement:  Good  Insight:  Good  Psychomotor Activity:  Normal  Concentration:    Recall:  [place] of Knowledge:Good  Language: Poor  Akathisia:  No  Handed:  Right  AIMS (if indicated):    Assets:  Desire for Improvement  ADL's:  Intact  Cognition: WNL  Sleep:      Treatment Plan Summary: 8/25/20201:30 PM   This patient has 1 major problem.  That is major depression which is in remission.  She will continue taking Zoloft 100 mg.  She takes trazodone on and for sleep but does not seem to be bothered all that much.  She is eating well and enjoying herself.  She is a positive sense of worth.   The patient is not suicidal.  She is functioning very well.  Her energy level is good.  This patient she will be seen again in 8 months

## 2019-07-29 ENCOUNTER — Other Ambulatory Visit (HOSPITAL_COMMUNITY): Payer: Self-pay | Admitting: Psychiatry

## 2019-08-06 ENCOUNTER — Ambulatory Visit (HOSPITAL_COMMUNITY): Payer: Medicare HMO | Admitting: Psychiatry

## 2019-08-07 ENCOUNTER — Telehealth (HOSPITAL_COMMUNITY): Payer: Self-pay

## 2019-08-07 NOTE — Telephone Encounter (Signed)
Medication refill request - patient is requesting a refill of Trazodone, last seen 07/22/19 and does not return until April 2021.

## 2019-09-02 ENCOUNTER — Telehealth: Payer: Self-pay

## 2019-09-02 NOTE — Telephone Encounter (Signed)
Pt calls and states she has had this numbness in her L foot/ leg at intervals for over a month. Denies chest pain, shortness of breath, vision, speech changes. She is given [facility] appt 10/7. Advised to please call 911 if she has any of the above named symptoms, she is agreeable

## 2019-09-02 NOTE — Telephone Encounter (Signed)
Requesting to speak with a nurse about feeling numb on the leg, states it last for couple of hours before she can walk. Please call pt back.

## 2019-09-03 ENCOUNTER — Ambulatory Visit (INDEPENDENT_AMBULATORY_CARE_PROVIDER_SITE_OTHER): Payer: Medicare HMO | Admitting: Internal Medicine

## 2019-09-03 ENCOUNTER — Other Ambulatory Visit: Payer: Self-pay

## 2019-09-03 ENCOUNTER — Encounter: Payer: Self-pay | Admitting: Internal Medicine

## 2019-09-03 VITALS — BP 165/74 | HR 73 | Temp 98.5°F | Ht 61.0 in | Wt 142.7 lb

## 2019-09-03 DIAGNOSIS — Z79899 Other long term (current) drug therapy: Secondary | ICD-10-CM

## 2019-09-03 DIAGNOSIS — R252 Cramp and spasm: Secondary | ICD-10-CM

## 2019-09-03 DIAGNOSIS — I10 Essential (primary) hypertension: Secondary | ICD-10-CM | POA: Diagnosis not present

## 2019-09-03 DIAGNOSIS — G5792 Unspecified mononeuropathy of left lower limb: Secondary | ICD-10-CM | POA: Insufficient documentation

## 2019-09-03 DIAGNOSIS — G629 Polyneuropathy, unspecified: Secondary | ICD-10-CM

## 2019-09-03 DIAGNOSIS — Z23 Encounter for immunization: Secondary | ICD-10-CM

## 2019-09-03 MED ORDER — LISINOPRIL-HYDROCHLOROTHIAZIDE 20-25 MG PO TABS: 2.0000 | ORAL_TABLET | Freq: Every day | ORAL | 3 refills | Status: DC

## 2019-09-03 NOTE — Progress Notes (Signed)
   CC: Paresthesias, Hypertension  HPI:  [name] is a 67 y.o. F with PMHx listed below presenting for Paresthesias, Hypertension. Please see the A&P for the status of the patient's chronic medical problems.  Past Medical History:  Diagnosis Date  . Allergic rhinitis 05/04/2016  . Bilateral carpal tunnel syndrome 10/18/2010   Left > Right   . Diverticulosis of sigmoid colon 06/07/2010  . Essential hypertension 09/14/2009  . Hyperlipidemia 09/14/2009  . 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 Major depression, recurrent, chronic ([place]) 09/14/2009  . Marijuana abuse 04/04/2016  . Tobacco abuse 01/17/2016  . Upper respiratory infection, viral 02/03/2019  . Vitamin D deficiency 04/04/2016   Review of Systems:  Performed and all others negative.  Physical Exam:  Vitals:   09/03/19 0922  BP: (!) 165/74  Pulse: 73  Temp: 98.5 F (36.9 C)  TempSrc: Oral  SpO2: 99%  Weight: 142 lb 11.2 oz (64.7 kg)  Height: 5\' 1"  (1.549 m)   Physical Exam Constitutional:      General: She is not in acute distress.    Appearance: Normal appearance.  Cardiovascular:     Rate and Rhythm: Normal rate and regular rhythm.     Pulses: Normal pulses.     Heart sounds: Normal heart sounds.  Pulmonary:     Effort: Pulmonary effort is normal. No respiratory distress.     Breath sounds: Normal breath sounds.  Abdominal:     General: Bowel sounds are normal. There is no distension.     Palpations: Abdomen is soft.     Tenderness: There is no abdominal tenderness.  Musculoskeletal:        General: No swelling or deformity.     Comments: No left calf nor knee swelling or tenderness  Skin:    General: Skin is warm and dry.  Neurological:     General: No focal deficit present.     Mental Status: Mental status is at baseline.     Comments: Left lower extremity strength and sensation grossly intact    Assessment & Plan:   See Encounters Tab for problem based charting.  Patient  discussed with Dr. Dareen Underwood

## 2019-09-03 NOTE — Telephone Encounter (Signed)
I agree

## 2019-09-03 NOTE — Assessment & Plan Note (Signed)
>>  ASSESSMENT AND PLAN FOR BILATERAL LEG CRAMPS WRITTEN ON 09/03/2019 11:06 AM BY MELVIN, ALEXANDER, MD  Patient has bilateral leg cramps intermittently as well as intermittent neuropathic symptoms documented elsewhere. She has had some improvement of neuropathic symptoms with Gabapentin, but it is unclear if she has had much improvement of her intermittent left cramps. No cramps currently. - Continue to monitor

## 2019-09-03 NOTE — Progress Notes (Signed)
Internal Medicine Clinic Attending  Case discussed with Dr. Melvin  at the time of the visit.  We reviewed the resident's history and exam and pertinent patient test results.  I agree with the assessment, diagnosis, and plan of care documented in the resident's note.  

## 2019-09-03 NOTE — Assessment & Plan Note (Signed)
Patient has bilateral leg cramps intermittently as well as intermittent neuropathic symptoms documented elsewhere. She has had some improvement of neuropathic symptoms with Gabapentin, but it is unclear if she has had much improvement of her intermittent left cramps. No cramps currently. - Continue to monitor

## 2019-09-03 NOTE — Assessment & Plan Note (Signed)
Patient reports several years of intemittent LLE numbness and tingling. She states this occurs about 3 times per month and will improve by the next day. She states that rubbing the area seems to help some. She has been taking Gabapentin for some time now, which she states has decreased the frequency of her symptoms.   She has come in today due to a more acute episode of paresthesias that occur 1 week ago. She was sitting with he feet up on an ottoman watching TV and when she went to get up she notice her left foot was numb and she was unable to support her weight. The foot began to have a pins and needles sensation. She crawled to where she had some crutches and she used those for the rest of the day as her sensation improved. She states that she was back to normal by the next day.  She has had some of her normal numbness and tingling since then but her strength and gross sensation has returned to normal. The episode she describes sound similar to temporary parathesia associated with nerve compression or stretch due to positioning. Will continue current Gabapentin and monitoring for now. - Gabapentin 600mg  BID - Continue to monitor

## 2019-09-03 NOTE — Patient Instructions (Addendum)
Thank you for allowing [place] to care for you  For your episode of leg numbness - This was likely an acute numbness cause by compression or stretch of a nerve - We will continue to monitor your leg numbness for now - Let us know if similar symptoms happen again or if your chronic symptoms get worse  For your blood pressure - Continue current medications - Lisinopril-HCTZ refill  Flu shot given today  Follow up with PCP on November 10th 2020

## 2019-09-03 NOTE — Assessment & Plan Note (Signed)
BP elevated today at 165/74. She states she has been out of her Lisinopril-HCTZ combination pill and needs a refill, this will be provided. - Refill Lisinopril-HCTZ 20-12.5mg  Daily - Amlodipine 10mg  Daily

## 2019-10-07 ENCOUNTER — Other Ambulatory Visit: Payer: Self-pay

## 2019-10-07 ENCOUNTER — Ambulatory Visit (INDEPENDENT_AMBULATORY_CARE_PROVIDER_SITE_OTHER): Payer: Medicare HMO | Admitting: Internal Medicine

## 2019-10-07 DIAGNOSIS — E785 Hyperlipidemia, unspecified: Secondary | ICD-10-CM

## 2019-10-07 DIAGNOSIS — E559 Vitamin D deficiency, unspecified: Secondary | ICD-10-CM | POA: Diagnosis not present

## 2019-10-07 DIAGNOSIS — G5792 Unspecified mononeuropathy of left lower limb: Secondary | ICD-10-CM | POA: Diagnosis not present

## 2019-10-07 DIAGNOSIS — F339 Major depressive disorder, recurrent, unspecified: Secondary | ICD-10-CM | POA: Diagnosis not present

## 2019-10-07 DIAGNOSIS — I10 Essential (primary) hypertension: Secondary | ICD-10-CM | POA: Diagnosis not present

## 2019-10-07 DIAGNOSIS — Z79899 Other long term (current) drug therapy: Secondary | ICD-10-CM | POA: Diagnosis not present

## 2019-10-07 DIAGNOSIS — Z9114 Patient's other noncompliance with medication regimen: Secondary | ICD-10-CM | POA: Diagnosis not present

## 2019-10-07 DIAGNOSIS — E7800 Pure hypercholesterolemia, unspecified: Secondary | ICD-10-CM

## 2019-10-07 DIAGNOSIS — R252 Cramp and spasm: Secondary | ICD-10-CM

## 2019-10-07 NOTE — Assessment & Plan Note (Signed)
-  This problem is chronic and stable -Patient has been compliant with her vitamin D - we will have patient take her medications and follow-up in 1 month for repeat vitamin D level

## 2019-10-07 NOTE — Assessment & Plan Note (Signed)
>>  ASSESSMENT AND PLAN FOR BILATERAL LEG CRAMPS WRITTEN ON 10/07/2019  9:06 AM BY NARENDRA, NISCHAL, MD  -Patient has a history of intermittent bilateral lower extremity leg cramps -The etiology of her cramps remains unclear at this time -I suspect that this may be secondary to electrolyte abnormalities (on losartan and HCTZ).  However, patient has not been compliant with her medications and do not believe that checking her electrolytes off the medications will be as beneficial -We will have patient take her medications and follow-up with me in 1 month and we will repeat a BMP at that time -Would also consider checking a CBC at that time as this has not been checked since 2011 and iron deficiency anemia can sometimes cause leg cramping

## 2019-10-07 NOTE — Assessment & Plan Note (Signed)
-  This post chronic and stable -Patient has not been compliant with her simvastatin. -We will have patient take her statin and follow-up with me in 1 month

## 2019-10-07 NOTE — Progress Notes (Signed)
   Subjective:    Patient ID: Mia Underwood, female    DOB: 1951-03-31, 68 y.o.   MRN: [id number]  HPI  I have seen and examined this patient.  Patient is here for routine follow-up of her hypertension and depression.  Patient states that she has had a lot of personal issues going on over the last couple of months and this is frustrated her.  She states that she has not been taking her medications secondary to these frustrations and has not been exercising either.  She denies any other complaints at this time.  Review of Systems  Constitutional: Negative.   HENT: Negative.   Respiratory: Negative.   Cardiovascular: Negative.   Gastrointestinal: Negative.   Musculoskeletal:       Complains of intermittent lower extremity leg cramps  Skin: Negative.   Neurological: Negative.   Psychiatric/Behavioral:       Complains of feelings of frustration and sadness related to personal issues       Objective:   Physical Exam Constitutional:      Appearance: Normal appearance.  HENT:     Head: Normocephalic and atraumatic.  Neck:     Musculoskeletal: Neck supple.  Cardiovascular:     Rate and Rhythm: Normal rate and regular rhythm.     Heart sounds: Normal heart sounds.  Pulmonary:     Effort: Pulmonary effort is normal.     Breath sounds: Normal breath sounds.  Abdominal:     General: Bowel sounds are normal. There is no distension.     Palpations: Abdomen is soft.     Tenderness: There is no abdominal tenderness.  Musculoskeletal:        General: No swelling or tenderness.  Lymphadenopathy:     Cervical: No cervical adenopathy.  Neurological:     General: No focal deficit present.     Mental Status: She is alert and oriented to person, place, and time.  Psychiatric:        Behavior: Behavior normal.     Comments: Appears depressed           Assessment & Plan:  Please see problem based charting for assessment and plan:

## 2019-10-07 NOTE — Patient Instructions (Signed)
-  It was a pleasure seeing you today -Your blood pressure is high today.  Please take your medications as directed and follow-up with me in 1 month -I will recheck your blood work after follow-up visit on the medications -Please follow-up with Dr. Casimiro Needle. -Please call me if you have any questions or concerns

## 2019-10-07 NOTE — Assessment & Plan Note (Signed)
-  Patient still complains of intermittent left lower extremity numbness tingling which has been ongoing for several years -Patient states that this has not worsened or gotten better over the last couple of months -She was prescribed gabapentin 600 mg twice daily for this but has not been compliant with this medication -We will have her take the gabapentin and follow-up with me in 1 month

## 2019-10-07 NOTE — Assessment & Plan Note (Signed)
-  Patient states that she has had multiple personal issues going on in her life for the last couple of months which has led to depressed mood and her noncompliance with her medications -Patient also states that the therapist that she used to follow-up with has not been receptive during their sessions and she stopped following up with her as well -She also stopped taking her sertraline 100 mg daily.  I encouraged the patient to resume taking her sertraline and to follow-up with Dr. Casimiro Needle.  Patient states that she will make an appointment Dr. Casimiro Needle after going home today -I also offered to refer her to another therapist but patient refused at this time and states that she wants to talk to Dr. Casimiro Needle before doing anything else -[name] score today is 3 which is up from 1 on a previous visit -We will follow-up in 1 month

## 2019-10-07 NOTE — Assessment & Plan Note (Addendum)
-  Patient has a history of intermittent bilateral lower extremity leg cramps -The etiology of her cramps remains unclear at this time -I suspect that this may be secondary to electrolyte abnormalities (on losartan and HCTZ).  However, patient has not been compliant with her medications and do not believe that checking her electrolytes off the medications will be as beneficial -We will have patient take her medications and follow-up with me in 1 month and we will repeat a BMP at that time -Would also consider checking a CBC at that time as this has not been checked since 2011 and iron deficiency anemia can sometimes cause leg cramping

## 2019-10-07 NOTE — Assessment & Plan Note (Signed)
BP Readings from Last 3 Encounters:  10/07/19 (!) 170/77  09/03/19 (!) 165/74  07/01/19 125/61    Lab Results  Component Value Date   NA 142 09/09/2018   K 4.3 09/09/2018   CREATININE 0.66 09/09/2018    Assessment: Blood pressure control:  Uncontrolled Progress toward BP goal:   Deteriorated Comments: Patient is not been compliant with her losartan/HCTZ 20/25 mg twice daily as well as her amlodipine 10 mg daily.  Patient states that the last time she took these were over a month ago.  Plan: Medications:  continue current medications Educational resources provided:   Self management tools provided:   Other plans: We will have patient take her medications and follow-up with me in 1 month for repeat blood pressure as well as blood work

## 2019-11-04 ENCOUNTER — Ambulatory Visit: Payer: Medicare HMO | Admitting: Internal Medicine

## 2019-11-07 ENCOUNTER — Telehealth: Payer: Self-pay | Admitting: Internal Medicine

## 2019-12-02 ENCOUNTER — Ambulatory Visit
Admission: RE | Admit: 2019-12-02 | Discharge: 2019-12-02 | Disposition: A | Discharge status: Home / Self Care | Payer: Medicare HMO | Source: Ambulatory Visit | Attending: Internal Medicine | Admitting: Internal Medicine

## 2019-12-02 ENCOUNTER — Other Ambulatory Visit: Payer: Self-pay

## 2019-12-02 DIAGNOSIS — E2839 Other primary ovarian failure: Secondary | ICD-10-CM

## 2019-12-02 DIAGNOSIS — M85852 Other specified disorders of bone density and structure, left thigh: Secondary | ICD-10-CM | POA: Diagnosis not present

## 2019-12-02 DIAGNOSIS — Z780 Asymptomatic menopausal state: Secondary | ICD-10-CM | POA: Diagnosis not present

## 2019-12-16 ENCOUNTER — Ambulatory Visit (INDEPENDENT_AMBULATORY_CARE_PROVIDER_SITE_OTHER): Payer: Medicare HMO | Admitting: Internal Medicine

## 2019-12-16 VITALS — BP 145/81 | HR 79 | Temp 98.3°F | Ht 61.0 in | Wt 149.5 lb

## 2019-12-16 DIAGNOSIS — E785 Hyperlipidemia, unspecified: Secondary | ICD-10-CM

## 2019-12-16 DIAGNOSIS — M25511 Pain in right shoulder: Secondary | ICD-10-CM | POA: Diagnosis not present

## 2019-12-16 DIAGNOSIS — Z0000 Encounter for general adult medical examination without abnormal findings: Secondary | ICD-10-CM

## 2019-12-16 DIAGNOSIS — Z720 Tobacco use: Secondary | ICD-10-CM

## 2019-12-16 DIAGNOSIS — I10 Essential (primary) hypertension: Secondary | ICD-10-CM

## 2019-12-16 DIAGNOSIS — M8580 Other specified disorders of bone density and structure, unspecified site: Secondary | ICD-10-CM

## 2019-12-16 DIAGNOSIS — R252 Cramp and spasm: Secondary | ICD-10-CM

## 2019-12-16 DIAGNOSIS — F419 Anxiety disorder, unspecified: Secondary | ICD-10-CM

## 2019-12-16 DIAGNOSIS — F418 Other specified anxiety disorders: Secondary | ICD-10-CM

## 2019-12-16 DIAGNOSIS — E559 Vitamin D deficiency, unspecified: Secondary | ICD-10-CM | POA: Diagnosis not present

## 2019-12-16 DIAGNOSIS — F5105 Insomnia due to other mental disorder: Secondary | ICD-10-CM

## 2019-12-16 DIAGNOSIS — Z79899 Other long term (current) drug therapy: Secondary | ICD-10-CM

## 2019-12-16 DIAGNOSIS — G5792 Unspecified mononeuropathy of left lower limb: Secondary | ICD-10-CM | POA: Diagnosis not present

## 2019-12-16 DIAGNOSIS — M542 Cervicalgia: Secondary | ICD-10-CM | POA: Diagnosis not present

## 2019-12-16 DIAGNOSIS — E7800 Pure hypercholesterolemia, unspecified: Secondary | ICD-10-CM

## 2019-12-16 DIAGNOSIS — M25512 Pain in left shoulder: Secondary | ICD-10-CM

## 2019-12-16 DIAGNOSIS — F339 Major depressive disorder, recurrent, unspecified: Secondary | ICD-10-CM | POA: Diagnosis not present

## 2019-12-16 NOTE — Assessment & Plan Note (Addendum)
-  Patient noted to have osteopenia on recent DEXA scan with a T score of -1.7 -Patient's FRAX score was 1.3% for hip fracture and 9.4% for a major osteoporotic fracture. -No need for bisphosphonate at current time -We will follow up calcium level but patient does not require calcium supplementation at this time -We will follow vitamin D level and supplement appropriately -We will repeat DEXA scan in 2 years

## 2019-12-16 NOTE — Assessment & Plan Note (Signed)
-  Patient states that she has had numerous stressors in her life currently including her daughter dying last year and her grandson in prison and her granddaughter with a new baby. -States that last month she had an episode of severe depression and did not shower for a week -She states that currently this is doing better -[name] score today is 3 -We will continue with sertraline 100 mg daily.  Patient to follow-up with Dr. Judithann Graves as an outpatient. -Patient will call the pharmacy for a refill of this medication as she had 5 refills left. -No further work-up at this time.

## 2019-12-16 NOTE — Assessment & Plan Note (Signed)
>>  ASSESSMENT AND PLAN FOR BILATERAL LEG CRAMPS WRITTEN ON 12/16/2019  4:05 PM BY Earl Lagos, MD  -Patient states that her bilateral leg cramps have improved but she still has some intermittent leg cramps -This may be secondary to electrolyte abnormalities while on losartan and HCTZ -However, patient has not had a CBC since 2011 and iron deficiency anemia can sometimes cause leg cramping as well. -We will recheck her CBC and BMP today -No further work-up at this time

## 2019-12-16 NOTE — Assessment & Plan Note (Signed)
-  Patient wanted information on the COVID-19 vaccine.  Risks and benefits of the vaccine were discussed with the patient today in detail. -Information given to the patient by Ms. Maime regarding scheduling information for Covid vaccine

## 2019-12-16 NOTE — Progress Notes (Signed)
   Subjective:    Patient ID: Mia Underwood, female    DOB: 09-06-1951, 69 y.o.   MRN: [id number]  HPI  I have seen and examined this patient.  Patient is here for routine follow-up of her hypertension and depression.  Patient states that she is under a lot of stress at home with her daughter dying last year and one of her grandsons in prison and another granddaughter with a young baby.  Patient states that she has had episodes of depression over the last month and has been intermittently compliant with her medications.  She states that she feels well otherwise.   Review of Systems  Constitutional: Negative.   HENT: Negative.   Respiratory: Negative.   Cardiovascular: Negative.   Gastrointestinal: Negative.   Musculoskeletal: Positive for arthralgias.       Patient complains of soreness over her neck as well as her shoulders  Neurological: Negative.   Psychiatric/Behavioral: The patient is nervous/anxious.        Objective:   Physical Exam Constitutional:      Appearance: Normal appearance.  HENT:     Head: Normocephalic and atraumatic.  Cardiovascular:     Rate and Rhythm: Normal rate and regular rhythm.     Heart sounds: Normal heart sounds.  Pulmonary:     Effort: Pulmonary effort is normal.     Breath sounds: Normal breath sounds. No wheezing or rales.  Abdominal:     General: Bowel sounds are normal. There is no distension.     Palpations: Abdomen is soft.     Tenderness: There is no abdominal tenderness.  Musculoskeletal:        General: No swelling or tenderness.     Cervical back: Neck supple.  Lymphadenopathy:     Cervical: No cervical adenopathy.  Neurological:     General: No focal deficit present.     Mental Status: She is alert and oriented to person, place, and time.  Psychiatric:        Behavior: Behavior normal.     Comments: Patient appears anxious           Assessment & Plan:  Please see problem based charting for assessment and plan:

## 2019-12-16 NOTE — Patient Instructions (Signed)
-  Was a pleasure seeing you today -We will check some blood work on you today including your cholesterol level, blood counts, kidney function as well as your vitamin D - please speak with Dr. Casimiro Needle regarding your trazodone refill and your worsening anxiety -Your bone scan showed some thinning of your bones called osteopenia.  We will recheck this again in 2 years. -Please continue taking medications as prescribed.  Please call me if you need refills on any medication. -I was unable to prescribe trazodone currently.  Please speak to Dr. Casimiro Needle about this. -Please call me with any questions or concerns.

## 2019-12-16 NOTE — Assessment & Plan Note (Signed)
-  This problem is chronic and stable -Patient is intermittently compliant with simvastatin 20 mg daily -We will recheck her lipid panel today

## 2019-12-16 NOTE — Assessment & Plan Note (Signed)
-  Patient smokes approximately 1 pack/week -She states that she called the quit line last week and got some nicotine lozenges -She states that this can occasionally make her feel nauseous and that she may want to switch to the nicotine gum -She is trying to quit this year -Patient encouraged to do so I congratulated for taking the effort to try and quit -No further work-up at this time.  We will readdress this at follow-up visit

## 2019-12-16 NOTE — Assessment & Plan Note (Signed)
BP Readings from Last 3 Encounters:  12/16/19 (!) 145/81  10/07/19 (!) 170/77  09/03/19 (!) 165/74    Lab Results  Component Value Date   NA 142 09/09/2018   K 4.3 09/09/2018   CREATININE 0.66 09/09/2018    Assessment: Blood pressure control:  Fair Progress toward BP goal:   Improved Comments: Patient states that she is intermittently compliant with lisinopril/HCTZ 20/25 mg 2 tabs daily as well is amlodipine 10 mg daily  Plan: Medications:  continue current medications Educational resources provided:   Self management tools provided:   Other plans: We will check BMP today

## 2019-12-16 NOTE — Assessment & Plan Note (Signed)
-  This problem is chronic and stable -Patient has been only intermittently compliant with her vitamin D - we will recheck her vitamin D level today -No further work-up at this time

## 2019-12-16 NOTE — Assessment & Plan Note (Signed)
-  Patient appears more anxious today with an elevated GAD-7 score of 11. -Patient also states that she is close to running out of trazodone but has not been able to get in touch with her psychiatrist (Dr. Casimiro Needle). -Patient is also on SSRI for anxiety as well as depression.  Patient wanted a refill for this but has 5 refills on her current bottle and I asked her to call the pharmacy to refill this for her. -Patient states the trazodone gives her a headache and that she may want to change her medication possibly to Ambien.  I asked her to discuss this with her psychiatrist. -I also explained that trazodone in combination with SSRI can increase her risk for serotonin syndrome and that I do not feel comfortable prescribing this for her.  She expressed understanding and will discuss with Dr. Casimiro Needle as to further measures

## 2019-12-16 NOTE — Assessment & Plan Note (Signed)
-  Patient states that her bilateral leg cramps have improved but she still has some intermittent leg cramps -This may be secondary to electrolyte abnormalities while on losartan and HCTZ -However, patient has not had a CBC since 2011 and iron deficiency anemia can sometimes cause leg cramping as well. -We will recheck her CBC and BMP today -No further work-up at this time

## 2019-12-17 LAB — VITAMIN D 25 HYDROXY (VIT D DEFICIENCY, FRACTURES): Vit D, 25-Hydroxy: 28.7 ng/mL — ABNORMAL LOW (ref 30.0–100.0)

## 2019-12-17 LAB — BMP8+ANION GAP
Anion Gap: 16 mmol/L (ref 10.0–18.0)
BUN/Creatinine Ratio: 19 (ref 12–28)
BUN: 14 mg/dL (ref 8–27)
CO2: 21 mmol/L (ref 20–29)
Calcium: 9.8 mg/dL (ref 8.7–10.3)
Chloride: 106 mmol/L (ref 96–106)
Creatinine, Ser: 0.72 mg/dL (ref 0.57–1.00)
GFR calc Af Amer: 100 mL/min/{1.73_m2} (ref >59)
GFR calc non Af Amer: 87 mL/min/{1.73_m2} (ref >59)
Glucose: 100 mg/dL — ABNORMAL HIGH (ref 65–99)
Potassium: 3.9 mmol/L (ref 3.5–5.2)
Sodium: 143 mmol/L (ref 134–144)

## 2019-12-17 LAB — CBC
Hematocrit: 37.7 % (ref 34.0–46.6)
Hemoglobin: 13.3 g/dL (ref 11.1–15.9)
MCH: 30.7 pg (ref 26.6–33.0)
MCHC: 35.3 g/dL (ref 31.5–35.7)
MCV: 87 fL (ref 79–97)
Platelets: 349 10*3/uL (ref 150–450)
RBC: 4.33 x10E6/uL (ref 3.77–5.28)
RDW: 13.2 % (ref 11.7–15.4)
WBC: 7.7 10*3/uL (ref 3.4–10.8)

## 2019-12-17 LAB — LIPID PANEL
Chol/HDL Ratio: 3.1 ratio (ref 0.0–4.4)
Cholesterol, Total: 200 mg/dL — ABNORMAL HIGH (ref 100–199)
HDL: 64 mg/dL (ref >39)
LDL Chol Calc (NIH): 111 mg/dL — ABNORMAL HIGH (ref 0–99)
Triglycerides: 146 mg/dL (ref 0–149)
VLDL Cholesterol Cal: 25 mg/dL (ref 5–40)

## 2019-12-18 ENCOUNTER — Encounter: Payer: Self-pay | Admitting: Internal Medicine

## 2019-12-18 DIAGNOSIS — E559 Vitamin D deficiency, unspecified: Secondary | ICD-10-CM

## 2019-12-18 MED ORDER — VITAMIN D 25 MCG (1000 UNIT) PO TABS: 1000.0000 [IU] | ORAL_TABLET | Freq: Every day | ORAL | 1 refills | Status: DC

## 2019-12-25 ENCOUNTER — Other Ambulatory Visit (HOSPITAL_COMMUNITY): Payer: Self-pay

## 2019-12-25 MED ORDER — TRAZODONE HCL 50 MG PO TABS: ORAL_TABLET | ORAL | 5 refills | Status: DC

## 2020-01-23 ENCOUNTER — Ambulatory Visit: Payer: Medicare HMO | Attending: Internal Medicine

## 2020-01-23 DIAGNOSIS — Z23 Encounter for immunization: Secondary | ICD-10-CM

## 2020-01-23 NOTE — Progress Notes (Signed)
   Covid-19 Vaccination Clinic  Name:  Mia Underwood    MRN: [id number] DOB: 1951/03/30  01/23/2020  [name] was observed post Covid-19 immunization for 15 minutes without incidence. She was provided with Vaccine Information Sheet and instruction to access the V-Safe system.   [name] was instructed to call 911 with any severe reactions post vaccine: Marland Kitchen Difficulty breathing  . Swelling of your face and throat  . A fast heartbeat  . A bad rash all over your body  . Dizziness and weakness    Immunizations Administered    Name Date Dose VIS Date Route   Pfizer COVID-19 Vaccine 01/23/2020 10:27 AM 0.3 mL 11/07/2019 Intramuscular   Manufacturer: [place]   Lot: [id number]   [place]: [id number]

## 2020-02-17 ENCOUNTER — Ambulatory Visit: Payer: Medicare HMO | Attending: Internal Medicine

## 2020-02-17 DIAGNOSIS — Z23 Encounter for immunization: Secondary | ICD-10-CM

## 2020-02-17 NOTE — Progress Notes (Signed)
   Covid-19 Vaccination Clinic  Name:  Mia Underwood    MRN: [id number] DOB: 01/09/1951  02/17/2020  [name] was observed post Covid-19 immunization for 15 minutes without incident. She was provided with Vaccine Information Sheet and instruction to access the V-Safe system.   [name] was instructed to call 911 with any severe reactions post vaccine: Marland Kitchen Difficulty breathing  . Swelling of face and throat  . A fast heartbeat  . A bad rash all over body  . Dizziness and weakness   Immunizations Administered    Name Date Dose VIS Date Route   Pfizer COVID-19 Vaccine 02/17/2020 11:58 AM 0.3 mL 11/07/2019 Intramuscular   Manufacturer: [place]   Lot: [id number]   [place]: [id number]

## 2020-03-16 ENCOUNTER — Ambulatory Visit (INDEPENDENT_AMBULATORY_CARE_PROVIDER_SITE_OTHER): Payer: Medicare HMO | Admitting: Internal Medicine

## 2020-03-16 ENCOUNTER — Other Ambulatory Visit: Payer: Self-pay

## 2020-03-16 ENCOUNTER — Encounter: Payer: Self-pay | Admitting: Internal Medicine

## 2020-03-16 DIAGNOSIS — Z720 Tobacco use: Secondary | ICD-10-CM | POA: Diagnosis not present

## 2020-03-16 DIAGNOSIS — M25512 Pain in left shoulder: Secondary | ICD-10-CM | POA: Diagnosis not present

## 2020-03-16 DIAGNOSIS — M62838 Other muscle spasm: Secondary | ICD-10-CM

## 2020-03-16 DIAGNOSIS — F5105 Insomnia due to other mental disorder: Secondary | ICD-10-CM | POA: Diagnosis not present

## 2020-03-16 DIAGNOSIS — E559 Vitamin D deficiency, unspecified: Secondary | ICD-10-CM

## 2020-03-16 DIAGNOSIS — F339 Major depressive disorder, recurrent, unspecified: Secondary | ICD-10-CM

## 2020-03-16 DIAGNOSIS — Z0000 Encounter for general adult medical examination without abnormal findings: Secondary | ICD-10-CM

## 2020-03-16 DIAGNOSIS — I10 Essential (primary) hypertension: Secondary | ICD-10-CM | POA: Diagnosis not present

## 2020-03-16 DIAGNOSIS — Z79899 Other long term (current) drug therapy: Secondary | ICD-10-CM

## 2020-03-16 DIAGNOSIS — F419 Anxiety disorder, unspecified: Secondary | ICD-10-CM

## 2020-03-16 DIAGNOSIS — F418 Other specified anxiety disorders: Secondary | ICD-10-CM

## 2020-03-16 MED ORDER — DICLOFENAC SODIUM 1 % EX GEL: 4.0000 g | Freq: Four times a day (QID) | CUTANEOUS | 3 refills | Status: DC

## 2020-03-16 MED ORDER — AMLODIPINE BESYLATE 10 MG PO TABS: 10.0000 mg | ORAL_TABLET | Freq: Every day | ORAL | 3 refills | Status: DC

## 2020-03-16 NOTE — Assessment & Plan Note (Signed)
-  Patient states that her depression is better controlled currently and that she feels better today -[name] score is 3 -Patient follows with Dr. Casimiro Needle as an outpatient for this -We will continue with sertraline 100 mg daily per Dr. Casimiro Needle -No further work-up at this time

## 2020-03-16 NOTE — Patient Instructions (Addendum)
-  It was a pleasure seeing you today -I have put in a refill for amlodipine to your [facility] -I have also put in a refill for Voltaren gel to your [facility] -Congratulations on getting the Covid vaccine!! -Please continue to follow-up with Dr. Casimiro Needle -Please call me with any questions or concerns or if you need any refills

## 2020-03-16 NOTE — Assessment & Plan Note (Signed)
BP Readings from Last 3 Encounters:  03/16/20 (!) 189/82  12/16/19 (!) 145/81  10/07/19 (!) 170/77    Lab Results  Component Value Date   NA 143 12/16/2019   K 3.9 12/16/2019   CREATININE 0.72 12/16/2019    Assessment: Blood pressure control:  Uncontrolled Progress toward BP goal:   Deteriorated Comments: Patient states that she was not able to take her medications this morning before coming to the clinic.  Her blood pressure was better controlled at her last visit.  Will not change her medications at this time but recheck her blood pressure at her follow-up visit.  Patient is compliant with lisinopril/HCTZ 20/25 mg 2 tabs daily as well as amlodipine 10 mg daily  Plan: Medications:  continue current medications Educational resources provided:   Self management tools provided:   Other plans:

## 2020-03-16 NOTE — Assessment & Plan Note (Signed)
-  Patient states that she had contacted the quit line at her last visit and obtain nicotine lozenges.  Patient states that she did not like the taste of the nicotine lozenges and then called them back and had nicotine gum sent to her -Patient states that this helped her to decrease the amount that she was smoking but over the last couple of months both her brother and sister have been ill admitted to the hospital and she has had more stress.  She is back to smoking approximately 1 pack/week -Patient encouraged to try and quit and states that she will try and do so especially now that her siblings are doing better -No further work-up at this time

## 2020-03-16 NOTE — Assessment & Plan Note (Signed)
-  This problem is chronic and stable -On patient's last visit she was noted to have a vitamin D level was borderline low -She was encouraged to continue on vitamin D supplementation.  Patient states that she does have vitamin D at home but is not been taking this consistently.  She will take this daily for now -We will recheck her vitamin D level at her next visit

## 2020-03-16 NOTE — Assessment & Plan Note (Signed)
-  Patient has completed both doses of her COVID-19 vaccine

## 2020-03-16 NOTE — Assessment & Plan Note (Signed)
-  Patient does complain of recurrent pain over her left shoulder -It appears to be more muscular in nature.  She has no tenderness to palpation currently but states that she gets intermittent pain especially with changes in the weather -Patient states that the pain is improved with Voltaren gel which her granddaughter helps her apply -We will refill Voltaren gel for her today -No further work-up at this time

## 2020-03-16 NOTE — Progress Notes (Signed)
   Subjective:    Patient ID: Mia Underwood, female    DOB: Nov 21, 1951, 69 y.o.   MRN: [id number]  HPI  I have seen and examined this patient.  Patient is here for routine follow-up of her hypertension and shoulder pain.  Patient states that she thinks that the weather may be causing some pain in her shoulders.  She uses Voltaren gel at home for this and states that her granddaughter is able to apply this for her and it improves her pain.  Patient denies any other complaints currently and states that her depression is under better control.  She states that she is compliant with all her medications.   Review of Systems  Constitutional: Negative.   HENT: Negative.   Respiratory: Negative.   Cardiovascular: Negative.   Gastrointestinal: Negative.   Musculoskeletal: Positive for arthralgias.  Neurological: Negative.   Psychiatric/Behavioral: Negative.        Objective:   Physical Exam Constitutional:      Appearance: Normal appearance.  HENT:     Head: Normocephalic and atraumatic.  Eyes:     Pupils: Pupils are equal, round, and reactive to light.  Cardiovascular:     Rate and Rhythm: Normal rate and regular rhythm.     Heart sounds: Normal heart sounds.  Pulmonary:     Effort: Pulmonary effort is normal.     Breath sounds: Normal breath sounds. No wheezing or rales.  Abdominal:     General: Bowel sounds are normal. There is no distension.     Palpations: Abdomen is soft.     Tenderness: There is no abdominal tenderness.  Musculoskeletal:        General: No swelling or tenderness.     Cervical back: Neck supple.  Lymphadenopathy:     Cervical: No cervical adenopathy.  Neurological:     General: No focal deficit present.     Mental Status: She is alert and oriented to person, place, and time.  Psychiatric:        Mood and Affect: Mood normal.        Behavior: Behavior normal.           Assessment & Plan:  Please see problem based charting for assessment and  plan:

## 2020-03-16 NOTE — Assessment & Plan Note (Signed)
-  This problem is chronic and stable -Patient states that she is out of her trazodone but I told her that the bottle still has 5 refills -She states that she obtains this from Dr. Casimiro Needle and that it helps her sleep -Instructed her to call the pharmacy for refill of this medication or to contact Dr. Casimiro Needle in case she needed more refills -Patient expressed understanding and is agreement with plan

## 2020-03-18 ENCOUNTER — Encounter: Payer: Self-pay | Admitting: *Deleted

## 2020-03-18 NOTE — Progress Notes (Unsigned)

## 2020-03-24 ENCOUNTER — Telehealth (INDEPENDENT_AMBULATORY_CARE_PROVIDER_SITE_OTHER): Payer: Medicare HMO | Admitting: Psychiatry

## 2020-03-24 ENCOUNTER — Other Ambulatory Visit: Payer: Self-pay

## 2020-03-24 DIAGNOSIS — F325 Major depressive disorder, single episode, in full remission: Secondary | ICD-10-CM

## 2020-03-24 MED ORDER — TRAZODONE HCL 50 MG PO TABS: ORAL_TABLET | ORAL | 5 refills | Status: DC

## 2020-03-24 MED ORDER — SERTRALINE HCL 100 MG PO TABS: ORAL_TABLET | ORAL | 5 refills | Status: DC

## 2020-03-24 NOTE — Progress Notes (Unsigned)
Things That May Be Affecting Your Health:  Alcohol  Hearing loss  Pain   x Depression  Home Safety  Sexual Health   Diabetes  Lack of physical activity  Stress   Difficulty with daily activities  Loneliness x Tiredness   Drug use  Medicines x Tobacco use   Falls  Motor Vehicle Safety  Weight   Food choices  Oral Health  Other    YOUR PERSONALIZED HEALTH PLAN : 1. Schedule your next subsequent Medicare Wellness visit in one year 2. Attend all of your regular appointments to address your medical issues 3. Complete the preventative screenings and services   Annual Wellness Visit   Medicare Covered Preventative Screenings and [place] Men and Women Who How Often Need? Date of Last Service Action  Abdominal Aortic Aneurysm Adults with AAA risk factors Once     Alcohol Misuse and Counseling All Adults Screening once a year if no alcohol misuse. Counseling up to 4 face to face sessions.     Bone Density Measurement  Adults at risk for osteoporosis Once every 2 yrs     Lipid Panel Z13.6 All adults without CV disease Once every 5 yrs     Colorectal Cancer   Stool sample or  Colonoscopy All adults 26 and older   Once every year  Every 10 years     Depression All Adults Once a year x Today   Diabetes Screening Blood glucose, post glucose load, or GTT Z13.1  All adults at risk  Pre-diabetics  Once per year  Twice per year     Diabetes  Self-Management Training All adults Diabetics 10 hrs first year; 2 hours subsequent years. Requires Copay     Glaucoma  Diabetics  Family history of glaucoma  African Americans 4 yrs +  Hispanic Americans 8 yrs + Annually - requires coppay     Hepatitis C Z72.89 or F19.20  High Risk for HCV  Born between 1945 and 1965  Annually  Once     HIV Z11.4 All adults based on risk  Annually btw ages 31 & 29 regardless of risk  Annually > 65 yrs if at increased risk     Lung Cancer Screening Asymptomatic adults aged  80-77 with 30 pack yr history and current smoker OR quit within the last 15 yrs Annually Must have counseling and shared decision making documentation before first screen     Medical Nutrition Therapy Adults with   Diabetes  Renal disease  Kidney transplant within past 3 yrs 3 hours first year; 2 hours subsequent years     Obesity and Counseling All adults Screening once a year Counseling if BMI 30 or higher  Today   Tobacco Use Counseling Adults who use tobacco  Up to 8 visits in one year x    Vaccines Z23  Hepatitis B  Influenza   Pneumonia  Adults   Once  Once every flu season  Two different vaccines separated by one year     Next Annual Wellness Visit People with Medicare Every year  Today     Services & Screenings Women Who How Often Need  Date of Last Service Action  Mammogram  Z12.31 Women over 62 One baseline ages 88-39. Annually ager 40 yrs+     Pap tests All women Annually if high risk. Every 2 yrs for normal risk women     Screening for cervical cancer with   Pap (Z01.419 nl or Z01.411abnl) &  HPV Z11.51 Women aged 51 to 43 Once every 5 yrs     Screening pelvic and breast exams All women Annually if high risk. Every 2 yrs for normal risk women     Sexually Transmitted Diseases  Chlamydia  Gonorrhea  Syphilis All at risk adults Annually for non pregnant females at increased risk         [place] Men Who How Ofter Need  Date of Last Service Action  Prostate Cancer - DRE & PSA Men over 50 Annually.  DRE might require a copay.     Sexually Transmitted Diseases  Syphilis All at risk adults Annually for men at increased risk

## 2020-03-24 NOTE — Progress Notes (Signed)
Psychiatric Initial Adult Assessment   Patient Identification: Mia Underwood MRN:  [id number] Date of Evaluation:  03/24/2020 Referral Source: Eustaquio Maize [place] Chief Complaint: Clinical depression Today the patient is doing well.  She is very stable.  She denies daily depression.  She denies anhedonia.  She lives with her granddaughter.  She has a good relationship with her.  Patient's health is very good at this time.  She takes her medicines as prescribed.  She denies use of alcohol or drugs.  Patient is positive and optimistic she is lucid and clear.  She denies chest pain shortness of breath or any neurological symptoms.  She has had to her vaccines.  She denies any significant anxiety at this time.  She is functioning extremely well.   Past Psychiatric History:   Previous Psychotropic Medications: Yes   Substance Abuse History in the last 12 months:  Yes.    Consequences of Substance Abuse:   Past Medical History:  Past Medical History:  Diagnosis Date  . Allergic rhinitis 05/04/2016  . Bilateral carpal tunnel syndrome 10/18/2010   Left > Right   . Colles' fracture of left radius 07/01/2019  . Diverticulosis of sigmoid colon 06/07/2010  . Essential hypertension 09/14/2009  . Hyperlipidemia 09/14/2009  . 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 Major depression, recurrent, chronic ([place]) 09/14/2009  . Marijuana abuse 04/04/2016  . Tobacco abuse 01/17/2016  . Upper respiratory infection, viral 02/03/2019  . Vitamin D deficiency 04/04/2016    Past Surgical History:  Procedure Laterality Date  . ABDOMINAL HYSTERECTOMY  April 1996   For fibroids    Family Psychiatric History:   Family History:  Family History  Problem Relation Age of Onset  . Breast cancer Mother   . Depression Mother   . Hypertension Mother   . Dementia Mother   . Cancer Mother        Breast  . Alcoholism Father        Died in his 38's related to alcohol abuse  . Leukemia Sister 8  . Cancer  Sister        leukemia  . Early death Brother        Died in a motor vehicle accident  . Heart attack Daughter 90       Per patient report, specifics unknown  . Diabetes Mellitus II Maternal Grandmother   . Diabetes Mellitus II Paternal Grandmother   . Healthy Sister   . Healthy Sister   . Arthritis Brother   . Healthy Brother   . Healthy Brother   . Healthy Son   . Breast cancer Maternal Aunt   . Breast cancer Cousin   . Breast cancer Maternal Aunt     Social History:   Social History   Socioeconomic History  . Marital status: Single    Spouse name: Not on file  . Number of children: 2  . Years of education: 12 grade  . Highest education level: Not on file  Occupational History  . Occupation: Leisure centre manager: [employer]    Comment: Retired  Tobacco Use  . Smoking status: Current Some Day Smoker    Packs/day: 0.20    Types: Cigarettes    Start date: 12/02/1973  . Smokeless tobacco: Never Used  . Tobacco comment: Stopped in 1980 restarted in 1985. 1pack every 8 days.  Stopped on 01/20/2016. Restarted again. Now 1 pack per week  Substance and Sexual Activity  . Alcohol use: Yes  Comment: 0-3 drinks per week (beer or wine)  . Drug use: No  . Sexual activity: Not Currently  Other Topics Concern  . Not on file  Social History Narrative   Graduated from [employer] in [place].  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      Current Social History 02/10/2019        Patient lives with granddaughter in an apartment on the first floor. There are not steps up to the entrance the patient uses.       Patient's method of transportation is personal car.      The highest level of education was high school diploma.      The patient currently retired.      Identified important Relationships are "My brother, my son, my sister Shirlean Mylar"       Pets : None       Interests / Fun: Love to read, play solitaire, backgammon, small get-togethers with sisters        Current Stressors: Industrial/product designer, Geophysical data processor father, oldest granddaughter and her baby      Religious / Personal Beliefs: Baptized [employer]       Other: "I love junk food"    Social Determinants of Radio broadcast assistant Strain:   . Difficulty of Paying Living Expenses:   Food Insecurity:   . Worried About Charity fundraiser in the Last Year:   . Arboriculturist in the Last Year:   Transportation Needs:   . Film/video editor (Medical):   Marland Kitchen Lack of Transportation (Non-Medical):   Physical Activity:   . Days of Exercise per Week:   . Minutes of Exercise per Session:   Stress:   . Feeling of Stress :   Social Connections:   . Frequency of Communication with Friends and Family:   . Frequency of Social Gatherings with Friends and Family:   . Attends Religious Services:   . Active Member of Clubs or Organizations:   . Attends Archivist Meetings:   Marland Kitchen Marital Status:     Additional Social History:   Allergies:   Allergies  Allergen Reactions  . Penicillins Hives  . Naproxen Rash    Metabolic Disorder Labs: No results found for: HGBA1C, MPG No results found for: PROLACTIN Lab Results  Component Value Date   CHOL 200 (H) 12/16/2019   TRIG 146 12/16/2019   HDL 64 12/16/2019   CHOLHDL 3.1 12/16/2019   VLDL 20 11/01/2010   LDLCALC 111 (H) 12/16/2019   LDLCALC 84 08/03/2016     Current Medications: Current Outpatient Medications  Medication Sig Dispense Refill  . acetaminophen (TYLENOL) 325 MG tablet Take 2 tablets (650 mg total) by mouth every 6 (six) hours as needed for moderate pain. 56 tablet 0  . amLODipine (NORVASC) 10 MG tablet Take 1 tablet (10 mg total) by mouth daily. 90 tablet 3  . aspirin EC 81 MG tablet Take 1 tablet (81 mg total) by mouth daily. (Patient not taking: Reported on 02/10/2019) 90 tablet 3  . calcium carbonate (OS-CAL) 600 MG TABS tablet Take 1 tablet (600 mg total) by mouth 2 (two) times daily with a  meal. 180 tablet 3  . cetirizine (ZYRTEC) 10 MG tablet Take 1 tablet (10 mg total) by mouth at bedtime. 90 tablet 3  . cholecalciferol (VITAMIN D3) 25 MCG (1000 UNIT) tablet Take 1 tablet (1,000 Units total) by mouth daily. 90 tablet  1  . diclofenac Sodium (VOLTAREN) 1 % GEL Apply 4 g topically 4 (four) times daily. 150 g 3  . gabapentin (NEURONTIN) 600 MG tablet Take 1 tablet (600 mg total) by mouth 2 (two) times daily. 180 tablet 3  . lisinopril-hydrochlorothiazide (ZESTORETIC) 20-25 MG tablet Take 2 tablets by mouth daily. 180 tablet 3  . sertraline (ZOLOFT) 100 MG tablet 1  qam 60 tablet 5  . simvastatin (ZOCOR) 20 MG tablet Take 1 tablet (20 mg total) by mouth daily. 90 tablet 3  . traZODone (DESYREL) 50 MG tablet TAKE 2 TABLETS BY MOUTH EVERY NIGHT AT BEDTIME 60 tablet 5   No current facility-administered medications for this visit.    Neurologic: Headache: No Seizure: No Paresthesias:No  Musculoskeletal: Strength & Muscle Tone: within normal limits Gait & Station: normal Patient leans: N/A  Psychiatric Specialty Exam: ROS  There were no vitals taken for this visit.There is no height or weight on file to calculate BMI.  General Appearance: Casual  Eye Contact:  Fair  Speech:  Clear and Coherent  Volume:  Normal  Mood:  Depressed  Affect:  Blunt  Thought Process:  Goal Directed  Orientation:  Full (Time, Place, and Person)  Thought Content:  WDL  Suicidal Thoughts:  No  Homicidal Thoughts:  No  Memory:  NA  Judgement:  Good  Insight:  Good  Psychomotor Activity:  Normal  Concentration:    Recall:  [place] of Knowledge:Good  Language: Poor  Akathisia:  No  Handed:  Right  AIMS (if indicated):    Assets:  Desire for Improvement  ADL's:  Intact  Cognition: WNL  Sleep:      Treatment Plan Summary: 4/28/20212:48 PM   This patient is #1 problem is that of major depression.  She takes Zoloft 100 mg every morning.  She is having no side effects from it.  All  her psychiatric depression symptoms are resolved at this time.  Her second problem is episodic insomnia.  At that time she will take trazodone on a as needed basis.  She does well with this.  Patient be reevaluated in 7 months.

## 2020-06-02 ENCOUNTER — Other Ambulatory Visit: Payer: Self-pay | Admitting: Internal Medicine

## 2020-06-02 DIAGNOSIS — E7800 Pure hypercholesterolemia, unspecified: Secondary | ICD-10-CM

## 2020-06-14 ENCOUNTER — Encounter: Payer: Self-pay | Admitting: Gastroenterology

## 2020-07-12 ENCOUNTER — Other Ambulatory Visit: Payer: Self-pay

## 2020-07-12 DIAGNOSIS — R252 Cramp and spasm: Secondary | ICD-10-CM

## 2020-07-12 DIAGNOSIS — J302 Other seasonal allergic rhinitis: Secondary | ICD-10-CM

## 2020-07-12 MED ORDER — GABAPENTIN 600 MG PO TABS: 600.0000 mg | ORAL_TABLET | Freq: Two times a day (BID) | ORAL | 3 refills | Status: DC

## 2020-07-12 MED ORDER — CETIRIZINE HCL 10 MG PO TABS: 10.0000 mg | ORAL_TABLET | Freq: Every day | ORAL | 3 refills | Status: DC

## 2020-07-12 NOTE — Telephone Encounter (Signed)
cetirizine (ZYRTEC) 10 MG tablet  gabapentin (NEURONTIN) 600 MG tablet, REFILL REQUEST @  [id number] [address] [place], [place] AT [place] Phone:  [phone]  Fax:  [phone]

## 2020-07-14 ENCOUNTER — Other Ambulatory Visit: Payer: Self-pay | Admitting: Internal Medicine

## 2020-07-14 DIAGNOSIS — R252 Cramp and spasm: Secondary | ICD-10-CM

## 2020-07-20 ENCOUNTER — Other Ambulatory Visit: Payer: Self-pay | Admitting: Internal Medicine

## 2020-07-20 DIAGNOSIS — Z1231 Encounter for screening mammogram for malignant neoplasm of breast: Secondary | ICD-10-CM

## 2020-08-03 ENCOUNTER — Encounter: Payer: Medicare HMO | Admitting: Internal Medicine

## 2020-08-05 ENCOUNTER — Ambulatory Visit: Payer: Medicare HMO

## 2020-10-26 ENCOUNTER — Ambulatory Visit (INDEPENDENT_AMBULATORY_CARE_PROVIDER_SITE_OTHER): Payer: Medicare HMO | Admitting: Internal Medicine

## 2020-10-26 VITALS — BP 138/69 | HR 98 | Temp 98.9°F | Ht 61.0 in | Wt 147.1 lb

## 2020-10-26 DIAGNOSIS — Z0000 Encounter for general adult medical examination without abnormal findings: Secondary | ICD-10-CM

## 2020-10-26 DIAGNOSIS — E559 Vitamin D deficiency, unspecified: Secondary | ICD-10-CM

## 2020-10-26 DIAGNOSIS — F339 Major depressive disorder, recurrent, unspecified: Secondary | ICD-10-CM

## 2020-10-26 DIAGNOSIS — K5730 Diverticulosis of large intestine without perforation or abscess without bleeding: Secondary | ICD-10-CM

## 2020-10-26 DIAGNOSIS — E7800 Pure hypercholesterolemia, unspecified: Secondary | ICD-10-CM

## 2020-10-26 DIAGNOSIS — M62838 Other muscle spasm: Secondary | ICD-10-CM

## 2020-10-26 DIAGNOSIS — I10 Essential (primary) hypertension: Secondary | ICD-10-CM

## 2020-10-26 DIAGNOSIS — Z23 Encounter for immunization: Secondary | ICD-10-CM | POA: Diagnosis not present

## 2020-10-26 DIAGNOSIS — G5792 Unspecified mononeuropathy of left lower limb: Secondary | ICD-10-CM

## 2020-10-26 NOTE — Assessment & Plan Note (Signed)
-  Patient has a history of diverticulosis of sigmoid colon -She is due for repeat colonoscopy and I put in referral to GI today for this

## 2020-10-26 NOTE — Assessment & Plan Note (Signed)
-  Patient had intermittent left shoulder pain which is likely musculoskeletal in nature -This is since resolved -We will continue with Voltaren gel as needed -No further work-up at this time

## 2020-10-26 NOTE — Assessment & Plan Note (Signed)
-  Patient still complains of intermittent left lower extremity numbness which has been ongoing for several years -She states that this is currently doing "okay" -She is unsure if the gabapentin is helping but takes it as needed -I think the patient that if this persists and gabapentin is not relieving her symptoms there is still room to increase the dose -Patient states that she will try the gabapentin and let us know if it is helping or if we need to increase the dose

## 2020-10-26 NOTE — Assessment & Plan Note (Signed)
-  This problem is chronic and stable -Patient states that she is compliant with simvastatin 20 mg daily -We will recheck her lipid panel at her follow-up visit

## 2020-10-26 NOTE — Progress Notes (Signed)
   Subjective:    Patient ID: Mia Underwood, female    DOB: 1950/12/18, 69 y.o.   MRN: [id number]  HPI    Review of Systems     Objective:   Physical Exam        Assessment & Plan:

## 2020-10-26 NOTE — Assessment & Plan Note (Signed)
BP Readings from Last 3 Encounters:  10/26/20 138/69  03/16/20 (!) 189/82  12/16/19 (!) 145/81    Lab Results  Component Value Date   NA 143 12/16/2019   K 3.9 12/16/2019   CREATININE 0.72 12/16/2019    Assessment: Blood pressure control:  Well-controlled Progress toward BP goal:   At goal Comments: Patient is compliant with lisinopril/HCTZ 20/25 mg 2 tablets daily  Plan: Medications:  continue current medications Educational resources provided:   Self management tools provided:   Other plans: We will check BMP at next visit

## 2020-10-26 NOTE — Assessment & Plan Note (Signed)
-  This problem is chronic and stable -Patient given vitamin D supplement and states that she is compliant with this -We will recheck her vitamin D level at her next visit

## 2020-10-26 NOTE — Patient Instructions (Signed)
-  Was a pleasure seeing you today -How great holiday season! -We will give you a flu shot today -We will check some blood work on your and give you your tetanus booster at your next visit -I have put in referral to the gastroenterologist for a repeat colonoscopy -Please call me if you have any questions or concerns or if you need any refills on medication

## 2020-10-26 NOTE — Assessment & Plan Note (Signed)
-  Patient follows up with Dr. Casimiro Needle as an outpatient for this -Patient PHQ-9 score is 1 -We will continue with sertraline 100 mg daily -No further work-up at this time

## 2020-10-26 NOTE — Assessment & Plan Note (Signed)
-  Patient is due for colonoscopy and I have put in referral to GI today for this -Flu shot given today -Patient will get a Tdap at her follow-up visit -Patient got her Covid 19 booster vaccine 2 weeks ago and we will make a copy of her vaccination card for our records

## 2020-10-27 ENCOUNTER — Other Ambulatory Visit: Payer: Self-pay

## 2020-10-27 ENCOUNTER — Ambulatory Visit (HOSPITAL_COMMUNITY): Payer: Medicare HMO | Admitting: Psychiatry

## 2020-11-03 ENCOUNTER — Encounter: Payer: Self-pay | Admitting: *Deleted

## 2020-11-03 NOTE — Progress Notes (Unsigned)

## 2020-11-16 NOTE — Progress Notes (Unsigned)
Things That May Be Affecting Your Health:  Alcohol  Hearing loss  Pain   x Depression  Home Safety  Sexual Health   Diabetes x Lack of physical activity  Stress   Difficulty with daily activities  Loneliness  Tiredness   Drug use  Medicines x Tobacco use   Falls  Motor Vehicle Safety  Weight   Food choices  Oral Health  Other    YOUR PERSONALIZED HEALTH PLAN : 1. Schedule your next subsequent Medicare Wellness visit in one year 2. Attend all of your regular appointments to address your medical issues 3. Complete the preventative screenings and services   Annual Wellness Visit   Medicare Covered Preventative Screenings and [place] Men and Women Who How Often Need? Date of Last Service Action  Abdominal Aortic Aneurysm Adults with AAA risk factors Once     Alcohol Misuse and Counseling All Adults Screening once a year if no alcohol misuse. Counseling up to 4 face to face sessions.     Bone Density Measurement  Adults at risk for osteoporosis Once every 2 yrs     Lipid Panel Z13.6 All adults without CV disease Once every 5 yrs     Colorectal Cancer   Stool sample or  Colonoscopy All adults 61 and older   Once every year  Every 10 years x 07/18/2010   Depression All Adults Once a year  Today   Diabetes Screening Blood glucose, post glucose load, or GTT Z13.1  All adults at risk  Pre-diabetics  Once per year  Twice per year     Diabetes  Self-Management Training All adults Diabetics 10 hrs first year; 2 hours subsequent years. Requires Copay     Glaucoma  Diabetics  Family history of glaucoma  African Americans 20 yrs +  Hispanic Americans 16 yrs + Annually - requires coppay     Hepatitis C Z72.89 or F19.20  High Risk for HCV  Born between 1945 and 1965  Annually  Once     HIV Z11.4 All adults based on risk  Annually btw ages 1 & 88 regardless of risk  Annually > 65 yrs if at increased risk     Lung Cancer Screening Asymptomatic  adults aged 50-77 with 30 pack yr history and current smoker OR quit within the last 15 yrs Annually Must have counseling and shared decision making documentation before first screen     Medical Nutrition Therapy Adults with   Diabetes  Renal disease  Kidney transplant within past 3 yrs 3 hours first year; 2 hours subsequent years     Obesity and Counseling All adults Screening once a year Counseling if BMI 30 or higher  Today   Tobacco Use Counseling Adults who use tobacco  Up to 8 visits in one year     Vaccines Z23  Hepatitis B  Influenza   Pneumonia  Adults   Once  Once every flu season  Two different vaccines separated by one year x Tdap Covid booster    Next Annual Wellness Visit People with Medicare Every year  Today     Services & Screenings Women Who How Often Need  Date of Last Service Action  Mammogram  Z12.31 Women over 68 One baseline ages 48-39. Annually ager 40 yrs+     Pap tests All women Annually if high risk. Every 2 yrs for normal risk women     Screening for cervical cancer with   Pap (Z01.419 nl or  Z01.411abnl) &  HPV Z11.51 Women aged 75 to 46 Once every 5 yrs     Screening pelvic and breast exams All women Annually if high risk. Every 2 yrs for normal risk women     Sexually Transmitted Diseases  Chlamydia  Gonorrhea  Syphilis All at risk adults Annually for non pregnant females at increased risk         [place] Men Who How Ofter Need  Date of Last Service Action  Prostate Cancer - DRE & PSA Men over 50 Annually.  DRE might require a copay.     Sexually Transmitted Diseases  Syphilis All at risk adults Annually for men at increased risk

## 2021-01-05 ENCOUNTER — Ambulatory Visit (AMBULATORY_SURGERY_CENTER): Payer: Medicare HMO | Admitting: *Deleted

## 2021-01-05 ENCOUNTER — Telehealth: Payer: Self-pay | Admitting: *Deleted

## 2021-01-05 ENCOUNTER — Other Ambulatory Visit: Payer: Self-pay

## 2021-01-05 VITALS — Ht 61.0 in | Wt 148.0 lb

## 2021-01-05 DIAGNOSIS — Z1211 Encounter for screening for malignant neoplasm of colon: Secondary | ICD-10-CM

## 2021-01-05 MED ORDER — NA SULFATE-K SULFATE-MG SULF 17.5-3.13-1.6 GM/177ML PO SOLN: ORAL | 0 refills | Status: DC

## 2021-01-05 NOTE — Progress Notes (Signed)
Patient's pre-visit was done today over the phone with the patient due to COVID-19 pandemic. Name,DOB and address verified. Insurance verified. Patient denies any allergies to Eggs and Soy. Patient denies any problems with anesthesia/sedation. Patient denies taking diet pills or blood thinners. Packet of Prep instructions mailed to patient including a copy of a consent form -pt is aware.  Patient understands to call us back with any questions or concerns. COVID-19 vaccines completed x3, per patient. Patient is aware of our care-partner policy and ONGEX-52 safety protocol.

## 2021-01-05 NOTE — Telephone Encounter (Signed)
PV was completed today with the patient.

## 2021-01-05 NOTE — Telephone Encounter (Signed)
Patient had a PV over the phone at 1:00 pm. I called both numbers listed x2 starting at 1256 pm. No answer on any try. I left a message on her home humber for the patient to call us back. I will try again later.

## 2021-01-17 ENCOUNTER — Encounter: Payer: Self-pay | Admitting: Gastroenterology

## 2021-01-19 ENCOUNTER — Encounter: Payer: Self-pay | Admitting: Gastroenterology

## 2021-01-19 ENCOUNTER — Ambulatory Visit (AMBULATORY_SURGERY_CENTER): Payer: Medicare HMO | Admitting: Gastroenterology

## 2021-01-19 ENCOUNTER — Other Ambulatory Visit: Payer: Self-pay

## 2021-01-19 VITALS — BP 147/78 | HR 59 | Temp 98.1°F | Resp 20 | Ht 61.0 in | Wt 148.0 lb

## 2021-01-19 DIAGNOSIS — Z1211 Encounter for screening for malignant neoplasm of colon: Secondary | ICD-10-CM

## 2021-01-19 DIAGNOSIS — K635 Polyp of colon: Secondary | ICD-10-CM | POA: Diagnosis not present

## 2021-01-19 DIAGNOSIS — D120 Benign neoplasm of cecum: Secondary | ICD-10-CM

## 2021-01-19 DIAGNOSIS — D123 Benign neoplasm of transverse colon: Secondary | ICD-10-CM

## 2021-01-19 MED ORDER — SODIUM CHLORIDE 0.9 % IV SOLN: 500.0000 mL | Freq: Once | INTRAVENOUS | Status: DC

## 2021-01-19 NOTE — Progress Notes (Signed)
PT taken to PACU. Monitors in place. VSS. Report given to RN. 

## 2021-01-19 NOTE — Patient Instructions (Signed)

## 2021-01-19 NOTE — Progress Notes (Signed)
Called to room to assist during endoscopic procedure.  Patient ID and intended procedure confirmed with present staff. Received instructions for my participation in the procedure from the performing physician.  

## 2021-01-19 NOTE — Progress Notes (Signed)
VS by CW. ?

## 2021-01-19 NOTE — Op Note (Signed)
[place] Patient Name: Mia Underwood Procedure Date: 01/19/2021 1:23 PM MRN: [id number] Endoscopist: Mauri Pole , MD Age: 70 Referring MD:  Date of Birth: [birth year] Gender: Female Account #: [id number] Procedure:                Colonoscopy Indications:              Screening for colorectal malignant neoplasm Medicines:                Monitored Anesthesia Care Procedure:                Pre-Anesthesia Assessment:                           - Prior to the procedure, a History and Physical                            was performed, and patient medications and                            allergies were reviewed. The patient's tolerance of                            previous anesthesia was also reviewed. The risks                            and benefits of the procedure and the sedation                            options and risks were discussed with the patient.                            All questions were answered, and informed consent                            was obtained. Prior Anticoagulants: The patient has                            taken no previous anticoagulant or antiplatelet                            agents. ASA Grade Assessment: III - A patient with                            severe systemic disease. After reviewing the risks                            and benefits, the patient was deemed in                            satisfactory condition to undergo the procedure.                           After obtaining informed consent, the colonoscope  was passed under direct vision. Throughout the                            procedure, the patient's blood pressure, pulse, and                            oxygen saturations were monitored continuously. The                            Olympus PCF-H190DL [id number]) Colonoscope was                            introduced through the anus and advanced to the the                            cecum,  identified by appendiceal orifice and                            ileocecal valve. The colonoscopy was performed                            without difficulty. The patient tolerated the                            procedure well. The quality of the bowel                            preparation was adequate. The ileocecal valve,                            appendiceal orifice, and rectum were photographed. Scope In: 1:37:40 PM Scope Out: 1:49:34 PM Scope Withdrawal Time: 0 hours 8 minutes 42 seconds  Total Procedure Duration: 0 hours 11 minutes 54 seconds  Findings:                 The perianal and digital rectal examinations were                            normal.                           Two sessile polyps were found in the transverse                            colon and cecum. The polyps were 5 to 7 mm in size.                            These polyps were removed with a cold snare.                            Resection and retrieval were complete.                           Scattered small and large-mouthed diverticula were  found in the sigmoid colon and descending colon.                           Non-bleeding external and internal hemorrhoids were                            found during retroflexion. The hemorrhoids were                            medium-sized. Complications:            No immediate complications. Estimated Blood Loss:     Estimated blood loss was minimal. Impression:               - Two 5 to 7 mm polyps in the transverse colon and                            in the cecum, removed with a cold snare. Resected                            and retrieved.                           - Diverticulosis in the sigmoid colon and in the                            descending colon.                           - Non-bleeding external and internal hemorrhoids. Recommendation:           - Patient has a contact number available for                             emergencies. The signs and symptoms of potential                            delayed complications were discussed with the                            patient. Return to normal activities tomorrow.                            Written discharge instructions were provided to the                            patient.                           - Resume previous diet.                           - Continue present medications.                           - Await pathology results.                           -  Repeat colonoscopy in 5-10 years for surveillance                            based on pathology results. Mauri Pole, MD 01/19/2021 1:57:12 PM This report has been signed electronically.

## 2021-01-21 ENCOUNTER — Telehealth: Payer: Self-pay

## 2021-01-21 NOTE — Telephone Encounter (Signed)
  Follow up Call-  Call back number 01/19/2021  Post procedure Call Back phone  # [phone]  Permission to leave phone message Yes  Some recent data might be hidden     Patient questions:  Do you have a fever, pain , or abdominal swelling? No. Pain Score  0 *  Have you tolerated food without any problems? Yes.    Have you been able to return to your normal activities? Yes.    Do you have any questions about your discharge instructions: Diet   No. Medications  No. Follow up visit  No.  Do you have questions or concerns about your Care? No.  Actions: * If pain score is 4 or above: No action needed, pain <4.   1. Have you developed a fever since your procedure? No   2.   Have you had an respiratory symptoms (SOB or cough) since your procedure? No   3.   Have you tested positive for COVID 19 since your procedure? No   4.   Have you had any family members/close contacts diagnosed with the COVID 19 since your procedure?  No    If yes to any of these questions please route to Joylene John, RN and Joella Prince, RN

## 2021-01-24 DIAGNOSIS — R252 Cramp and spasm: Secondary | ICD-10-CM | POA: Diagnosis not present

## 2021-01-24 DIAGNOSIS — Z803 Family history of malignant neoplasm of breast: Secondary | ICD-10-CM | POA: Diagnosis not present

## 2021-01-24 DIAGNOSIS — Z720 Tobacco use: Secondary | ICD-10-CM | POA: Diagnosis not present

## 2021-01-24 DIAGNOSIS — Z8249 Family history of ischemic heart disease and other diseases of the circulatory system: Secondary | ICD-10-CM | POA: Diagnosis not present

## 2021-01-24 DIAGNOSIS — G4700 Insomnia, unspecified: Secondary | ICD-10-CM | POA: Diagnosis not present

## 2021-01-24 DIAGNOSIS — E785 Hyperlipidemia, unspecified: Secondary | ICD-10-CM | POA: Diagnosis not present

## 2021-01-24 DIAGNOSIS — R69 Illness, unspecified: Secondary | ICD-10-CM | POA: Diagnosis not present

## 2021-01-24 DIAGNOSIS — I10 Essential (primary) hypertension: Secondary | ICD-10-CM | POA: Diagnosis not present

## 2021-01-24 DIAGNOSIS — G629 Polyneuropathy, unspecified: Secondary | ICD-10-CM | POA: Diagnosis not present

## 2021-01-24 DIAGNOSIS — J309 Allergic rhinitis, unspecified: Secondary | ICD-10-CM | POA: Diagnosis not present

## 2021-02-01 ENCOUNTER — Encounter: Payer: Self-pay | Admitting: Gastroenterology

## 2021-02-18 ENCOUNTER — Other Ambulatory Visit: Payer: Self-pay

## 2021-02-18 ENCOUNTER — Ambulatory Visit: Payer: Medicare HMO | Admitting: Student

## 2021-03-01 ENCOUNTER — Ambulatory Visit (INDEPENDENT_AMBULATORY_CARE_PROVIDER_SITE_OTHER): Payer: Medicare HMO | Admitting: Internal Medicine

## 2021-03-01 ENCOUNTER — Other Ambulatory Visit: Payer: Self-pay

## 2021-03-01 ENCOUNTER — Encounter: Payer: Self-pay | Admitting: Internal Medicine

## 2021-03-01 VITALS — BP 140/72 | HR 83 | Ht 61.0 in | Wt 148.0 lb

## 2021-03-01 DIAGNOSIS — Z0000 Encounter for general adult medical examination without abnormal findings: Secondary | ICD-10-CM

## 2021-03-01 DIAGNOSIS — M62838 Other muscle spasm: Secondary | ICD-10-CM

## 2021-03-01 MED ORDER — DICLOFENAC SODIUM 1 % EX GEL: 4.0000 g | Freq: Four times a day (QID) | CUTANEOUS | 3 refills | Status: DC

## 2021-03-01 NOTE — Patient Instructions (Addendum)
All Notes   Progress Notes by Aldine Contes, MD at 11/03/2020 3:07 PM  Author: Aldine Contes, MD Author Type: Physician Filed: 11/16/2020 10:15 AM  Note Status: Sign when Signing Visit Cosign: Cosign Not Required Encounter Date: 11/03/2020  Editor: Aldine Contes, MD (Physician)             Things That May Be Affecting Your Health:  Alcohol  Hearing loss  Pain   x Depression  Home Safety  Sexual Health   Diabetes x Lack of physical activity  Stress   Difficulty with daily activities  Loneliness  Tiredness   Drug use  Medicines x Tobacco use   Falls  Motor Vehicle Safety  Weight   Food choices  Oral Health  Other    YOUR PERSONALIZED HEALTH PLAN : 1. Schedule your next subsequent Medicare Wellness visit in one year 2. Attend all of your regular appointments to address your medical issues 3. Complete the preventative screenings and services 4.  Please keep your next appointment with Dr. Dareen Piano on 03/22/2021 @ 1015 6.  Please call [facility] @ [phone] and schedule your Mammogram 7.  Please call your pharmacy and schedule your TDaP vaccine 8.  Begin seated and standing exercises with exercise band to increase strength and balance for 20 minutes, 3 times a week.    Annual Wellness Visit                       Medicare Covered Preventative Screenings and Services  Services & Screenings Men and Women Who How Often Need? Date of Last Service Action  Abdominal Aortic Aneurysm Adults with AAA risk factors Once     Alcohol Misuse and Counseling All Adults Screening once a year if no alcohol misuse. Counseling up to 4 face to face sessions.     Bone Density Measurement  Adults at risk for osteoporosis Once every 2 yrs     Lipid Panel Z13.6 All adults without CV disease Once every 5 yrs     Colorectal Cancer   Stool sample or  Colonoscopy All adults 36 and older   Once every year  Every 10 years x 07/18/2010   Depression  All Adults Once a year  Today   Diabetes Screening Blood glucose, post glucose load, or GTT Z13.1  All adults at risk  Pre-diabetics  Once per year  Twice per year     Diabetes  Self-Management Training All adults Diabetics 10 hrs first year; 2 hours subsequent years. Requires Copay     Glaucoma  Diabetics  Family history of glaucoma  African Americans 83 yrs +  Hispanic Americans 43 yrs + Annually - requires coppay     Hepatitis C Z72.89 or F19.20  High Risk for HCV  Born between 1945 and 1965  Annually  Once     HIV Z11.4 All adults based on risk  Annually btw ages 46 & 58 regardless of risk  Annually > 65 yrs if at increased risk     Lung Cancer Screening Asymptomatic adults aged 106-77 with 30 pack yr history and current smoker OR quit within the last 15 yrs Annually Must have counseling and shared decision making documentation before first screen     Medical Nutrition Therapy Adults with   Diabetes  Renal disease  Kidney transplant within past 3 yrs 3 hours first year; 2 hours subsequent years     Obesity and Counseling All adults Screening once a year Counseling if BMI  30 or higher  Today   Tobacco Use Counseling Adults who use tobacco  Up to 8 visits in one year     Vaccines Z23  Hepatitis B  Influenza   Pneumonia  Adults   Once  Once every flu season  Two different vaccines separated by one year x Tdap Covid booster    Next Annual Wellness Visit People with Medicare Every year  Today     [place] Women Who How Often Need  Date of Last Service Action  Mammogram  Z12.31 Women over 19 One baseline ages 24-39. Annually ager 40 yrs+     Pap tests All women Annually if high risk. Every 2 yrs for normal risk women     Screening for cervical cancer with   Pap (Z01.419 nl or Z01.411abnl) &  HPV Z11.51 Women aged 65 to 21 Once every 5 yrs     Screening pelvic and breast exams All women  Annually if high risk. Every 2 yrs for normal risk women     Sexually Transmitted Diseases  Chlamydia  Gonorrhea  Syphilis All at risk adults Annually for non pregnant females at increased risk         [place] Men Who How Ofter Need  Date of Last Service Action  Prostate Cancer - DRE & PSA Men over 50 Annually.  DRE might require a copay.     Sexually Transmitted Diseases  Syphilis All at risk adults Annually for men at increased risk                Fall Prevention in the Home, Adult Falls can cause injuries and can happen to people of all ages. There are many things you can do to make your home safe and to help prevent falls. Ask for help when making these changes. What actions can I take to prevent falls? General Instructions  Use good lighting in all rooms. Replace any light bulbs that burn out.  Turn on the lights in dark areas. Use night-lights.  Keep items that you use often in easy-to-reach places. Lower the shelves around your home if needed.  Set up your furniture so you have a clear path. Avoid moving your furniture around.  Do not have throw rugs or other things on the floor that can make you trip.  Avoid walking on wet floors.  If any of your floors are uneven, fix them.  Add color or contrast paint or tape to clearly mark and help you see: ? Grab bars or handrails. ? First and last steps of staircases. ? Where the edge of each step is.  If you use a stepladder: ? Make sure that it is fully opened. Do not climb a closed stepladder. ? Make sure the sides of the stepladder are locked in place. ? Ask someone to hold the stepladder while you use it.  Know where your pets are when moving through your home. What can I do in the bathroom?  Keep the floor dry. Clean up any water on the floor right away.  Remove soap buildup in the tub or shower.  Use nonskid mats or decals on the floor of the tub or  shower.  Attach bath mats securely with double-sided, nonslip rug tape.  If you need to sit down in the shower, use a plastic, nonslip stool.  Install grab bars by the toilet and in the tub and shower. Do not use towel bars as grab bars.  What can I do in the bedroom?  Make sure that you have a light by your bed that is easy to reach.  Do not use any sheets or blankets for your bed that hang to the floor.  Have a firm chair with side arms that you can use for support when you get dressed. What can I do in the kitchen?  Clean up any spills right away.  If you need to reach something above you, use a step stool with a grab bar.  Keep electrical cords out of the way.  Do not use floor polish or wax that makes floors slippery. What can I do with my stairs?  Do not leave any items on the stairs.  Make sure that you have a light switch at the top and the bottom of the stairs.  Make sure that there are handrails on both sides of the stairs. Fix handrails that are broken or loose.  Install nonslip stair treads on all your stairs.  Avoid having throw rugs at the top or bottom of the stairs.  Choose a carpet that does not hide the edge of the steps on the stairs.  Check carpeting to make sure that it is firmly attached to the stairs. Fix carpet that is loose or worn. What can I do on the outside of my home?  Use bright outdoor lighting.  Fix the edges of walkways and driveways and fix any cracks.  Remove anything that might make you trip as you walk through a door, such as a raised step or threshold.  Trim any bushes or trees on paths to your home.  Check to see if handrails are loose or broken and that both sides of all steps have handrails.  Install guardrails along the edges of any raised decks and porches.  Clear paths of anything that can make you trip, such as tools or rocks.  Have leaves, snow, or ice cleared regularly.  Use sand or salt on paths during  winter.  Clean up any spills in your garage right away. This includes grease or oil spills. What other actions can I take?  Wear shoes that: ? Have a low heel. Do not wear high heels. ? Have rubber bottoms. ? Feel good on your feet and fit well. ? Are closed at the toe. Do not wear open-toe sandals.  Use tools that help you move around if needed. These include: ? Canes. ? Walkers. ? Scooters. ? Crutches.  Review your medicines with your doctor. Some medicines can make you feel dizzy. This can increase your chance of falling. Ask your doctor what else you can do to help prevent falls. Where to find more information  Centers for Disease Control and Prevention, [employer]: http://www.wolf.info/  National Institute on Aging: http://kim-miller.com/ Contact a doctor if:  You are afraid of falling at home.  You feel weak, drowsy, or dizzy at home.  You fall at home. Summary  There are many simple things that you can do to make your home safe and to help prevent falls.  Ways to make your home safe include removing things that can make you trip and installing grab bars in the bathroom.  Ask for help when making these changes in your home. This information is not intended to replace advice given to you by your health care provider. Make sure you discuss any questions you have with your health care provider. Document Revised: 06/16/2020 Document Reviewed: 06/16/2020 Elsevier Patient Education  [employer].  Health Maintenance, Female Adopting a healthy lifestyle and getting preventive care are important in promoting health and wellness. Ask your health care provider about:  The right schedule for you to have regular tests and exams.  Things you can do on your own to prevent diseases and keep yourself healthy. What should I know about diet, weight, and exercise? Eat a healthy diet  Eat a diet that includes plenty of vegetables, fruits, low-fat dairy products, and lean protein.  Do not  eat a lot of foods that are high in solid fats, added sugars, or sodium.   Maintain a healthy weight Body mass index (BMI) is used to identify weight problems. It estimates body fat based on height and weight. Your health care provider can help determine your BMI and help you achieve or maintain a healthy weight. Get regular exercise Get regular exercise. This is one of the most important things you can do for your health. Most adults should:  Exercise for at least 150 minutes each week. The exercise should increase your heart rate and make you sweat (moderate-intensity exercise).  Do strengthening exercises at least twice a week. This is in addition to the moderate-intensity exercise.  Spend less time sitting. Even light physical activity can be beneficial. Watch cholesterol and blood lipids Have your blood tested for lipids and cholesterol at 69 years of age, then have this test every 5 years. Have your cholesterol levels checked more often if:  Your lipid or cholesterol levels are high.  You are older than 69 years of age.  You are at high risk for heart disease. What should I know about cancer screening? Depending on your health history and family history, you may need to have cancer screening at various ages. This may include screening for:  Breast cancer.  Cervical cancer.  Colorectal cancer.  Skin cancer.  Lung cancer. What should I know about heart disease, diabetes, and high blood pressure? Blood pressure and heart disease  High blood pressure causes heart disease and increases the risk of stroke. This is more likely to develop in people who have high blood pressure readings, are of African descent, or are overweight.  Have your blood pressure checked: ? Every 3-5 years if you are 85-71 years of age. ? Every year if you are 59 years old or older. Diabetes Have regular diabetes screenings. This checks your fasting blood sugar level. Have the screening done:  Once  every three years after age 14 if you are at a normal weight and have a low risk for diabetes.  More often and at a younger age if you are overweight or have a high risk for diabetes. What should I know about preventing infection? Hepatitis B If you have a higher risk for hepatitis B, you should be screened for this virus. Talk with your health care provider to find out if you are at risk for hepatitis B infection. Hepatitis C Testing is recommended for:  Everyone born from [birth year] through 1964.  Anyone with known risk factors for hepatitis C. Sexually transmitted infections (STIs)  Get screened for STIs, including gonorrhea and chlamydia, if: ? You are sexually active and are younger than 69 years of age. ? You are older than 69 years of age and your health care provider tells you that you are at risk for this type of infection. ? Your sexual activity has changed since you were last screened, and you are at increased risk for chlamydia or gonorrhea. Ask your health care  provider if you are at risk.  Ask your health care provider about whether you are at high risk for HIV. Your health care provider may recommend a prescription medicine to help prevent HIV infection. If you choose to take medicine to prevent HIV, you should first get tested for HIV. You should then be tested every 3 months for as long as you are taking the medicine. Pregnancy  If you are about to stop having your period (premenopausal) and you may become pregnant, seek counseling before you get pregnant.  Take 400 to 800 micrograms (mcg) of folic acid every day if you become pregnant.  Ask for birth control (contraception) if you want to prevent pregnancy. Osteoporosis and menopause Osteoporosis is a disease in which the bones lose minerals and strength with aging. This can result in bone fractures. If you are 38 years old or older, or if you are at risk for osteoporosis and fractures, ask your health care provider if you  should:  Be screened for bone loss.  Take a calcium or vitamin D supplement to lower your risk of fractures.  Be given hormone replacement therapy (HRT) to treat symptoms of menopause. Follow these instructions at home: Lifestyle  Do not use any products that contain nicotine or tobacco, such as cigarettes, e-cigarettes, and chewing tobacco. If you need help quitting, ask your health care provider.  Do not use street drugs.  Do not share needles.  Ask your health care provider for help if you need support or information about quitting drugs. Alcohol use  Do not drink alcohol if: ? Your health care provider tells you not to drink. ? You are pregnant, may be pregnant, or are planning to become pregnant.  If you drink alcohol: ? Limit how much you use to 0-1 drink a day. ? Limit intake if you are breastfeeding.  Be aware of how much alcohol is in your drink. In the [place] one drink equals one 12 oz bottle of beer (355 mL), one 5 oz glass of wine (148 mL), or one 1 oz glass of hard liquor (44 mL). General instructions  Schedule regular health, dental, and eye exams.  Stay current with your vaccines.  Tell your health care provider if: ? You often feel depressed. ? You have ever been abused or do not feel safe at home. Summary  Adopting a healthy lifestyle and getting preventive care are important in promoting health and wellness.  Follow your health care provider's instructions about healthy diet, exercising, and getting tested or screened for diseases.  Follow your health care provider's instructions on monitoring your cholesterol and blood pressure. This information is not intended to replace advice given to you by your health care provider. Make sure you discuss any questions you have with your health care provider. Document Revised: 11/06/2018 Document Reviewed: 11/06/2018 Elsevier Patient Education  2021 [employer].   Steps to Quit Smoking Smoking tobacco is the  leading cause of preventable death. It can affect almost every organ in the body. Smoking puts you and people around you at risk for many serious, long-lasting (chronic) diseases. Quitting smoking can be hard, but it is one of the best things that you can do for your health. It is never too late to quit. How do I get ready to quit? When you decide to quit smoking, make a plan to help you succeed. Before you quit:  Pick a date to quit. Set a date within the next 2 weeks to give you time  to prepare.  Write down the reasons why you are quitting. Keep this list in places where you will see it often.  Tell your family, friends, and co-workers that you are quitting. Their support is important.  Talk with your doctor about the choices that may help you quit.  Find out if your health insurance will pay for these treatments.  Know the people, places, things, and activities that make you want to smoke (triggers). Avoid them. What first steps can I take to quit smoking?  Throw away all cigarettes at home, at work, and in your car.  Throw away the things that you use when you smoke, such as ashtrays and lighters.  Clean your car. Make sure to empty the ashtray.  Clean your home, including curtains and carpets. What can I do to help me quit smoking? Talk with your doctor about taking medicines and seeing a counselor at the same time. You are more likely to succeed when you do both.  If you are pregnant or breastfeeding, talk with your doctor about counseling or other ways to quit smoking. Do not take medicine to help you quit smoking unless your doctor tells you to do so. To quit smoking: Quit right away  Quit smoking totally, instead of slowly cutting back on how much you smoke over a period of time.  Go to counseling. You are more likely to quit if you go to counseling sessions regularly. Take medicine You may take medicines to help you quit. Some medicines need a prescription, and some you  can buy over-the-counter. Some medicines may contain a drug called nicotine to replace the nicotine in cigarettes. Medicines may:  Help you to stop having the desire to smoke (cravings).  Help to stop the problems that come when you stop smoking (withdrawal symptoms). Your doctor may ask you to use:  Nicotine patches, gum, or lozenges.  Nicotine inhalers or sprays.  Non-nicotine medicine that is taken by mouth. Find resources Find resources and other ways to help you quit smoking and remain smoke-free after you quit. These resources are most helpful when you use them often. They include:  Online chats with a Social worker.  Phone quitlines.  Printed Furniture conservator/restorer.  Support groups or group counseling.  Text messaging programs.  Mobile phone apps. Use apps on your mobile phone or tablet that can help you stick to your quit plan. There are many free apps for mobile phones and tablets as well as websites. Examples include Quit Guide from the [employer] and smokefree.gov   What things can I do to make it easier to quit?  Talk to your family and friends. Ask them to support and encourage you.  Call a phone quitline (1-800-QUIT-NOW), reach out to support groups, or work with a Social worker.  Ask people who smoke to not smoke around you.  Avoid places that make you want to smoke, such as: ? Bars. ? Parties. ? Smoke-break areas at work.  Spend time with people who do not smoke.  Lower the stress in your life. Stress can make you want to smoke. Try these things to help your stress: ? Getting regular exercise. ? Doing deep-breathing exercises. ? Doing yoga. ? Meditating. ? Doing a body scan. To do this, close your eyes, focus on one area of your body at a time from head to toe. Notice which parts of your body are tense. Try to relax the muscles in those areas.   How will I feel when I quit smoking?  Day 1 to 3 weeks Within the first 24 hours, you may start to have some problems that come  from quitting tobacco. These problems are very bad 2-3 days after you quit, but they do not often last for more than 2-3 weeks. You may get these symptoms:  Mood swings.  Feeling restless, nervous, angry, or annoyed.  Trouble concentrating.  Dizziness.  Strong desire for high-sugar foods and nicotine.  Weight gain.  Trouble pooping (constipation).  Feeling like you may vomit (nausea).  Coughing or a sore throat.  Changes in how the medicines that you take for other issues work in your body.  Depression.  Trouble sleeping (insomnia). Week 3 and afterward After the first 2-3 weeks of quitting, you may start to notice more positive results, such as:  Better sense of smell and taste.  Less coughing and sore throat.  Slower heart rate.  Lower blood pressure.  Clearer skin.  Better breathing.  Fewer sick days. Quitting smoking can be hard. Do not give up if you fail the first time. Some people need to try a few times before they succeed. Do your best to stick to your quit plan, and talk with your doctor if you have any questions or concerns. Summary  Smoking tobacco is the leading cause of preventable death. Quitting smoking can be hard, but it is one of the best things that you can do for your health.  When you decide to quit smoking, make a plan to help you succeed.  Quit smoking right away, not slowly over a period of time.  When you start quitting, seek help from your doctor, family, or friends. This information is not intended to replace advice given to you by your health care provider. Make sure you discuss any questions you have with your health care provider. Document Revised: 08/08/2019 Document Reviewed: 02/01/2019 Elsevier Patient Education  [place].

## 2021-03-01 NOTE — Progress Notes (Signed)
This AWV is being conducted by [place] only. The patient was located at home and I was located in [facility]. The patient's identity was confirmed using their DOB and current address. The patient or his/her legal guardian has consented to being evaluated through a telephone encounter and understands the associated risks (an examination cannot be done and the patient may need to come in for an appointment) / benefits (allows the patient to remain at home, decreasing exposure to coronavirus). I personally spent 50 minutes conducting the AWV.  Subjective:   Mia Underwood is a 70 y.o. female who presents for a Medicare Annual Wellness Visit.  The following items have been reviewed and updated today in the appropriate area in the EMR.   Health Risk Assessment  Height, weight, BMI, and BP Visual acuity if needed Depression screen Fall risk / safety level Advance directive discussion Medical and family history were reviewed and updated Updating list of other providers & suppliers Medication reconciliation, including over the counter medicines Cognitive screen Written screening schedule Risk Factor list Personalized health advice, risky behaviors, and treatment advice  Social History   Social History Narrative   Graduated from [employer] in [place].  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      Current Social History 03/01/2021   Patient lives with granddaughter in an apartment on the first floor. There are not steps up to the entrance the patient uses.       Patient's method of transportation is personal car.      The highest level of education was some college.      The patient currently retired.      Identified important Relationships are "My brother, my son"       Pets : None, but I would like to have one       Interests / Fun: Love to read, play solitaire, Therapist, nutritional with sisters       Current Stressors: My community is getting violent,  strained family relationships      Religious / Personal Beliefs: Baptized [employer]             Objective:    Vitals: BP 140/72 (BP Location: Left Wrist)   Pulse 83   Ht 5\' 1"  (1.549 m)   Wt 148 lb (67.1 kg)   BMI 27.96 kg/m  Vitals are patient reported  Activities of Daily Living In your present state of health, do you have any difficulty performing the following activities: 03/01/2021 10/26/2020  Hearing? N N  Vision? N N  Difficulty concentrating or making decisions? N N  Walking or climbing stairs? Y N  Comment sometimes -  Dressing or bathing? N N  Doing errands, shopping? N N  Some recent data might be hidden    Goals Goals    .  Blood Pressure < 140/90    .  improve relationships with family members (pt-stated)    .  Increase physical activity      Begin seated and standing exercises with exercise band to increase strength and balance 3 times a week for 20 minutes each time     .  Quit smoking / using tobacco       Fall Risk Fall Risk  03/01/2021 10/26/2020 03/16/2020 12/16/2019 10/07/2019  Falls in the past year? 1 0 1 1 1   Number falls in past yr: 0 0 0 0 0  Injury with Fall? 1 0 1  1 1  Risk for fall due to : Impaired mobility;Other (Comment) - - - -  Risk for fall due to: Comment wearing flip flops, she does not wear them any longer - - - -  Follow up Falls evaluation completed - - - -    Depression Screen PHQ 2/9 Scores 03/01/2021 10/26/2020 03/16/2020 12/16/2019  PHQ - 2 Score 2 0 0 0  PHQ- 9 Score 4 1 3 3      Cognitive Testing Six-Item Cognitive Screener   "I would like to ask you some questions that ask you to use your memory. I am going to name three objects. Please wait until I say all three words, then repeat them. Remember what they are  because I am going to ask you to name them again in a few minutes. Please repeat these words for me: APPLE--TABLE--PENNY." (Interviewer may repeat names 3 times if necessary but repetition not  scored.)  Did patient correctly repeat all three words? Yes - may proceed with screen  What year is this? Correct What month is this? Correct What day of the week is this? Correct  What were the three objects I asked you to remember? . Apple Correct . Table Correct . Penny Correct  Score one point for each incorrect answer.  A score of 2 or more points warrants additional investigation.  Patient's score 0     Assessment and Plan:      A mammogram was ordered in 06/2020 which was never obtained, the patient was reminded this was due and will call [facility] for an appointment.  The patient received her 3rd Covid-19 Booster vaccine in 09/2021 and will call her pharmacy to schedule her TDaP vaccine.  The patient underwent a screening colonoscopy on 01/19/21 with Dr. Silverio Decamp.  The patient is still smoking cigarettes, but states she has cut back, only smoking 1 cigarette over several days.  She is not interested in any smoking cessation information at this time.  The patient has made a goal to increase her activity level.  She will begin seated and standing exercises with exercise band to increase strength and balance.  CDC Handout on Fall Prevention and Handout on Home Exercise Program, Access codes [id number] and [id number] given/mailed to patient with exercise band.   During the course of the visit the patient was educated and counseled about appropriate screening and preventive services as documented in the assessment and plan.  The printed AVS was given to the patient and included an updated screening schedule, a list of risk factors, and personalized health advice.        Higinio Roger, RN  03/01/2021

## 2021-03-03 ENCOUNTER — Telehealth: Payer: Self-pay | Admitting: *Deleted

## 2021-03-03 NOTE — Telephone Encounter (Signed)
Information was sent through CoverMyMeds for PA for Diclofenac Gel 1%.  Approved 11/27/2020 thru 11/26/2021.  Sander Nephew, RN 03/03/2021 10:28 AM.

## 2021-03-08 NOTE — Progress Notes (Signed)
Internal Medicine Clinic Attending  Case discussed with Dr.  Marva Panda  at the time of the visit.  We reviewed the AWV findings.  I agree with the assessment, diagnosis, and plan of care documented in the AWV note.

## 2021-03-08 NOTE — Progress Notes (Signed)
I discussed the AWV findings with the RN who conducted the visit. I was present in the office suite and immediately available to provide assistance and direction throughout the time the service was provided.  Harvie Heck, MD Internal Medicine, PGY-2

## 2021-03-22 ENCOUNTER — Ambulatory Visit (INDEPENDENT_AMBULATORY_CARE_PROVIDER_SITE_OTHER): Payer: Medicare HMO | Admitting: Internal Medicine

## 2021-03-22 ENCOUNTER — Encounter: Payer: Self-pay | Admitting: Internal Medicine

## 2021-03-22 VITALS — BP 143/62 | HR 74 | Temp 98.0°F | Ht 61.0 in | Wt 147.0 lb

## 2021-03-22 DIAGNOSIS — Z0000 Encounter for general adult medical examination without abnormal findings: Secondary | ICD-10-CM

## 2021-03-22 DIAGNOSIS — M62838 Other muscle spasm: Secondary | ICD-10-CM

## 2021-03-22 DIAGNOSIS — Z720 Tobacco use: Secondary | ICD-10-CM

## 2021-03-22 DIAGNOSIS — G609 Hereditary and idiopathic neuropathy, unspecified: Secondary | ICD-10-CM | POA: Diagnosis not present

## 2021-03-22 DIAGNOSIS — E559 Vitamin D deficiency, unspecified: Secondary | ICD-10-CM

## 2021-03-22 DIAGNOSIS — F339 Major depressive disorder, recurrent, unspecified: Secondary | ICD-10-CM

## 2021-03-22 DIAGNOSIS — E7800 Pure hypercholesterolemia, unspecified: Secondary | ICD-10-CM | POA: Diagnosis not present

## 2021-03-22 DIAGNOSIS — I10 Essential (primary) hypertension: Secondary | ICD-10-CM

## 2021-03-22 DIAGNOSIS — G5792 Unspecified mononeuropathy of left lower limb: Secondary | ICD-10-CM | POA: Diagnosis not present

## 2021-03-22 DIAGNOSIS — R69 Illness, unspecified: Secondary | ICD-10-CM | POA: Diagnosis not present

## 2021-03-22 DIAGNOSIS — Z23 Encounter for immunization: Secondary | ICD-10-CM | POA: Diagnosis not present

## 2021-03-22 MED ORDER — DICLOFENAC SODIUM 1 % EX GEL: 4.0000 g | Freq: Four times a day (QID) | CUTANEOUS | 3 refills | Status: AC

## 2021-03-22 NOTE — Patient Instructions (Addendum)
-   It was a pleasure seeing you again today -We will check some blood work on you today -We will give you a tetanus booster today -We will see if you qualify for lung cancer screening and attempt to get that for you -Please follow-up with Dr. Casimiro Needle for your depression -You can hold the gabapentin for now and see if that neuropathy worsens.  If the tingling and numbness worsens please restart the gabapentin -Please call me if have any questions or concerns or if you need any refills

## 2021-03-22 NOTE — Assessment & Plan Note (Signed)
-  This problem is chronic and worsening - Patient follows up with Dr. Casimiro Needle for this -States that she has been unable to get an appointment with him -Her PHQ-9 score is elevated to 11 today up from 1 in November -She states that she has had multiple unexpected deaths in the family including her mother and sister-in-law as well as her good friend -Patient states that she will call psychiatry for follow-up visit

## 2021-03-22 NOTE — Assessment & Plan Note (Addendum)
BP Readings from Last 3 Encounters:  03/22/21 (!) 143/62  03/01/21 140/72  01/19/21 (!) 147/78    Lab Results  Component Value Date   NA 143 12/16/2019   K 3.9 12/16/2019   CREATININE 0.72 12/16/2019    Assessment: Blood pressure control:  Fair Progress toward BP goal:   Unchanged            Comments: Patient is compliant with lisinopril/HCTZ 20/25 mg 2 tabs daily and amlodipine 10 mg  Plan: Medications:  continue current medications Educational resources provided:   Self management tools provided:   Other plans: We will check BMP today.  If patient blood pressure remains elevated on follow-up visit will need additional medication (possibly carvedilol)  Addendum: -Patient's BMP showed a normal creatinine but a mildly low bicarbonate level and a mildly elevated anion gap.  Patient is not on any medication that can cause euglycemic DKA and has no signs of an infection that would lead to the lactic acidosis.  We will recheck her BMP at her follow-up visit.  No further work-up at this time

## 2021-03-22 NOTE — Progress Notes (Signed)
   Subjective:    Patient ID: Mia Underwood, female    DOB: 11-27-1951, 70 y.o.   MRN: [id number]  HPI  I have seen and examined this patient.  Patient is here for routine follow-up of her hypertension and neuropathy.  Patient states that she lost her mother, good friend and sister-in-law recently and this has caused worsening of her depression.  She also complains of intermittent tingling and numbness over her left lower extremity.  She denies any other complaints at this time and states that she is compliant with all her medications.    Review of Systems  Constitutional: Negative.   HENT: Negative.   Respiratory: Negative.   Cardiovascular: Negative.   Gastrointestinal: Negative.   Musculoskeletal: Negative.   Neurological: Negative.   Psychiatric/Behavioral:       Patient complains of depressed mood in the setting of recent loss of friends and family members       Objective:   Physical Exam Constitutional:      Appearance: Normal appearance.  HENT:     Head: Normocephalic and atraumatic.  Cardiovascular:     Rate and Rhythm: Normal rate and regular rhythm.  Pulmonary:     Breath sounds: Normal breath sounds. No wheezing or rales.  Abdominal:     General: Bowel sounds are normal. There is no distension.     Palpations: Abdomen is soft.     Tenderness: There is no abdominal tenderness.  Musculoskeletal:        General: No swelling or tenderness.     Cervical back: Neck supple.  Lymphadenopathy:     Cervical: No cervical adenopathy.  Neurological:     Mental Status: She is alert and oriented to person, place, and time.  Psychiatric:        Mood and Affect: Mood normal.        Behavior: Behavior normal.           Assessment & Plan:  Please see problem based charting for assessment and plan:

## 2021-03-22 NOTE — Assessment & Plan Note (Addendum)
-   This problem is chronic and stable -Patient is compliant with simvastatin 20 mg daily -We will follow-up lipid panel today and consider transitioning her to a high intensity statin if her ASCVD score is elevated  Addendum: -Patient has an elevated 10-year ASCVD risk of 26% -Will DC simvastatin and start the patient on rosuvastatin 20 mg daily -We we will recheck lipid panel as well as LFTs on next visit -No further work-up at this time

## 2021-03-22 NOTE — Assessment & Plan Note (Addendum)
-   Patient complains of intermittent left lower extremity numbness and tingling which is been going on for "years" -We will recheck a vitamin B12 level as well as a TSH today -Would consider checking an A1c at her follow-up visit if her vitamin B12 level and TSH are within normal limits -Patient states that she has been taking gabapentin for this but is unsure if this has been effective or not.  She would like to hold this medication for now and will restart it if her symptoms worsen -No further work-up at this time  Addendum: -Patient B12 level is borderline low at 243.  We will start her on vitamin B12 supplementation today and have her follow-up for repeat blood work.  If remains low will consider IM vitamin B12 injections (initially once weekly x4 weeks and then once a month). -TSH is within normal limits

## 2021-03-22 NOTE — Assessment & Plan Note (Addendum)
-   This problem is chronic and stable -We will recheck vitamin D level today -Patient states that she is compliant with her vitamin D supplements  Addendum: -Patient has persistently low vitamin B12 level at 21 -We will increase her vitamin D supplementation to 2000 IU daily -We will recheck vitamin D level at her next visit -No further work-up at this time

## 2021-03-22 NOTE — Assessment & Plan Note (Addendum)
-   Tdap given today -Given patient's extensive smoking history we will refer her for a low-dose CT scan for lung cancer screening

## 2021-03-22 NOTE — Assessment & Plan Note (Signed)
-   This problem is chronic and stable -Patient states that she continues to smoke 1 pack/week -She states that she has been stressed since the death of her mother and sister-in-law as well as recurrent -Patient states that she will try and quit.  She did not like the taste of the nicotine lozenges -We will discuss this with her in further detail at her next visit

## 2021-03-23 LAB — BMP8+ANION GAP
Anion Gap: 19 mmol/L — ABNORMAL HIGH (ref 10.0–18.0)
BUN/Creatinine Ratio: 12 (ref 12–28)
BUN: 8 mg/dL (ref 8–27)
CO2: 19 mmol/L — ABNORMAL LOW (ref 20–29)
Calcium: 9.9 mg/dL (ref 8.7–10.3)
Chloride: 104 mmol/L (ref 96–106)
Creatinine, Ser: 0.66 mg/dL (ref 0.57–1.00)
Glucose: 98 mg/dL (ref 65–99)
Potassium: 4.1 mmol/L (ref 3.5–5.2)
Sodium: 142 mmol/L (ref 134–144)
eGFR: 95 mL/min/{1.73_m2} (ref >59)

## 2021-03-23 LAB — LIPID PANEL
Chol/HDL Ratio: 3.5 ratio (ref 0.0–4.4)
Cholesterol, Total: 212 mg/dL — ABNORMAL HIGH (ref 100–199)
HDL: 61 mg/dL (ref >39)
LDL Chol Calc (NIH): 133 mg/dL — ABNORMAL HIGH (ref 0–99)
Triglycerides: 103 mg/dL (ref 0–149)
VLDL Cholesterol Cal: 18 mg/dL (ref 5–40)

## 2021-03-23 LAB — VITAMIN B12: Vitamin B-12: 243 pg/mL (ref 232–1245)

## 2021-03-23 LAB — VITAMIN D 25 HYDROXY (VIT D DEFICIENCY, FRACTURES): Vit D, 25-Hydroxy: 21.9 ng/mL — ABNORMAL LOW (ref 30.0–100.0)

## 2021-03-23 LAB — TSH: TSH: 0.859 u[IU]/mL (ref 0.450–4.500)

## 2021-03-24 ENCOUNTER — Other Ambulatory Visit (HOSPITAL_COMMUNITY): Payer: Self-pay | Admitting: Psychiatry

## 2021-03-28 MED ORDER — ROSUVASTATIN CALCIUM 20 MG PO TABS: 20.0000 mg | ORAL_TABLET | Freq: Every day | ORAL | 3 refills | Status: DC

## 2021-03-28 MED ORDER — VITAMIN D 50 MCG (2000 UT) PO TABS: 2000.0000 [IU] | ORAL_TABLET | Freq: Every day | ORAL | 3 refills | Status: DC

## 2021-03-28 MED ORDER — VITAMIN B-12 1000 MCG PO TABS: 1000.0000 ug | ORAL_TABLET | Freq: Every day | ORAL | 0 refills | Status: DC

## 2021-03-28 NOTE — Addendum Note (Signed)
Addended by: Aldine Contes on: 03/28/2021 11:07 AM   Modules accepted: Orders

## 2021-04-05 ENCOUNTER — Other Ambulatory Visit (HOSPITAL_COMMUNITY): Payer: Self-pay | Admitting: Psychiatry

## 2021-04-06 ENCOUNTER — Other Ambulatory Visit: Payer: Self-pay | Admitting: Internal Medicine

## 2021-04-06 ENCOUNTER — Telehealth: Payer: Self-pay

## 2021-04-06 ENCOUNTER — Telehealth (HOSPITAL_COMMUNITY): Payer: Self-pay | Admitting: *Deleted

## 2021-04-06 DIAGNOSIS — Z1231 Encounter for screening mammogram for malignant neoplasm of breast: Secondary | ICD-10-CM

## 2021-04-06 NOTE — Telephone Encounter (Signed)
Returned call to patient. She was wondering if she needed another TDaP vaccine. Explained she received one on 4/26 at her OV with PCP. Vaccine is good for 10 years. She is all caught up on vaccines and next due will be flu vaccine starting 07/28/21. She was very Patent attorney.

## 2021-04-06 NOTE — Telephone Encounter (Signed)
Requesting to speak with a nurse about immunizations. Please call pt back.

## 2021-04-06 NOTE — Telephone Encounter (Signed)
Patient called for refill of Trazodone.  Discussed with Dr Marya Fossa.  Patient to be given refill once she makes an appt.  Front desk attempted to call several times.  Patient has no voicemail.  [place] desk staff will continue to call.

## 2021-04-08 ENCOUNTER — Telehealth (HOSPITAL_COMMUNITY): Payer: Self-pay

## 2021-04-08 NOTE — Telephone Encounter (Signed)
Medication refill request - Attempted to reach patient back to follow up on message she left requesting Dr. Casimiro Needle send in a refill of her prescribed Trazodone.  Informed Dr. Casimiro Needle would be back in the offfice 04/12/21 and would send him the request.  Also, informed patient I would request our admin staff contact her back to set up a new appointment as she has not met with Dr. Casimiro Needle since 03/24/20 and missed appointment 10/27/20.  Patient to call back if any questions and requested refill be sent to her [place] [facility].

## 2021-04-11 ENCOUNTER — Telehealth (HOSPITAL_COMMUNITY): Payer: Self-pay

## 2021-04-11 ENCOUNTER — Ambulatory Visit: Payer: Medicare HMO

## 2021-04-11 MED ORDER — TRAZODONE HCL 50 MG PO TABS: ORAL_TABLET | ORAL | 0 refills | Status: DC

## 2021-04-11 NOTE — Telephone Encounter (Signed)
Medication management - Telephone call with pt that Dr. Casimiro Needle approved a one time refill of Trazodone and for pt to keep appt5/18/22 for evaluation and further refills.Patient agreed with plan.

## 2021-04-13 ENCOUNTER — Ambulatory Visit (INDEPENDENT_AMBULATORY_CARE_PROVIDER_SITE_OTHER): Payer: Medicare HMO | Admitting: Psychiatry

## 2021-04-13 ENCOUNTER — Other Ambulatory Visit: Payer: Self-pay

## 2021-04-13 DIAGNOSIS — F325 Major depressive disorder, single episode, in full remission: Secondary | ICD-10-CM | POA: Diagnosis not present

## 2021-04-13 DIAGNOSIS — R69 Illness, unspecified: Secondary | ICD-10-CM | POA: Diagnosis not present

## 2021-04-13 MED ORDER — SERTRALINE HCL 100 MG PO TABS: ORAL_TABLET | ORAL | 5 refills | Status: DC

## 2021-04-13 MED ORDER — TRAZODONE HCL 50 MG PO TABS: ORAL_TABLET | ORAL | 8 refills | Status: DC

## 2021-04-13 NOTE — Progress Notes (Signed)
Psychiatric Initial Adult Assessment   Patient Identification: Mia Underwood MRN:  [id number] Date of Evaluation:  04/13/2021 Referral Source: [facility] [place] Chief Complaint: Clinical depression  Today the patient is actually doing fairly well.  She has not been seen in over a year.  The only thing that happened is she has had a lot of deaths in the last 8 months.  Her mother died and then 3).  The patient seems to be handling it fairly well.  She lives with her granddaughter who is a Paramedic in Secretary/administrator.  She seems to be doing okay.  The patient is sleeping and eating well.  She is got good energy.  She has no problems thinking and concentrating.  She is enjoying life as best she can.  She has hypertension which is now well controlled and she is working.  The patient drinks no alcohol and uses no drugs.  She is very articulate and very interactive.  She is interested in getting back into therapy.  She will call our center in the next week and find out who she can see.  For now she is not anhedonic and she is not depressed.  She denies any significant anxiety.  She has no evidence of any psychosis.  She is actually functioning fairly well.  She still drives and is very active. Past Psychiatric History:   Previous Psychotropic Medications: Yes   Substance Abuse History in the last 12 months:  Yes.    Consequences of Substance Abuse:   Past Medical History:  Past Medical History:  Diagnosis Date  . Allergic rhinitis 05/04/2016  . Arthritis   . Bilateral carpal tunnel syndrome 10/18/2010   Left > Right   . Colles' fracture of left radius 07/01/2019  . Diverticulosis of sigmoid colon 06/07/2010  . Essential hypertension 09/14/2009  . Hyperlipidemia 09/14/2009  . 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 Major depression, recurrent, chronic ([place]) 09/14/2009  . Marijuana abuse 04/04/2016  . Tobacco abuse 01/17/2016  . Upper respiratory infection, viral 02/03/2019  . Vitamin D  deficiency 04/04/2016    Past Surgical History:  Procedure Laterality Date  . ABDOMINAL HYSTERECTOMY  April 1996   For fibroids  . COLONOSCOPY  07/18/2010   Patterson-tic's    Family Psychiatric History:   Family History:  Family History  Problem Relation Age of Onset  . Breast cancer Mother   . Depression Mother   . Hypertension Mother   . Dementia Mother   . Alcoholism Father        Died in his 31's related to alcohol abuse  . Leukemia Sister 8  . Early death Brother        Died in a motor vehicle accident  . Heart attack Daughter 76       Per patient report, specifics unknown  . Diabetes Mellitus II Maternal Grandmother   . Diabetes Mellitus II Paternal Grandmother   . Healthy Sister   . Healthy Sister   . Arthritis Brother   . Colon polyps Brother   . Healthy Brother   . Healthy Brother   . Healthy Son   . Breast cancer Maternal Aunt   . Breast cancer Cousin   . Breast cancer Maternal Aunt   . Colon cancer Neg Hx   . Esophageal cancer Neg Hx   . Rectal cancer Neg Hx   . Stomach cancer Neg Hx     Social History:   Social History   Socioeconomic History  .  Marital status: Single    Spouse name: Not on file  . Number of children: 2  . Years of education: 12 grade  . Highest education level: Not on file  Occupational History  . Occupation: Leisure centre manager: [employer]    Comment: Retired  Tobacco Use  . Smoking status: Current Some Day Smoker    Packs/day: 0.20    Types: Cigarettes    Start date: 12/02/1973  . Smokeless tobacco: Never Used  . Tobacco comment: Stopped in 1980 restarted in 1985. 1pack every 8 days.  Stopped on 01/20/2016. Restarted again. Now 1 pack per week  Vaping Use  . Vaping Use: Never used  Substance and Sexual Activity  . Alcohol use: Yes    Alcohol/week: 2.0 standard drinks    Types: 1 Glasses of wine, 1 Cans of beer per week    Comment: drinks socially with friends  . Drug use: No  . Sexual activity: Not Currently  Other  Topics Concern  . Not on file  Social History Narrative   Graduated from [employer] in [place].  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      Current Social History 03/01/2021   Patient lives with granddaughter in an apartment on the first floor. There are not steps up to the entrance the patient uses.       Patient's method of transportation is personal car.      The highest level of education was some college.      The patient currently retired.      Identified important Relationships are "My brother, my son"       Pets : None, but I would like to have one       Interests / Fun: Love to read, play solitaire, Therapist, nutritional with sisters       Current Stressors: My community is getting violent, strained family relationships      Religious / Personal Beliefs: Baptized [employer]       Social Determinants of Radio broadcast assistant Strain: Not on file  Food Insecurity: Not on file  Transportation Needs: Not on file  Physical Activity: Not on file  Stress: Not on file  Social Connections: Not on file    Additional Social History:   Allergies:   Allergies  Allergen Reactions  . Penicillins Hives  . Naproxen Rash    Pt states she takes Aleve without problems    Metabolic Disorder Labs: No results found for: HGBA1C, MPG No results found for: PROLACTIN Lab Results  Component Value Date   CHOL 212 (H) 03/22/2021   TRIG 103 03/22/2021   HDL 61 03/22/2021   CHOLHDL 3.5 03/22/2021   VLDL 20 11/01/2010   LDLCALC 133 (H) 03/22/2021   LDLCALC 111 (H) 12/16/2019     Current Medications: Current Outpatient Medications  Medication Sig Dispense Refill  . amLODipine (NORVASC) 10 MG tablet Take 1 tablet (10 mg total) by mouth daily. 90 tablet 3  . calcium carbonate (OS-CAL) 600 MG TABS tablet Take 1 tablet (600 mg total) by mouth 2 (two) times daily with a meal. 180 tablet 3  . cetirizine (ZYRTEC) 10 MG tablet Take 1 tablet (10 mg  total) by mouth at bedtime. 90 tablet 3  . Cholecalciferol (VITAMIN D) 50 MCG (2000 UT) tablet Take 1 tablet (2,000 Units total) by mouth daily. 90 tablet 3  . diclofenac Sodium (VOLTAREN) 1 % GEL Apply  4 g topically 4 (four) times daily. 150 g 3  . gabapentin (NEURONTIN) 600 MG tablet Take 1 tablet (600 mg total) by mouth 2 (two) times daily. 180 tablet 3  . lisinopril-hydrochlorothiazide (ZESTORETIC) 20-25 MG tablet Take 2 tablets by mouth daily. 180 tablet 3  . naproxen sodium (ALEVE) 220 MG tablet Take 220 mg by mouth.    . rosuvastatin (CRESTOR) 20 MG tablet Take 1 tablet (20 mg total) by mouth daily. 90 tablet 3  . sertraline (ZOLOFT) 100 MG tablet 1  qam 60 tablet 5  . traZODone (DESYREL) 50 MG tablet TAKE 2 TABLETS BY MOUTH EVERY NIGHT AT BEDTIME 60 tablet 8  . vitamin B-12 (CYANOCOBALAMIN) 1000 MCG tablet Take 1 tablet (1,000 mcg total) by mouth daily. 30 tablet 0   No current facility-administered medications for this visit.    Neurologic: Headache: No Seizure: No Paresthesias:No  Musculoskeletal: Strength & Muscle Tone: within normal limits Gait & Station: normal Patient leans: N/A  Psychiatric Specialty Exam: ROS  There were no vitals taken for this visit.There is no height or weight on file to calculate BMI.  General Appearance: Casual  Eye Contact:  Fair  Speech:  Clear and Coherent  Volume:  Normal  Mood:  Depressed  Affect:  Blunt  Thought Process:  Goal Directed  Orientation:  Full (Time, Place, and Person)  Thought Content:  WDL  Suicidal Thoughts:  No  Homicidal Thoughts:  No  Memory:  NA  Judgement:  Good  Insight:  Good  Psychomotor Activity:  Normal  Concentration:    Recall:  [place] of Knowledge:Good  Language: Poor  Akathisia:  No  Handed:  Right  AIMS (if indicated):    Assets:  Desire for Improvement  ADL's:  Intact  Cognition: WNL  Sleep:      Treatment Plan Summary: 5/18/20224:54 PM   This patient's first problem is that of  major depression.  She continues taking Zoloft 100 mg without problem.  Her second problem is insomnia.  The patient takes 100 mg of trazodone.  Today she was given the warning about getting up slow from her bed.  Overall the patient is doing great.  She will return to see me in approximately 6 months.

## 2021-05-02 DIAGNOSIS — Z20822 Contact with and (suspected) exposure to covid-19: Secondary | ICD-10-CM | POA: Diagnosis not present

## 2021-05-09 ENCOUNTER — Other Ambulatory Visit: Payer: Self-pay

## 2021-05-09 ENCOUNTER — Ambulatory Visit (HOSPITAL_BASED_OUTPATIENT_CLINIC_OR_DEPARTMENT_OTHER)
Admission: RE | Admit: 2021-05-09 | Discharge: 2021-05-09 | Disposition: A | Discharge status: Home / Self Care | Payer: Medicare HMO | Source: Ambulatory Visit | Attending: Internal Medicine | Admitting: Internal Medicine

## 2021-05-09 DIAGNOSIS — Z720 Tobacco use: Secondary | ICD-10-CM

## 2021-05-09 DIAGNOSIS — R69 Illness, unspecified: Secondary | ICD-10-CM | POA: Diagnosis not present

## 2021-05-09 DIAGNOSIS — I2510 Atherosclerotic heart disease of native coronary artery without angina pectoris: Secondary | ICD-10-CM | POA: Insufficient documentation

## 2021-05-09 DIAGNOSIS — F17210 Nicotine dependence, cigarettes, uncomplicated: Secondary | ICD-10-CM | POA: Diagnosis not present

## 2021-05-09 DIAGNOSIS — Z122 Encounter for screening for malignant neoplasm of respiratory organs: Secondary | ICD-10-CM | POA: Insufficient documentation

## 2021-05-09 DIAGNOSIS — Z20822 Contact with and (suspected) exposure to covid-19: Secondary | ICD-10-CM | POA: Diagnosis not present

## 2021-05-09 DIAGNOSIS — J439 Emphysema, unspecified: Secondary | ICD-10-CM | POA: Diagnosis not present

## 2021-05-09 DIAGNOSIS — Z0000 Encounter for general adult medical examination without abnormal findings: Secondary | ICD-10-CM

## 2021-05-09 DIAGNOSIS — I700 Atherosclerosis of aorta: Secondary | ICD-10-CM | POA: Diagnosis not present

## 2021-05-17 DIAGNOSIS — Z20822 Contact with and (suspected) exposure to covid-19: Secondary | ICD-10-CM | POA: Diagnosis not present

## 2021-06-01 ENCOUNTER — Ambulatory Visit
Admission: RE | Admit: 2021-06-01 | Discharge: 2021-06-01 | Disposition: A | Discharge status: Home / Self Care | Payer: Medicare HMO | Source: Ambulatory Visit | Attending: Internal Medicine | Admitting: Internal Medicine

## 2021-06-01 ENCOUNTER — Other Ambulatory Visit: Payer: Self-pay

## 2021-06-01 DIAGNOSIS — Z1231 Encounter for screening mammogram for malignant neoplasm of breast: Secondary | ICD-10-CM | POA: Diagnosis not present

## 2021-06-02 DIAGNOSIS — Z20822 Contact with and (suspected) exposure to covid-19: Secondary | ICD-10-CM | POA: Diagnosis not present

## 2021-06-15 ENCOUNTER — Telehealth: Payer: Self-pay

## 2021-06-15 DIAGNOSIS — Z20822 Contact with and (suspected) exposure to covid-19: Secondary | ICD-10-CM | POA: Diagnosis not present

## 2021-06-15 NOTE — Telephone Encounter (Signed)
Return pt's call. Stated for 1 1/2 weeks she has been having trouble with a corn on bottom of left toe. Stated she has not mention this to Dr Dareen Piano since it has not been a problem. Informed pt she needs to seen and evaluated prior to a referral - pt isagreeable. Appt schedule with Dr Marlou Sa on Monday 7/25 @ 0845 AM.

## 2021-06-15 NOTE — Telephone Encounter (Signed)
I agree. Thank you.

## 2021-06-15 NOTE — Telephone Encounter (Signed)
Pt is requesting a call back .. she stated that she is having issues with her foot ( left ) big toe .

## 2021-06-20 ENCOUNTER — Other Ambulatory Visit: Payer: Self-pay

## 2021-06-20 ENCOUNTER — Ambulatory Visit (INDEPENDENT_AMBULATORY_CARE_PROVIDER_SITE_OTHER): Payer: Medicare HMO | Admitting: Internal Medicine

## 2021-06-20 ENCOUNTER — Encounter: Payer: Self-pay | Admitting: Internal Medicine

## 2021-06-20 VITALS — BP 134/81 | HR 84 | Temp 98.1°F | Ht 60.0 in | Wt 142.2 lb

## 2021-06-20 DIAGNOSIS — E7800 Pure hypercholesterolemia, unspecified: Secondary | ICD-10-CM

## 2021-06-20 DIAGNOSIS — G5792 Unspecified mononeuropathy of left lower limb: Secondary | ICD-10-CM

## 2021-06-20 DIAGNOSIS — E559 Vitamin D deficiency, unspecified: Secondary | ICD-10-CM | POA: Diagnosis not present

## 2021-06-20 DIAGNOSIS — R252 Cramp and spasm: Secondary | ICD-10-CM

## 2021-06-20 DIAGNOSIS — I10 Essential (primary) hypertension: Secondary | ICD-10-CM | POA: Diagnosis not present

## 2021-06-20 DIAGNOSIS — E872 Acidosis: Secondary | ICD-10-CM | POA: Diagnosis not present

## 2021-06-20 NOTE — Assessment & Plan Note (Addendum)
BMP stable today at 134/81. Last BMP in May 2022 showed elevated anion gap and and decreased bicarbonate. Check CMP today.

## 2021-06-20 NOTE — Patient Instructions (Addendum)
Thank you for visiting the [facility] today. It was a pleasure to meet you! Today we discussed the corn on your left big toe. I am glad to hear that it is doing well today and not painful! For now, please keep using salicylic acid drops and corn pads as you have been. Remember, don't pull anything out of this area!   I have ordered the following for you:  Lab orders: Lipid panel: to check your cholesterol  Vitamin D level Vitamin B12 level CMP: to check your liver, kidneys, and electrolytes  Follow-up: Please let us know if the corn on your toe gets worse or if the pain returns. We can put in a referral for podiatry if you notice any worsening of the area.  Remember: If you have any questions or concerns, please call our clinic at [phone] between 9am-5pm and after hours call [phone] and ask for the internal medicine resident on call. If you feel you are having a medical emergency please call 911.  Farrel Gordon, DO

## 2021-06-20 NOTE — Assessment & Plan Note (Signed)
Patient denies continued neuropathic symptoms since starting vitamin B12 supplementation in May 2022. Repeat vitamin B12 labs today.

## 2021-06-20 NOTE — Assessment & Plan Note (Signed)
Patient endorses vitamin D 2000 IU supplementation. She has a history of deficiency. Recheck vitamin D levels today.

## 2021-06-20 NOTE — Assessment & Plan Note (Addendum)
Patient started on rosuvastatin 20 mg in May 2022. Recheck lipid panel and CMP today.  Addendum 06/21/2021: Alk phos elevated at 122 (121 is ULN). Continue rosuvastatin 20 mg once daily.

## 2021-06-20 NOTE — Progress Notes (Signed)
   CC: L great toe corn  HPI:  Mia Underwood is a 70 y.o. female with past medical history as listed below who presents for recurrent corn on the plantar aspect of her L great toe. She states that the area started as a hard, white blister that did not pop or open and drain. She says that she began using home remedies such as salicylic acid drops and corn pads and pulled out "something that looked like a hangnail" about a week ago once the home remedies had allowed her to remove the old tissue. She has continued her home remedies, doing these changes on Monday, Wednesday, Friday, and Sunday. She cleans her feet then applies these. She is not using any top of buffer to exfoliate the skin. She does endorse keeping her feet moisturized. She says that she does have intermittent pain, 6/10, that feels like the area is being poked up into. She typically wears supportive shoes with insoles.   Past Medical History:  Diagnosis Date   Allergic rhinitis 05/04/2016   Arthritis    Bilateral carpal tunnel syndrome 10/18/2010   Left > Right    Colles' fracture of left radius 07/01/2019   Diverticulosis of sigmoid colon 06/07/2010   Essential hypertension 09/14/2009   Hyperlipidemia 09/14/2009   Insomnia secondary to depression with anxiety 05/04/2016   Major depression, recurrent, chronic ([place]) 09/14/2009   Marijuana abuse 04/04/2016   Tobacco abuse 01/17/2016   Upper respiratory infection, viral 02/03/2019   Vitamin D deficiency 04/04/2016   Review of Systems:  Review of Systems  Constitutional:  Negative for chills, fever and weight loss.  Respiratory:  Negative for shortness of breath.   Cardiovascular:  Negative for chest pain.  Gastrointestinal:  Negative for abdominal pain, constipation and diarrhea.  Genitourinary:  Negative for dysuria.  Neurological:  Negative for sensory change and weakness.    Physical Exam:  Vitals:   06/20/21 0859  BP: 134/81  Pulse: 84  Temp: 98.1 F (36.7 C)  TempSrc:  Oral  SpO2: 97%  Weight: 142 lb 3.2 oz (64.5 kg)  Height: 5' (1.524 m)   Physical Exam Constitutional:      Appearance: Normal appearance.  Cardiovascular:     Rate and Rhythm: Normal rate and regular rhythm.     Pulses:          Posterior tibial pulses are 2+ on the right side and 2+ on the left side.     Heart sounds: Normal heart sounds.  Pulmonary:     Effort: Pulmonary effort is normal.     Breath sounds: Normal breath sounds.  Musculoskeletal:       Feet:  Feet:     Right foot:     Toenail Condition: Right toenails are normal.     Left foot:     Skin integrity: Callus (firm circular area on plantar aspect of L great toe with white colored center that is not fluctuant or painful) present.     Toenail Condition: Left toenails are normal.  Skin:    General: Skin is warm and dry.  Neurological:     General: No focal deficit present.     Mental Status: She is alert and oriented to person, place, and time.     Assessment & Plan:   See Encounters Tab for problem based charting.  Patient seen with Dr. Dareen Piano

## 2021-06-21 LAB — CMP14 + ANION GAP
ALT: 11 IU/L (ref 0–32)
AST: 16 IU/L (ref 0–40)
Albumin/Globulin Ratio: 1.9 (ref 1.2–2.2)
Albumin: 4.4 g/dL (ref 3.8–4.8)
Alkaline Phosphatase: 122 IU/L — ABNORMAL HIGH (ref 44–121)
Anion Gap: 15 mmol/L (ref 10.0–18.0)
BUN/Creatinine Ratio: 25 (ref 12–28)
BUN: 16 mg/dL (ref 8–27)
Bilirubin Total: 0.2 mg/dL (ref 0.0–1.2)
CO2: 24 mmol/L (ref 20–29)
Calcium: 9.7 mg/dL (ref 8.7–10.3)
Chloride: 104 mmol/L (ref 96–106)
Creatinine, Ser: 0.65 mg/dL (ref 0.57–1.00)
Globulin, Total: 2.3 g/dL (ref 1.5–4.5)
Glucose: 98 mg/dL (ref 65–99)
Potassium: 4 mmol/L (ref 3.5–5.2)
Sodium: 143 mmol/L (ref 134–144)
Total Protein: 6.7 g/dL (ref 6.0–8.5)
eGFR: 96 mL/min/{1.73_m2} (ref >59)

## 2021-06-21 LAB — LIPID PANEL
Chol/HDL Ratio: 2.5 ratio (ref 0.0–4.4)
Cholesterol, Total: 136 mg/dL (ref 100–199)
HDL: 54 mg/dL (ref >39)
LDL Chol Calc (NIH): 70 mg/dL (ref 0–99)
Triglycerides: 57 mg/dL (ref 0–149)
VLDL Cholesterol Cal: 12 mg/dL (ref 5–40)

## 2021-06-21 LAB — VITAMIN B12: Vitamin B-12: 275 pg/mL (ref 232–1245)

## 2021-06-21 LAB — VITAMIN D 25 HYDROXY (VIT D DEFICIENCY, FRACTURES): Vit D, 25-Hydroxy: 43.4 ng/mL (ref 30.0–100.0)

## 2021-06-23 NOTE — Progress Notes (Signed)
Internal Medicine Clinic Attending  I saw and evaluated the patient.  I personally confirmed the key portions of the history and exam documented by Dr.  Dean  and I reviewed pertinent patient test results.  The assessment, diagnosis, and plan were formulated together and I agree with the documentation in the resident's note.  

## 2021-08-17 DIAGNOSIS — Z1152 Encounter for screening for COVID-19: Secondary | ICD-10-CM | POA: Diagnosis not present

## 2021-09-07 ENCOUNTER — Ambulatory Visit (HOSPITAL_BASED_OUTPATIENT_CLINIC_OR_DEPARTMENT_OTHER): Payer: Medicare HMO | Admitting: Psychiatry

## 2021-09-07 ENCOUNTER — Other Ambulatory Visit: Payer: Self-pay

## 2021-09-07 DIAGNOSIS — R69 Illness, unspecified: Secondary | ICD-10-CM | POA: Diagnosis not present

## 2021-09-07 DIAGNOSIS — F325 Major depressive disorder, single episode, in full remission: Secondary | ICD-10-CM

## 2021-09-07 MED ORDER — SERTRALINE HCL 100 MG PO TABS: ORAL_TABLET | ORAL | 5 refills | Status: DC

## 2021-09-07 MED ORDER — TRAZODONE HCL 50 MG PO TABS: ORAL_TABLET | ORAL | 8 refills | Status: DC

## 2021-09-07 NOTE — Progress Notes (Signed)
Psychiatric Initial Adult Assessment   Patient Identification: Mia Underwood MRN:  [id number] Date of Evaluation:  09/07/2021 Referral Source: Eustaquio Maize [place] Chief Complaint: Clinical depression   Today the patient is doing fairly well.  Her biggest issues have to do with her granddaughters.  It is noted the patient's daughter died of heart disease in 201  She has she has 1 granddaughter named Almyra Free who lives with her.  She seems to be doing quite well.  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  She seems to be stable.  Julia's other sister Tessie Fass is more of an issue for the patient.  Sharyne Richters has 2 children who are the patient's great-grandsons.  There is apparently conflict between the patient and Jalyn.  Overall patient's mood is fairly stable.  She has depression 2 or 3 days out of the week.  This seems to be pretty much her baseline.  She actually is sleeping and eating well.  She is enjoying life.  She loves reading watching TV and listening to music.  When her last visit we talked about therapy and today we will give her some specific therapist to contact.  The patient stays very active.  She continues to drive.  She has no evidence of psychosis.  She drinks no alcohol and uses no drugs.  She knows she has to do some different moves with her daughter.  She clearly would benefit by being in therapy.  The patient is articulate and intelligent. Past Psychiatric History:   Previous Psychotropic Medications: Yes   Substance Abuse History in the last 12 months:  Yes.    Consequences of Substance Abuse:   Past Medical History:  Past Medical History:  Diagnosis Date   Allergic rhinitis 05/04/2016   Arthritis    Bilateral carpal tunnel syndrome 10/18/2010   Left > Right    Colles' fracture of left radius 07/01/2019   Diverticulosis of sigmoid colon 06/07/2010   Essential hypertension 09/14/2009   Hyperlipidemia 09/14/2009   Insomnia secondary to depression with anxiety 05/04/2016   Major  depression, recurrent, chronic ([place]) 09/14/2009   Marijuana abuse 04/04/2016   Tobacco abuse 01/17/2016   Upper respiratory infection, viral 02/03/2019   Vitamin D deficiency 04/04/2016    Past Surgical History:  Procedure Laterality Date   ABDOMINAL HYSTERECTOMY  April 1996   For fibroids   COLONOSCOPY  07/18/2010   Patterson-tic's    Family Psychiatric History:   Family History:  Family History  Problem Relation Age of Onset   Breast cancer Mother    Depression Mother    Hypertension Mother    Dementia Mother    Alcoholism Father        Died in his 62's related to alcohol abuse   Leukemia Sister 8   Early death Brother        Died in a Teacher, music accident   Heart attack Daughter 28       Per patient report, specifics unknown   Diabetes Mellitus II Maternal Grandmother    Diabetes Mellitus II Paternal Grandmother    Healthy Sister    Healthy Sister    Arthritis Brother    Colon polyps Brother    Healthy Brother    Healthy Brother    Healthy Son    Breast cancer Maternal Aunt    Breast cancer Cousin    Breast cancer Maternal Aunt    Colon cancer Neg Hx    Esophageal cancer Neg Hx    Rectal  cancer Neg Hx    Stomach cancer Neg Hx     Social History:   Social History   Socioeconomic History   Marital status: Single    Spouse name: Not on file   Number of children: 2   Years of education: 12 grade   Highest education level: Not on file  Occupational History   Occupation: Leisure centre manager:  [employer]    Comment: Retired  Tobacco Use   Smoking status: Some Days    Packs/day: 0.20    Types: Cigarettes    Start date: 12/02/1973   Smokeless tobacco: Never   Tobacco comments:    Stopped in 1980 restarted in 1985. 1pack every 8 days.  Stopped on 01/20/2016. Restarted again. Now 1 pack per week  Vaping Use   Vaping Use: Never used  Substance and Sexual Activity   Alcohol use: Yes    Alcohol/week: 2.0 standard drinks    Types: 1 Glasses of wine, 1 Cans of  beer per week    Comment: drinks socially with friends   Drug use: No   Sexual activity: Not Currently  Other Topics Concern   Not on file  Social History Narrative   Graduated from [employer] in [place].  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      Current Social History 03/01/2021   Patient lives with granddaughter in an apartment on the first floor. There are not steps up to the entrance the patient uses.       Patient's method of transportation is personal car.      The highest level of education was some college.      The patient currently retired.      Identified important Relationships are "My brother, my son"       Pets : None, but I would like to have one       Interests / Fun: Love to read, play solitaire, Therapist, nutritional with sisters       Current Stressors: My community is getting violent, strained family relationships      Religious / Personal Beliefs: Baptized [employer]       Social Determinants of Radio broadcast assistant Strain: Not on file  Food Insecurity: Not on file  Transportation Needs: Not on file  Physical Activity: Not on file  Stress: Not on file  Social Connections: Not on file    Additional Social History:   Allergies:   Allergies  Allergen Reactions   Penicillins Hives   Naproxen Rash    Pt states she takes Aleve without problems    Metabolic Disorder Labs: No results found for: HGBA1C, MPG No results found for: PROLACTIN Lab Results  Component Value Date   CHOL 136 06/20/2021   TRIG 57 06/20/2021   HDL 54 06/20/2021   CHOLHDL 2.5 06/20/2021   VLDL 20 11/01/2010   LDLCALC 70 06/20/2021   LDLCALC 133 (H) 03/22/2021     Current Medications: Current Outpatient Medications  Medication Sig Dispense Refill   amLODipine (NORVASC) 10 MG tablet Take 1 tablet (10 mg total) by mouth daily. 90 tablet 3   calcium carbonate (OS-CAL) 600 MG TABS tablet Take 1 tablet (600 mg total) by mouth 2 (two)  times daily with a meal. 180 tablet 3   cetirizine (ZYRTEC) 10 MG tablet Take 1 tablet (10 mg total) by mouth at bedtime. 90 tablet 3   Cholecalciferol (VITAMIN D) 50 MCG (  2000 UT) tablet Take 1 tablet (2,000 Units total) by mouth daily. 90 tablet 3   diclofenac Sodium (VOLTAREN) 1 % GEL Apply 4 g topically 4 (four) times daily. 150 g 3   gabapentin (NEURONTIN) 600 MG tablet Take 1 tablet (600 mg total) by mouth 2 (two) times daily. 180 tablet 3   lisinopril-hydrochlorothiazide (ZESTORETIC) 20-25 MG tablet Take 2 tablets by mouth daily. 180 tablet 3   naproxen sodium (ALEVE) 220 MG tablet Take 220 mg by mouth.     rosuvastatin (CRESTOR) 20 MG tablet Take 1 tablet (20 mg total) by mouth daily. 90 tablet 3   sertraline (ZOLOFT) 100 MG tablet 1  qam 60 tablet 5   traZODone (DESYREL) 50 MG tablet TAKE 2 TABLETS BY MOUTH EVERY NIGHT AT BEDTIME 60 tablet 8   vitamin B-12 (CYANOCOBALAMIN) 1000 MCG tablet Take 1 tablet (1,000 mcg total) by mouth daily. 30 tablet 0   No current facility-administered medications for this visit.    Neurologic: Headache: No Seizure: No Paresthesias:No  Musculoskeletal: Strength & Muscle Tone: within normal limits Gait & Station: normal Patient leans: N/A  Psychiatric Specialty Exam: ROS  There were no vitals taken for this visit.There is no height or weight on file to calculate BMI.  General Appearance: Casual  Eye Contact:  Fair  Speech:  Clear and Coherent  Volume:  Normal  Mood:  Depressed  Affect:  Blunt  Thought Process:  Goal Directed  Orientation:  Full (Time, Place, and Person)  Thought Content:  WDL  Suicidal Thoughts:  No  Homicidal Thoughts:  No  Memory:  NA  Judgement:  Good  Insight:  Good  Psychomotor Activity:  Normal  Concentration:    Recall:  [place] of Knowledge:Good  Language: Poor  Akathisia:  No  Handed:  Right  AIMS (if indicated):    Assets:  Desire for Improvement  ADL's:  Intact  Cognition: WNL  Sleep:       Treatment Plan Summary: 10/12/20223:54 PM    Today the patient is diagnosed with major depression and takes Zoloft 100 mg.  At this time I would not change that.  The patient takes trazodone for sleep and was given the warning about not getting up quickly.  She is sleeping and eating well and her mood is fairly stable.  Today we will give her referrals for a therapist.  The patient return to see me in 3 to 4 months.  I believe the patient is doing fairly well.

## 2021-09-07 NOTE — Progress Notes (Signed)
Psychiatric Initial Adult Assessment   Patient Identification: Mia Underwood MRN:  [id number] Date of Evaluation:  09/07/2021 Referral Source: Eustaquio Maize [place] Chief Complaint: Clinical depression  Today the patient is actually doing fairly well.  She has not been seen in over a year.  The only thing that happened is she has had a lot of deaths in the last 8 months.  Her mother died and then 3).  The patient seems to be handling it fairly well.  She lives with her granddaughter who is a Paramedic in [employer].  She seems to be doing okay.  The patient is sleeping and eating well.  She is got good energy.  She has no problems thinking and concentrating.  She is enjoying life as best she can.  She has hypertension which is now well controlled and she is working.  The patient drinks no alcohol and uses no drugs.  She is very articulate and very interactive.  She is interested in getting back into therapy.  She will call our center in the next week and find out who she can see.  For now she is not anhedonic and she is not depressed.  She denies any significant anxiety.  She has no evidence of any psychosis.  She is actually functioning fairly well.  She still drives and is very active. Past Psychiatric History:   Previous Psychotropic Medications: Yes   Substance Abuse History in the last 12 months:  Yes.    Consequences of Substance Abuse:   Past Medical History:  Past Medical History:  Diagnosis Date   Allergic rhinitis 05/04/2016   Arthritis    Bilateral carpal tunnel syndrome 10/18/2010   Left > Right    Colles' fracture of left radius 07/01/2019   Diverticulosis of sigmoid colon 06/07/2010   Essential hypertension 09/14/2009   Hyperlipidemia 09/14/2009   Insomnia secondary to depression with anxiety 05/04/2016   Major depression, recurrent, chronic ([place]) 09/14/2009   Marijuana abuse 04/04/2016   Tobacco abuse 01/17/2016   Upper respiratory infection, viral 02/03/2019   Vitamin D deficiency 04/04/2016     Past Surgical History:  Procedure Laterality Date   ABDOMINAL HYSTERECTOMY  April 1996   For fibroids   COLONOSCOPY  07/18/2010   Patterson-tic's    Family Psychiatric History:   Family History:  Family History  Problem Relation Age of Onset   Breast cancer Mother    Depression Mother    Hypertension Mother    Dementia Mother    Alcoholism Father        Died in his 51's related to alcohol abuse   Leukemia Sister 8   Early death Brother        Died in a Teacher, music accident   Heart attack Daughter 21       Per patient report, specifics unknown   Diabetes Mellitus II Maternal Grandmother    Diabetes Mellitus II Paternal Grandmother    Healthy Sister    Healthy Sister    Arthritis Brother    Colon polyps Brother    Healthy Brother    Healthy Brother    Healthy Son    Breast cancer Maternal Aunt    Breast cancer Cousin    Breast cancer Maternal Aunt    Colon cancer Neg Hx    Esophageal cancer Neg Hx    Rectal cancer Neg Hx    Stomach cancer Neg Hx     Social History:   Social History   Socioeconomic History  Marital status: Single    Spouse name: Not on file   Number of children: 2   Years of education: 12 grade   Highest education level: Not on file  Occupational History   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 1pack every 8 days.  Stopped on 01/20/2016. Restarted again. Now 1 pack per week  Vaping Use   Vaping Use: Never used  Substance and Sexual Activity   Alcohol use: Yes    Alcohol/week: 2.0 standard drinks    Types: 1 Glasses of wine, 1 Cans of beer per week    Comment: drinks socially with friends   Drug use: No   Sexual activity: Not Currently  Other Topics Concern   Not on file  Social History Narrative   Graduated  from [employer] in [place].  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      Current Social History 03/01/2021   Patient lives with granddaughter in an apartment on the first floor. There are not steps up to the entrance the patient uses.       Patient's method of transportation is personal car.      The highest level of education was some college.      The patient currently retired.      Identified important Relationships are "My brother, my son"       Pets : None, but I would like to have one       Interests / Fun: Love to read, play solitaire, Therapist, nutritional with sisters       Current Stressors: My community is getting violent, strained family relationships      Religious / Personal Beliefs: Baptized [employer]       Social Determinants of Radio broadcast assistant Strain: Not on file  Food Insecurity: Not on file  Transportation Needs: Not on file  Physical Activity: Not on file  Stress: Not on file  Social Connections: Not on file    Additional Social History:   Allergies:   Allergies  Allergen Reactions   Penicillins Hives   Naproxen Rash    Pt states she takes Aleve without problems    Metabolic Disorder Labs: No results found for: HGBA1C, MPG No results found for: PROLACTIN Lab Results  Component Value Date   CHOL 136 06/20/2021   TRIG 57 06/20/2021   HDL 54 06/20/2021   CHOLHDL 2.5 06/20/2021   VLDL 20 11/01/2010   LDLCALC 70 06/20/2021   LDLCALC 133 (H) 03/22/2021     Current Medications: Current Outpatient Medications  Medication Sig Dispense Refill   amLODipine (NORVASC) 10 MG tablet Take 1 tablet (10 mg total) by mouth daily. 90 tablet 3   calcium carbonate (OS-CAL) 600 MG TABS tablet Take 1 tablet (600 mg total) by mouth 2 (two) times daily with a meal. 180 tablet 3   cetirizine (ZYRTEC) 10 MG tablet Take 1 tablet (10 mg total) by mouth at bedtime. 90 tablet 3   Cholecalciferol (VITAMIN D) 50 MCG (2000 UT)  tablet Take 1 tablet (2,000 Units total) by mouth daily. 90 tablet 3   diclofenac Sodium (VOLTAREN) 1 % GEL Apply 4 g  topically 4 (four) times daily. 150 g 3   gabapentin (NEURONTIN) 600 MG tablet Take 1 tablet (600 mg total) by mouth 2 (two) times daily. 180 tablet 3   lisinopril-hydrochlorothiazide (ZESTORETIC) 20-25 MG tablet Take 2 tablets by mouth daily. 180 tablet 3   naproxen sodium (ALEVE) 220 MG tablet Take 220 mg by mouth.     rosuvastatin (CRESTOR) 20 MG tablet Take 1 tablet (20 mg total) by mouth daily. 90 tablet 3   sertraline (ZOLOFT) 100 MG tablet 1  qam 60 tablet 5   traZODone (DESYREL) 50 MG tablet TAKE 2 TABLETS BY MOUTH EVERY NIGHT AT BEDTIME 60 tablet 8   vitamin B-12 (CYANOCOBALAMIN) 1000 MCG tablet Take 1 tablet (1,000 mcg total) by mouth daily. 30 tablet 0   No current facility-administered medications for this visit.    Neurologic: Headache: No Seizure: No Paresthesias:No  Musculoskeletal: Strength & Muscle Tone: within normal limits Gait & Station: normal Patient leans: N/A  Psychiatric Specialty Exam: ROS  There were no vitals taken for this visit.There is no height or weight on file to calculate BMI.  General Appearance: Casual  Eye Contact:  Fair  Speech:  Clear and Coherent  Volume:  Normal  Mood:  Depressed  Affect:  Blunt  Thought Process:  Goal Directed  Orientation:  Full (Time, Place, and Person)  Thought Content:  WDL  Suicidal Thoughts:  No  Homicidal Thoughts:  No  Memory:  NA  Judgement:  Good  Insight:  Good  Psychomotor Activity:  Normal  Concentration:    Recall:  [place] of Knowledge:Good  Language: Poor  Akathisia:  No  Handed:  Right  AIMS (if indicated):    Assets:  Desire for Improvement  ADL's:  Intact  Cognition: WNL  Sleep:      Treatment Plan Summary: 10/12/20223:53 PM   This patient's first problem is that of major depression.  She continues taking Zoloft 100 mg without problem.  Her second problem is  insomnia.  The patient takes 100 mg of trazodone.  Today she was given the warning about getting up slow from her bed.  Overall the patient is doing great.  She will return to see me in approximately 6 months.

## 2021-09-09 DIAGNOSIS — Z1152 Encounter for screening for COVID-19: Secondary | ICD-10-CM | POA: Diagnosis not present

## 2021-11-16 ENCOUNTER — Telehealth: Payer: Self-pay | Admitting: Behavioral Health

## 2021-11-16 DIAGNOSIS — E663 Overweight: Secondary | ICD-10-CM | POA: Diagnosis not present

## 2021-11-16 DIAGNOSIS — I10 Essential (primary) hypertension: Secondary | ICD-10-CM | POA: Diagnosis not present

## 2021-11-16 DIAGNOSIS — Z0000 Encounter for general adult medical examination without abnormal findings: Secondary | ICD-10-CM | POA: Diagnosis not present

## 2021-11-16 DIAGNOSIS — Z6828 Body mass index (BMI) 28.0-28.9, adult: Secondary | ICD-10-CM | POA: Diagnosis not present

## 2021-11-16 DIAGNOSIS — G629 Polyneuropathy, unspecified: Secondary | ICD-10-CM | POA: Diagnosis not present

## 2021-11-16 DIAGNOSIS — M779 Enthesopathy, unspecified: Secondary | ICD-10-CM | POA: Diagnosis not present

## 2021-11-16 DIAGNOSIS — R69 Illness, unspecified: Secondary | ICD-10-CM | POA: Diagnosis not present

## 2021-11-16 NOTE — Telephone Encounter (Signed)
Directed Pt in scheduling appt per Referral from Dr. Casimiro Needle @ [facility]. Pt confused about Ins coverage that does not apply to her @ [facility] Clarification given & Pt happy to take appt on 12/07/21 @ 2:30pm.  Dr. Theodis Shove

## 2021-12-01 ENCOUNTER — Other Ambulatory Visit: Payer: Self-pay | Admitting: Internal Medicine

## 2021-12-01 DIAGNOSIS — J302 Other seasonal allergic rhinitis: Secondary | ICD-10-CM

## 2021-12-01 DIAGNOSIS — R252 Cramp and spasm: Secondary | ICD-10-CM

## 2021-12-07 ENCOUNTER — Telehealth (HOSPITAL_BASED_OUTPATIENT_CLINIC_OR_DEPARTMENT_OTHER): Payer: Medicare HMO | Admitting: Psychiatry

## 2021-12-07 ENCOUNTER — Ambulatory Visit: Payer: Medicare HMO | Admitting: Behavioral Health

## 2021-12-07 ENCOUNTER — Other Ambulatory Visit: Payer: Self-pay

## 2021-12-07 DIAGNOSIS — F324 Major depressive disorder, single episode, in partial remission: Secondary | ICD-10-CM | POA: Diagnosis not present

## 2021-12-07 DIAGNOSIS — F419 Anxiety disorder, unspecified: Secondary | ICD-10-CM

## 2021-12-07 DIAGNOSIS — F331 Major depressive disorder, recurrent, moderate: Secondary | ICD-10-CM

## 2021-12-07 DIAGNOSIS — R69 Illness, unspecified: Secondary | ICD-10-CM | POA: Diagnosis not present

## 2021-12-07 MED ORDER — TRAZODONE HCL 50 MG PO TABS: ORAL_TABLET | ORAL | 8 refills | Status: DC

## 2021-12-07 MED ORDER — SERTRALINE HCL 100 MG PO TABS: ORAL_TABLET | ORAL | 5 refills | Status: DC

## 2021-12-07 NOTE — BH Specialist Note (Signed)
Integrated Behavioral Health via Telemedicine Visit  12/07/2021 ANSHIKA PETHTEL [id number]  Number of [place] visits: 1/6 Session Start time: 2:15pm  Session End time: 2:45pm Total time: 30  Referring Provider: Dr. Orie Fisherman, MD Patient/Family location: Pt is home in private [facility] Provider location: [facility] Office All persons participating in visit: Pt & Clinician Types of Service: Individual psychotherapy  I connected with [name] and/or Chauncey Reading Hartel's  self  via  Telephone or Video Enabled Telemedicine Application  (Video is Caregility application) and verified that I am speaking with the correct person using two identifiers. Discussed confidentiality: Yes   I discussed the limitations of telemedicine and the availability of in person appointments.  Discussed there is a possibility of technology failure and discussed alternative modes of communication if that failure occurs.  I discussed that engaging in this telemedicine visit, they consent to the provision of behavioral healthcare and the services will be billed under their insurance.  Patient and/or legal guardian expressed understanding and consented to Telemedicine visit: Yes   Presenting Concerns: Patient and/or family reports the following symptoms/concerns: elevated anx/dep & diappointment @ a woman friend's betrayal of her trust through gossip Duration of problem: a week; Severity of problem: moderate  Patient and/or Family's Strengths/Protective Factors: Social connections, Social and Emotional competence, Concrete supports in place (healthy food, safe environments, etc.), Sense of purpose, and spiritual resilience  Goals Addressed: Patient will:  Reduce symptoms of: anxiety, depression, and disappointment    Increase knowledge and/or ability of: coping skills, stress reduction, and spiritual strength-building    Demonstrate ability to: Increase healthy adjustment to current life  circumstances  Progress towards Goals: Ongoing  Interventions: Interventions utilized:  Solution-Focused Strategies and Supportive Counseling Standardized Assessments completed:  screeners prn  Patient and/or Family Response: Pt receptive to call today. Pt requests future calls.  Assessment: Patient currently experiencing elevated distress over the betrayal by a good friend of her confidence & trust.  Patient may benefit from cont'd ck-ins for mental health wellness.  Plan: Follow up with behavioral health clinician on : 2-3 wks on telehealth for 30 min Behavioral recommendations: None today Referral(s): [place] (In Clinic)  I discussed the assessment and treatment plan with the patient and/or parent/guardian. They were provided an opportunity to ask questions and all were answered. They agreed with the plan and demonstrated an understanding of the instructions.   They were advised to call back or seek an in-person evaluation if the symptoms worsen or if the condition fails to improve as anticipated.  Donnetta Hutching, LMFT

## 2021-12-07 NOTE — Progress Notes (Addendum)
Psychiatric Initial Adult Assessment   Patient Identification: Mia Underwood MRN:  [id number] Date of Evaluation:  12/07/2021 Referral Source: Waynetta Sandy McKinzie Chief Complaint: Clinical depression   Today the patient is not doing as well.  She describes herself as agitated.  In close evaluation is actually stated irritability.  She actually denies being depressed.  She says that things are just getting on her nerves.  Specifically people.  She says her neighbors are getting on her nerves other girlfriends are getting on her nerves.  Paradoxically her daughter who she was having problem with on her last visit is doing much better with her.  She says she is dealing with her daughters much better and feels good about that.  She does says that generally she is irritable.  The good news is she also took our advice to get into therapy and in fact had her first therapy appointment this morning.  She will start seeing the therapist on a relatively regular basis.  The patient is sleeping and eating well.  She is got good energy.  She admits to me that she maybe is not taking her Zoloft on a regular basis and promises that she will change that.  She drinks no alcohol and uses no drugs.  She has never been psychotic.  She can concentrate back to plays games on her talents all the time. Past Psychiatric History:   Previous Psychotropic Medications: Yes   Substance Abuse History in the last 12 months:  Yes.    Consequences of Substance Abuse:   Past Medical History:  Past Medical History:  Diagnosis Date   Allergic rhinitis 05/04/2016   Arthritis    Bilateral carpal tunnel syndrome 10/18/2010   Left > Right    Colles' fracture of left radius 07/01/2019   Diverticulosis of sigmoid colon 06/07/2010   Essential hypertension 09/14/2009   Hyperlipidemia 09/14/2009   Insomnia secondary to depression with anxiety 05/04/2016   Major depression, recurrent, chronic (HCC) 09/14/2009   Marijuana abuse 04/04/2016    Tobacco abuse 01/17/2016   Upper respiratory infection, viral 02/03/2019   Vitamin D deficiency 04/04/2016    Past Surgical History:  Procedure Laterality Date   ABDOMINAL HYSTERECTOMY  April 1996   For fibroids   COLONOSCOPY  07/18/2010   Patterson-tic's    Family Psychiatric History:   Family History:  Family History  Problem Relation Age of Onset   Breast cancer Mother    Depression Mother    Hypertension Mother    Dementia Mother    Alcoholism Father        Died in his 41's related to alcohol abuse   Leukemia Sister 8   Early death Brother        Died in a Librarian, academic accident   Heart attack Daughter 30       Per patient report, specifics unknown   Diabetes Mellitus II Maternal Grandmother    Diabetes Mellitus II Paternal Grandmother    Healthy Sister    Healthy Sister    Arthritis Brother    Colon polyps Brother    Healthy Brother    Healthy Brother    Healthy Son    Breast cancer Maternal Aunt    Breast cancer Cousin    Breast cancer Maternal Aunt    Colon cancer Neg Hx    Esophageal cancer Neg Hx    Rectal cancer Neg Hx    Stomach cancer Neg Hx     Social History:   Social History  Socioeconomic History   Marital status: Single    Spouse name: Not on file   Number of children: 2   Years of education: 12 grade   Highest education level: Not on file  Occupational History   Occupation: Research officer, 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[employer]    Comment: Retired  Tobacco Use   Smoking status: Some Days    Packs/day: 0.20    Types: Cigarettes    Start date: 12/02/1973   Smokeless tobacco: Never   Tobacco comments:    Stopped in 1980 restarted in 1985. 1pack every 8 days.  Stopped on 01/20/2016. Restarted again. Now 1 pack per week  Vaping Use   Vaping Use: Never used  Substance and Sexual Activity   Alcohol use: Yes    Alcohol/week: 2.0 standard drinks    Types: 1 Glasses of wine, 1 Cans of beer per week    Comment: drinks socially with friends   Drug use: No   Sexual  activity: Not Currently  Other Topics Concern   Not on file  Social History Narrative   Graduated from [employer] in [employer].  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      Current Social History 03/01/2021   Patient lives with granddaughter in an apartment on the first floor. There are not steps up to the entrance the patient uses.       Patient's method of transportation is personal car.      The highest level of education was some college.      The patient currently retired.      Identified important Relationships are "My brother, my son"       Pets : None, but I would like to have one       Interests / Fun: Love to read, play solitaire, Control and instrumentation engineer with sisters       Current Stressors: My community is getting violent, strained family relationships      Religious / Personal Beliefs: Baptized [employer]ist       Social Determinants of Corporate investment banker Strain: Not on file  Food Insecurity: Not on file  Transportation Needs: Not on file  Physical Activity: Not on file  Stress: Not on file  Social Connections: Not on file    Additional Social History:   Allergies:   Allergies  Allergen Reactions   Penicillins Hives   Naproxen Rash    Pt states she takes Aleve without problems    Metabolic Disorder Labs: No results found for: HGBA1C, MPG No results found for: PROLACTIN Lab Results  Component Value Date   CHOL 136 06/20/2021   TRIG 57 06/20/2021   HDL 54 06/20/2021   CHOLHDL 2.5 06/20/2021   VLDL 20 11/01/2010   LDLCALC 70 06/20/2021   LDLCALC 133 (H) 03/22/2021     Current Medications: Current Outpatient Medications  Medication Sig Dispense Refill   cetirizine (ZYRTEC) 10 MG tablet TAKE 1 TABLET(10 MG) BY MOUTH AT BEDTIME 90 tablet 3   gabapentin (NEURONTIN) 600 MG tablet TAKE 1 TABLET(600 MG) BY MOUTH TWICE DAILY 180 tablet 3   amLODipine (NORVASC) 10 MG tablet Take 1 tablet (10 mg total) by mouth daily. 90  tablet 3   calcium carbonate (OS-CAL) 600 MG TABS tablet Take 1 tablet (600 mg total) by mouth 2 (two) times daily with a meal. 180 tablet 3   Cholecalciferol (VITAMIN D) 50 MCG (2000 UT) tablet Take 1 tablet (  2,000 Units total) by mouth daily. 90 tablet 3   diclofenac Sodium (VOLTAREN) 1 % GEL Apply 4 g topically 4 (four) times daily. 150 g 3   lisinopril-hydrochlorothiazide (ZESTORETIC) 20-25 MG tablet Take 2 tablets by mouth daily. 180 tablet 3   naproxen sodium (ALEVE) 220 MG tablet Take 220 mg by mouth.     rosuvastatin (CRESTOR) 20 MG tablet Take 1 tablet (20 mg total) by mouth daily. 90 tablet 3   sertraline (ZOLOFT) 100 MG tablet 1  qam 60 tablet 5   traZODone (DESYREL) 50 MG tablet TAKE 2 TABLETS BY MOUTH EVERY NIGHT AT BEDTIME 60 tablet 8   vitamin B-12 (CYANOCOBALAMIN) 1000 MCG tablet Take 1 tablet (1,000 mcg total) by mouth daily. 30 tablet 0   No current facility-administered medications for this visit.    Neurologic: Headache: No Seizure: No Paresthesias:No  Musculoskeletal: Strength & Muscle Tone: within normal limits Gait & Station: normal Patient leans: N/A  Psychiatric Specialty Exam: ROS  There were no vitals taken for this visit.There is no height or weight on file to calculate BMI.  General Appearance: Casual  Eye Contact:  Fair  Speech:  Clear and Coherent  Volume:  Normal  Mood:  Depressed  Affect:  Blunt  Thought Process:  Goal Directed  Orientation:  Full (Time, Place, and Person)  Thought Content:  WDL  Suicidal Thoughts:  No  Homicidal Thoughts:  No  Memory:  NA  Judgement:  Good  Insight:  Good  Psychomotor Activity:  Normal  Concentration:    Recall:  Fair  Fund of Knowledge:Good  Language: Poor  Akathisia:  No  Handed:  Right  AIMS (if indicated):    Assets:  Desire for Improvement  ADL's:  Intact  Cognition: WNL  Sleep:     Virtual Visit via Telephone Note  I connected with JOZALYN WALKENHORST on 08/14/23 at  4:00 PM EST by telephone  and verified that I am speaking with the correct person using two identifiers.  Location: Patient: home Provider: office   I discussed the limitations, risks, security and privacy concerns of performing an evaluation and management service by telephone and the availability of in person appointments. I also discussed with the patient that there may be a patient responsible charge related to this service. The patient expressed understanding and agreed to proceed.    I discussed the assessment and treatment plan with the patient. The patient was provided an opportunity to ask questions and all were answered. The patient agreed with the plan and demonstrated an understanding of the instructions.   The patient was advised to call back or seek an in-person evaluation if the symptoms worsen or if the condition fails to improve as anticipated.  I provided 30 minutes of non-face-to-face time during this encounter.   Gypsy Balsam, MD  Treatment Plan Summary: 1/11/20234:30 PM    This patient's first problem is that of major depression.  She will continue taking Zoloft 100 mg.  She has a problem with sleep and takes trazodone which works well.  The most important intervention is to support her in her starting of psychotherapy.  We will make contact with her to see [place] again in approximately 2 to 3 months.  Patient is functioning very well.  She is very independent.

## 2021-12-08 ENCOUNTER — Ambulatory Visit (HOSPITAL_COMMUNITY): Payer: Medicare HMO | Admitting: Psychiatry

## 2021-12-27 ENCOUNTER — Ambulatory Visit (INDEPENDENT_AMBULATORY_CARE_PROVIDER_SITE_OTHER): Payer: Medicare HMO | Admitting: Internal Medicine

## 2021-12-27 ENCOUNTER — Encounter: Payer: Self-pay | Admitting: Internal Medicine

## 2021-12-27 VITALS — BP 162/73 | HR 73 | Temp 99.2°F | Ht 60.0 in | Wt 145.9 lb

## 2021-12-27 DIAGNOSIS — Z23 Encounter for immunization: Secondary | ICD-10-CM

## 2021-12-27 DIAGNOSIS — E7800 Pure hypercholesterolemia, unspecified: Secondary | ICD-10-CM | POA: Diagnosis not present

## 2021-12-27 DIAGNOSIS — I10 Essential (primary) hypertension: Secondary | ICD-10-CM

## 2021-12-27 DIAGNOSIS — K5730 Diverticulosis of large intestine without perforation or abscess without bleeding: Secondary | ICD-10-CM

## 2021-12-27 DIAGNOSIS — E559 Vitamin D deficiency, unspecified: Secondary | ICD-10-CM | POA: Diagnosis not present

## 2021-12-27 DIAGNOSIS — Z720 Tobacco use: Secondary | ICD-10-CM

## 2021-12-27 DIAGNOSIS — F339 Major depressive disorder, recurrent, unspecified: Secondary | ICD-10-CM

## 2021-12-27 DIAGNOSIS — F418 Other specified anxiety disorders: Secondary | ICD-10-CM | POA: Diagnosis not present

## 2021-12-27 DIAGNOSIS — R69 Illness, unspecified: Secondary | ICD-10-CM | POA: Diagnosis not present

## 2021-12-27 DIAGNOSIS — F5105 Insomnia due to other mental disorder: Secondary | ICD-10-CM

## 2021-12-27 DIAGNOSIS — M8580 Other specified disorders of bone density and structure, unspecified site: Secondary | ICD-10-CM | POA: Diagnosis not present

## 2021-12-27 DIAGNOSIS — Z0000 Encounter for general adult medical examination without abnormal findings: Secondary | ICD-10-CM

## 2021-12-27 DIAGNOSIS — G5792 Unspecified mononeuropathy of left lower limb: Secondary | ICD-10-CM | POA: Diagnosis not present

## 2021-12-27 MED ORDER — LISINOPRIL-HYDROCHLOROTHIAZIDE 20-25 MG PO TABS: 2.0000 | ORAL_TABLET | Freq: Every day | ORAL | 3 refills | Status: DC

## 2021-12-27 MED ORDER — AMLODIPINE BESYLATE 10 MG PO TABS: 10.0000 mg | ORAL_TABLET | Freq: Every day | ORAL | 3 refills | Status: DC

## 2021-12-27 MED ORDER — ROSUVASTATIN CALCIUM 20 MG PO TABS: 20.0000 mg | ORAL_TABLET | Freq: Every day | ORAL | 3 refills | Status: DC

## 2021-12-27 NOTE — Assessment & Plan Note (Signed)
-   This problem is chronic and stable -Patient does have a history of osteopenia and is on vitamin D supplementation for this -Patient does not require calcium supplementation at this time -We will repeat DEXA scan this year -No further work-up for now

## 2021-12-27 NOTE — Patient Instructions (Addendum)
-  It was a pleasure seeing you again today -Please continue to follow-up with Dr. Carolynne Edouard and Dr. Casimiro Needle.  I do think that they will be able to help you with your current issues including agitation -I have put in refills for your blood pressure medications and cholesterol medication.  Please call me for any questions or concerns or difficulty refilling these medications -Please continue with your vitamin B12 and vitamin D -Please try to stop smoking again -We will give you a flu shot today -Please obtain COVID booster and shingles vaccine at your pharmacy -Please call me with any questions or concerns

## 2021-12-27 NOTE — Assessment & Plan Note (Signed)
-   Patient denies any recurrent neuropathic symptoms -We will continue vitamin B12 supplementation -We will repeat vitamin B12 level at her next lab visit

## 2021-12-27 NOTE — Assessment & Plan Note (Signed)
-   Patient states that she did quit smoking in August but has had a lot of family issues this month and is started smoking again -Patient states that she smokes  1 pack of cigarettes every 1 to 2 weeks -I explained to the patient that it is important to quit smoking and the health hazards that it can cause -She expressed understanding and states that she will attempt to do this on her own -We will follow-up with her at her next visit

## 2021-12-27 NOTE — Assessment & Plan Note (Signed)
-   This problem is chronic and stable -Patient has only been intermittently compliant with her rosuvastatin and the bottle that she has today was filled in May of last year -I have put in a refill for her rosuvastatin today -We will recheck a lipid panel at her next visit

## 2021-12-27 NOTE — Assessment & Plan Note (Signed)
BP Readings from Last 3 Encounters:  12/27/21 (!) 162/73  06/20/21 134/81  03/22/21 (!) 143/62    Lab Results  Component Value Date   NA 143 06/20/2021   K 4.0 06/20/2021   CREATININE 0.65 06/20/2021    Assessment: Blood pressure control:  Uncontrolled Progress toward BP goal:   Deteriorated Comments: Patient states that she has been only intermittently compliant with her medications and has not taken her amlodipine in a while.  She is supposed to be on lisinopril/HCTZ 20/25 mg 2 tabs daily as well as amlodipine 10 mg daily  Plan: Medications:  continue current medications Educational resources provided:   Self management tools provided:   Other plans: We will send in refills for her blood pressure medications today and recheck a BMP at her next visit in 1 month.  Patient to follow-up in 1 month for blood pressure follow-up

## 2021-12-27 NOTE — Assessment & Plan Note (Signed)
-   This problem is chronic and stable -Patient is compliant with her vitamin D supplementation -Her last vitamin D level was within normal limits -No further work-up at this time

## 2021-12-27 NOTE — Assessment & Plan Note (Signed)
-   Flu shot given today -Patient states that she will get her COVID booster and shingles vaccine at her pharmacy -She will need repeat DEXA scan this year -No further work-up for now

## 2021-12-27 NOTE — Assessment & Plan Note (Signed)
-   Patient had a colonoscopy done last year which showed 2 polyps in the transverse colon and cecum -She will need repeat colonoscopy in 5 to 10 years -No further work-up at this time

## 2021-12-27 NOTE — Assessment & Plan Note (Signed)
-   Patient does have a history of insomnia associated with her depression anxiety -She follows up with Dr. Casimiro Needle for this -Patient states that she is trazodone as needed -No further work-up at this time

## 2021-12-27 NOTE — Progress Notes (Signed)
° °  Subjective:    Patient ID: Mia Underwood, female    DOB: 13-Nov-1951, 71 y.o.   MRN: [id number]  Hypertension   I have seen and examined this patient.  Patient is here for routine follow-up of her hypertension and depression.  Patient states that she has only been intermittently compliant with her medication secondary to agitation from her family.  She also complains of intermittent cramping in her hands and legs but otherwise feels well.   Review of Systems  Constitutional: Negative.   HENT: Negative.    Respiratory: Negative.    Cardiovascular: Negative.   Gastrointestinal: Negative.   Musculoskeletal: Negative.   Neurological: Negative.   Psychiatric/Behavioral: Negative.        Objective:   Physical Exam Constitutional:      Appearance: Normal appearance.  HENT:     Head: Normocephalic and atraumatic.  Cardiovascular:     Rate and Rhythm: Normal rate and regular rhythm.     Heart sounds: Normal heart sounds.  Pulmonary:     Effort: No respiratory distress.     Breath sounds: Normal breath sounds. No wheezing.  Abdominal:     General: Bowel sounds are normal. There is no distension.     Palpations: Abdomen is soft.     Tenderness: There is no abdominal tenderness.  Musculoskeletal:        General: No swelling or tenderness.     Cervical back: Neck supple.  Lymphadenopathy:     Cervical: No cervical adenopathy.  Neurological:     Mental Status: She is alert and oriented to person, place, and time.  Psychiatric:        Mood and Affect: Mood normal.        Behavior: Behavior normal.          Assessment & Plan:   Please see problem based charting for assessment and plan:

## 2021-12-27 NOTE — Assessment & Plan Note (Signed)
-   This problem is chronic and worsening -Patient PHQ-9 score today is up to 15 -Patient states that she has had a lot of issues with her family and has had episodes of agitation secondary to this.   -She states that she has been following up with Dr. Casimiro Needle and Dr. Theodis Shove and hopes that they will be able to help her with this -We will continue with Zoloft for now per Dr. Casimiro Needle -We had an extensive discussion today about this and the need for her to remain compliant with her medications even when she is agitated as this can affect her overall health -Patient expressed understanding and is in agreement with plan -She has a next appoint with Dr. Theodis Shove in February and Dr. Casimiro Needle in April

## 2022-01-02 ENCOUNTER — Ambulatory Visit: Payer: Medicare HMO | Admitting: Behavioral Health

## 2022-01-02 DIAGNOSIS — F331 Major depressive disorder, recurrent, moderate: Secondary | ICD-10-CM

## 2022-01-02 DIAGNOSIS — F419 Anxiety disorder, unspecified: Secondary | ICD-10-CM

## 2022-01-02 NOTE — BH Specialist Note (Signed)
Integrated Behavioral Health Follow Up In-Person Visit  MRN: [id number] Name: Mia Underwood  Number of [place] Clinician visits: 2/6 Session Start time: 2:30pm  Session End time: 3:30pm Total time: 60 minutes  Types of Service: Individual psychotherapy  Interpretor:No. Interpretor Name and Language: n/a   Subjective: Mia Underwood is a 71 y.o. female accompanied by  self Patient was referred by Dr. Orie Fisherman, MD for anx/dep & stressors in the Family. Pt c/o constant worry for her Gchildren & Family as they begin to make dec that impact their futures. 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 Dtr suffered 2 CVA incidents & died after a month of hosp'tzn. Duration of problem: 6 yrs; Severity of problem: moderate  Objective: Mood: Anxious, Depressed, and stressfulness about the younger generation  and Affect: Appropriate Risk of harm to self or others: No plan to harm self or others  Life Context: Family and Social: Pt is raising her 70yo Gdtr in her home. She also cares for the rest of her Family in many ways. Pt has reached her limits w/the expectations Family hold as she tries to lead her own best life. School/Work: Pt currently stays busy in her home & does not attend school.  Self-Care: Pt cares for her own needs but recently suffered an extended 3 day episode of anx/dep. She could hardly care for herself, nor did she want to get out of bed. Life Changes: The constellation of Pt's Family has changed in the past 5-7 yrs w/births & deaths. Pt has lost a Bros, her Dtr, & other older relatives that have impacted her own life.  Patient and/or Family's Strengths/Protective Factors: Social connections, Social and Patent attorney, Concrete supports in place (healthy food, safe environments, etc.), Sense of purpose, and Physical Health (exercise, healthy diet, medication compliance,  etc.)  Goals Addressed: Patient will:  Reduce symptoms of: anxiety, depression, and stress & manage sleep hygiene  Increase knowledge and/or ability of: coping skills, healthy habits, and stress reduction   Demonstrate ability to: Increase healthy adjustment to current life circumstances and improve boundaries w/Family members to fit the reality of her own life, age, & circumstances  Progress towards Goals: Ongoing  Interventions: Interventions utilized:  Solution-Focused Strategies, Medication Monitoring, and Supportive Counseling Standardized Assessments completed:  screeners prn  Patient and/or Family Response: Pt receptive to discussion today in our visit. Pt scheduled for first available session in 02-18-23. Pt hopes for Clinician to collaborate w/her PCP & her Psychiatrist.   Patient Centered Plan: Patient is on the following Treatment Plan(s): Call Psychiatrist & place self on Wait List for earlier appt than April. Use suggestions for boundary-setting that serve your current needs w/Family. Assessment: Patient currently experiencing inc in anx/dep as she worries for the state of her Family. Pt expects respect & gratefulness from her Family, esp'l Lenon Ahmadi as the needs inc w/more babies.  Patient may benefit from cont'd support for her [place] her place as the [place].  Plan: Follow up with behavioral health clinician on : Beginning of Feb 18, 2023 for f:f for 60 min Behavioral recommendations: Follow suggestions provided for keeping your brain active.  Referral(s): [place] (In Clinic) "From scale of 1-10, how likely are you to follow plan?": [place], LMFT

## 2022-01-24 ENCOUNTER — Encounter: Payer: Medicare HMO | Admitting: Internal Medicine

## 2022-01-26 ENCOUNTER — Institutional Professional Consult (permissible substitution): Payer: Medicare HMO | Admitting: Behavioral Health

## 2022-01-31 ENCOUNTER — Encounter: Payer: Medicare HMO | Admitting: Internal Medicine

## 2022-02-14 ENCOUNTER — Institutional Professional Consult (permissible substitution): Payer: Medicare HMO | Admitting: Behavioral Health

## 2022-03-07 ENCOUNTER — Telehealth (HOSPITAL_BASED_OUTPATIENT_CLINIC_OR_DEPARTMENT_OTHER): Payer: Medicare HMO | Admitting: Psychiatry

## 2022-03-07 DIAGNOSIS — F325 Major depressive disorder, single episode, in full remission: Secondary | ICD-10-CM

## 2022-03-07 DIAGNOSIS — R69 Illness, unspecified: Secondary | ICD-10-CM | POA: Diagnosis not present

## 2022-03-07 MED ORDER — SERTRALINE HCL 100 MG PO TABS: ORAL_TABLET | ORAL | 5 refills | Status: DC

## 2022-03-07 MED ORDER — TRAZODONE HCL 50 MG PO TABS: ORAL_TABLET | ORAL | 8 refills | Status: DC

## 2022-03-07 NOTE — Progress Notes (Signed)
Psychiatric Initial Adult Assessment  ? ?Patient Identification: Mia Underwood ?MRN:  [id number] ?Date of Evaluation:  03/07/2022 ?Referral Source: Beth McKinzie ?Chief Complaint: Clinical depression ? ?Today the patient seems to be doing fairly well.  We have unfortunately had some technical difficulties.  Apparently her phone was somewhat defective.  We had a hard time communicating.  But in general she says she is doing well.  She denies being depressed.  She takes her Zoloft as prescribed and her trazodone.  Generally she is sleeping and eating well.  He was very hard to communicate but she says she is getting along well as best she can. ?Virtual Visit via Telephone Note ? ?I connected with Mia Underwood on 03/07/22 at  3:00 PM EDT by telephone and verified that I am speaking with the correct person using two identifiers. ? ?Location: ?Patient: home ?Provider: office ?  ?I discussed the limitations, risks, security and privacy concerns of performing an evaluation and management service by telephone and the availability of in person appointments. I also discussed with the patient that there may be a patient responsible charge related to this service. The patient expressed understanding and agreed to proceed. ? ?ructions: ? ?  ?I discussed the assessment and treatment plan with the patient. The patient was provided an opportunity to ask questions and all were answered. The patient agreed with the plan and demonstrated an understanding of the instructions. ?  ?The patient was advised to call back or seek an in-person evaluation if the symptoms worsen or if the condition fails to improve as anticipated. ? ?I provided 15 minutes of non-face-to-face time during this encounter. ? ? ?Jerral Ralph, MD  ?Past Psychiatric History:  ? ?Previous Psychotropic Medications: Yes  ? ?Substance Abuse History in the last 12 months:  Yes.   ? ?Consequences of Substance Abuse: ? ? ?Past Medical History:  ?Past Medical History:   ?Diagnosis Date  ? Allergic rhinitis 05/04/2016  ? Arthritis   ? Bilateral carpal tunnel syndrome 10/18/2010  ? Left > Right   ? Colles' fracture of left radius 07/01/2019  ? Diverticulosis of sigmoid colon 06/07/2010  ? Essential hypertension 09/14/2009  ? Hyperlipidemia 09/14/2009  ? Insomnia secondary to depression with anxiety 05/04/2016  ? Major depression, recurrent, chronic ([place]) 09/14/2009  ? Marijuana abuse 04/04/2016  ? Muscle spasm of left shoulder 02/03/2019  ? Tobacco abuse 01/17/2016  ? Upper respiratory infection, viral 02/03/2019  ? Vitamin D deficiency 04/04/2016  ?  ?Past Surgical History:  ?Procedure Laterality Date  ? ABDOMINAL HYSTERECTOMY  April 1996  ? For fibroids  ? COLONOSCOPY  07/18/2010  ? Patterson-tic's  ? ? ?Family Psychiatric History:  ? ?Family History:  ?Family History  ?Problem Relation Age of Onset  ? Breast cancer Mother   ? Depression Mother   ? Hypertension Mother   ? Dementia Mother   ? Alcoholism Father   ?     Died in his 03/08/23 related to alcohol abuse  ? Leukemia Sister 8  ? Early death Brother   ?     Died in a motor vehicle accident  ? Heart attack Daughter 96  ?     Per patient report, specifics unknown  ? Diabetes Mellitus II Maternal Grandmother   ? Diabetes Mellitus II Paternal Grandmother   ? Healthy Sister   ? Healthy Sister   ? Arthritis Brother   ? Colon polyps Brother   ? Healthy Brother   ? Healthy Brother   ?  Healthy Son   ? Breast cancer Maternal Aunt   ? Breast cancer Cousin   ? Breast cancer Maternal Aunt   ? Colon cancer Neg Hx   ? Esophageal cancer Neg Hx   ? Rectal cancer Neg Hx   ? Stomach cancer Neg Hx   ? ? ?Social History:   ?Social History  ? ?Socioeconomic History  ? Marital status: Single  ?  Spouse name: Not on file  ? Number of children: 2  ? Years of education: 12 grade  ? Highest education level: Not on file  ?Occupational History  ? Occupation: Ambulance person  ?  Employer: [employer]  ?  Comment: Retired  ?Tobacco Use  ? Smoking status: Some Days  ?  Packs/day:  0.20  ?  Types: Cigarettes  ?  Start date: 12/02/1973  ? Smokeless tobacco: Never  ? Tobacco comments:  ?  Stopped in 1980 restarted in 1985. 1pack every 8 days.  Stopped on 01/20/2016. Restarted again. Now 1 pack per week  ?Vaping Use  ? Vaping Use: Never used  ?Substance and Sexual Activity  ? Alcohol use: Yes  ?  Alcohol/week: 2.0 standard drinks  ?  Types: 1 Glasses of wine, 1 Cans of beer per week  ?  Comment: drinks socially with friends  ? Drug use: No  ? Sexual activity: Not Currently  ?Other Topics Concern  ? Not on file  ?Social History Narrative  ? Graduated from [employer] in [place].  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  ?   ? Current Social History 03/01/2021  ? Patient lives with granddaughter in an apartment on the first floor. There are not steps up to the entrance the patient uses.   ?   ? Patient's method of transportation is personal car.  ?   ? The highest level of education was some college.  ?   ? The patient currently retired.  ?   ? Identified important Relationships are "My brother, my son"   ?   ? Pets : None, but I would like to have one  ?    ? Interests / Fun: Love to read, play solitaire, small get-togethers with sisters   ?   ? Current Stressors: My community is getting violent, strained family relationships  ?   ? Religious / Personal Beliefs: Baptized [employer]   ?   ? ?Social Determinants of Health  ? ?Financial Resource Strain: Not on file  ?Food Insecurity: Not on file  ?Transportation Needs: Not on file  ?Physical Activity: Not on file  ?Stress: Not on file  ?Social Connections: Not on file  ? ? ?Additional Social History:  ? ?Allergies:   ?Allergies  ?Allergen Reactions  ? Penicillins Hives  ? Naproxen Rash  ?  Pt states she takes Aleve without problems  ? ? ?Metabolic Disorder Labs: ?No results found for: HGBA1C, MPG ?No results found for: PROLACTIN ?Lab Results  ?Component Value Date  ? CHOL 136 06/20/2021  ? TRIG 57 06/20/2021  ? HDL 54  06/20/2021  ? CHOLHDL 2.5 06/20/2021  ? VLDL 20 11/01/2010  ? [place] 70 06/20/2021  ? [place] 133 (H) 03/22/2021  ? ? ? ?Current Medications: ?Current Outpatient Medications  ?Medication Sig Dispense Refill  ? cetirizine (ZYRTEC) 10 MG tablet TAKE 1 TABLET(10 MG) BY MOUTH AT BEDTIME 90 tablet 3  ? gabapentin (NEURONTIN) 600 MG tablet TAKE 1 TABLET(600 MG) BY MOUTH TWICE DAILY  180 tablet 3  ? amLODipine (NORVASC) 10 MG tablet Take 1 tablet (10 mg total) by mouth daily. 90 tablet 3  ? calcium carbonate (OS-CAL) 600 MG TABS tablet Take 1 tablet (600 mg total) by mouth 2 (two) times daily with a meal. 180 tablet 3  ? Cholecalciferol (VITAMIN D) 50 MCG (2000 UT) tablet Take 1 tablet (2,000 Units total) by mouth daily. 90 tablet 3  ? diclofenac Sodium (VOLTAREN) 1 % GEL Apply 4 g topically 4 (four) times daily. 150 g 3  ? lisinopril-hydrochlorothiazide (ZESTORETIC) 20-25 MG tablet Take 2 tablets by mouth daily. 180 tablet 3  ? rosuvastatin (CRESTOR) 20 MG tablet Take 1 tablet (20 mg total) by mouth daily. 90 tablet 3  ? sertraline (ZOLOFT) 100 MG tablet 1  qam 60 tablet 5  ? traZODone (DESYREL) 50 MG tablet TAKE 2 TABLETS BY MOUTH EVERY NIGHT AT BEDTIME 60 tablet 8  ? vitamin B-12 (CYANOCOBALAMIN) 1000 MCG tablet Take 1 tablet (1,000 mcg total) by mouth daily. 30 tablet 0  ? ?No current facility-administered medications for this visit.  ? ? ?Neurologic: ?Headache: No ?Seizure: No ?Paresthesias:No ? ?Musculoskeletal: ?Strength & Muscle Tone: within normal limits ?Gait & Station: normal ?Patient leans: N/A ? ?Psychiatric Specialty Exam: ?ROS  ?There were no vitals taken for this visit.There is no height or weight on file to calculate BMI.  ?General Appearance: Casual  ?Eye Contact:  Fair  ?Speech:  Clear and Coherent  ?Volume:  Normal  ?Mood:  Depressed  ?Affect:  Blunt  ?Thought Process:  Goal Directed  ?Orientation:  Full (Time, Place, and Person)  ?Thought Content:  WDL  ?Suicidal Thoughts:  No  ?Homicidal Thoughts:  No   ?Memory:  NA  ?Judgement:  Good  ?Insight:  Good  ?Psychomotor Activity:  Normal  ?Concentration:    ?Recall:  Fair  ?[employer] [place]  ?Language: Poor  ?Akathisia:  No  ?Handed:  Right  ?AIMS (if indic

## 2022-03-08 ENCOUNTER — Telehealth (HOSPITAL_COMMUNITY): Payer: Medicare HMO | Admitting: Psychiatry

## 2022-03-15 ENCOUNTER — Ambulatory Visit: Payer: Medicare HMO | Admitting: Behavioral Health

## 2022-03-15 DIAGNOSIS — F419 Anxiety disorder, unspecified: Secondary | ICD-10-CM

## 2022-03-15 DIAGNOSIS — F331 Major depressive disorder, recurrent, moderate: Secondary | ICD-10-CM

## 2022-03-15 NOTE — BH Specialist Note (Signed)
Integrated Behavioral Health via Telemedicine Visit ? ?03/15/2022 ?DEBROAH SHUTTLEWORTH ?[id number] ? ?Number of [place] Clinician visits: 3 ?Session Start time: 1000 ?Session End time: 1027 ?Total time in minutes: 30 min ? ?Referring Provider: Dr. Orie Fisherman, MD ?Patient/Family location: Pt is home in private ?[facility] Provider location: [facility] ?All persons participating in visit: Pt & Clinician ?Types of Service: Individual psychotherapy ? ?I connected with [name] and/or Chauncey Reading Mash's  self  via  Telephone or Video Enabled Telemedicine Application  (Video is Caregility application) and verified that I am speaking with the correct person using two identifiers. Discussed confidentiality: Yes  ? ?I discussed the limitations of telemedicine and the availability of in person appointments.  Discussed there is a possibility of technology failure and discussed alternative modes of communication if that failure occurs. ? ?I discussed that engaging in this telemedicine visit, they consent to the provision of behavioral healthcare and the services will be billed under their insurance. ? ?Patient and/or legal guardian expressed understanding and consented to Telemedicine visit: Yes  ? ?Presenting Concerns: ?Patient and/or family reports the following symptoms/concerns: "life is good & can get mundane", Pt is accepting of her level of anx & worry. She realizes there are things she cannot control & her worry over Family life may just be her state of mind. ?Duration of problem: months; Severity of problem: mild ? ?Patient and/or Family's Strengths/Protective Factors: ?Social and Emotional competence, Concrete supports in place (healthy food, safe environments, etc.), Sense of purpose, and Physical Health (exercise, healthy diet, medication compliance, etc.) ? ?Goals Addressed: ?Patient will: ? Reduce symptoms of: anxiety, depression, and intermittent agitation due to lack of control over life   ?  Increase knowledge and/or ability of: coping skills and reflective listening to encourage & support   ? Demonstrate ability to: Increase healthy adjustment to current life circumstances ? ?Progress towards Goals: ?Ongoing ? ?Interventions: ?Interventions utilized:  Supportive Counseling and Supportive Reflection ?Standardized Assessments completed: Not Needed ? ?Patient and/or Family Response: Pt receptive to call today & open to ck-in calls monthly ? ?Assessment: ?Patient currently experiencing reduction in anx/dep bc she has reconciled her circumstances & life situation. ? ?Patient may benefit from cont'd ck-in calls for mental health wellness. ? ?Plan: ?Follow up with behavioral health clinician on : one month out for 30 min telehealth ?Behavioral recommendations: None today ?Referral(s): [place] (In Clinic) ? ?I discussed the assessment and treatment plan with the patient and/or parent/guardian. They were provided an opportunity to ask questions and all were answered. They agreed with the plan and demonstrated an understanding of the instructions. ?  ?They were advised to call back or seek an in-person evaluation if the symptoms worsen or if the condition fails to improve as anticipated. ? ?Donnetta Hutching, LMFT ?

## 2022-03-17 ENCOUNTER — Ambulatory Visit (HOSPITAL_COMMUNITY): Payer: Medicare HMO | Admitting: Psychiatry

## 2022-04-04 ENCOUNTER — Ambulatory Visit (INDEPENDENT_AMBULATORY_CARE_PROVIDER_SITE_OTHER): Payer: Medicare HMO | Admitting: Internal Medicine

## 2022-04-04 VITALS — BP 149/71 | HR 66 | Temp 99.8°F | Ht 60.0 in | Wt 148.6 lb

## 2022-04-04 DIAGNOSIS — E559 Vitamin D deficiency, unspecified: Secondary | ICD-10-CM | POA: Diagnosis not present

## 2022-04-04 DIAGNOSIS — Z780 Asymptomatic menopausal state: Secondary | ICD-10-CM

## 2022-04-04 DIAGNOSIS — R252 Cramp and spasm: Secondary | ICD-10-CM | POA: Diagnosis not present

## 2022-04-04 DIAGNOSIS — E7800 Pure hypercholesterolemia, unspecified: Secondary | ICD-10-CM

## 2022-04-04 DIAGNOSIS — R69 Illness, unspecified: Secondary | ICD-10-CM | POA: Diagnosis not present

## 2022-04-04 DIAGNOSIS — G5792 Unspecified mononeuropathy of left lower limb: Secondary | ICD-10-CM

## 2022-04-04 DIAGNOSIS — F17210 Nicotine dependence, cigarettes, uncomplicated: Secondary | ICD-10-CM

## 2022-04-04 DIAGNOSIS — I10 Essential (primary) hypertension: Secondary | ICD-10-CM | POA: Diagnosis not present

## 2022-04-04 DIAGNOSIS — F339 Major depressive disorder, recurrent, unspecified: Secondary | ICD-10-CM

## 2022-04-04 DIAGNOSIS — Z0000 Encounter for general adult medical examination without abnormal findings: Secondary | ICD-10-CM

## 2022-04-04 DIAGNOSIS — M8580 Other specified disorders of bone density and structure, unspecified site: Secondary | ICD-10-CM | POA: Diagnosis not present

## 2022-04-04 DIAGNOSIS — Z720 Tobacco use: Secondary | ICD-10-CM

## 2022-04-04 NOTE — Assessment & Plan Note (Addendum)
Patient's blood pressure is currently uncontrolled on 3 first line medication treatments. Elevated to 150/74 on initial check in clinic today, and repeat blood pressure at the end of the visit was 149/71. Patient said she dislikes taking so many medications, but when asked about willingness to consider lifestyle changes such as quitting smoking and limiting salt intake, she said she is not interested. She says she takes her blood pressure medicine "most days", so it is difficult to consider changes at this time. May consider splitting lisinopril-HCTZ into Lisinopril '20mg'$  BID and HCTZ '25mg'$  once daily because '50mg'$  daily of HCTZ is a high dose that will only have limited blood pressure benefit. ? ?Plan ?- Continue amlodipine '10mg'$  daily ?- Continue lisinopril-HCTZ 20-'25mg'$  2 tablets daily  ?- Recommended she take her medications daily for best BP control ?- Follow-up CMP results ?- Recommended stopping potassium supplement ?

## 2022-04-04 NOTE — Assessment & Plan Note (Addendum)
Patient says she has not been experiencing any neuropathic symptoms. She says the numbness and tingling of the LLE only occurs in the winter months. She denies any numbness and tingling in RLE and arms. She continues taking gabapentin '600mg'$ , and she says she is supposed to pick up a refill today. Patient also continues taking Vit B12 supplementation, so we will check B12 level today. Will continue to monitor. ? ?Plan ?- Follow-up Vit B12 results ?- Continue gabapentin '600mg'$  daily ?- Continue Vitamin B12 1038mg daily ?

## 2022-04-04 NOTE — Assessment & Plan Note (Addendum)
Patient says she has occassional bilateral hand cramps that started in 10/2021. The cramps occur 3-4x per month and last about 10 seconds. They always occur while she is holding something, and she says it has happened eating and mopping her floor. She says her fingers get stuck in fixed positions, and there is 4/10 associated pain. She waits for the cramping to end, and then uses voltaren gel, which provides some relief. On exam, no abnormalities or deformities of the hands noticed. She has a history of bilateral leg cramps but has not experienced any recently. In the past, discussed hwo the cramps could be secondary to losartan-HCTZ, which can cause electrolyte abnormalities. Checking BMP today, and we will discuss results with patient. ? ?Plan ?- Follow-up BMP results ?

## 2022-04-04 NOTE — Progress Notes (Signed)
? ?Subjective:  ? ?Patient ID: Mia Underwood female   DOB: Nov 17, 1951 71 y.o.   MRN: [id number] ? ?HPI: ?[name] is a 71 y.o. with PMHx of HTN, hyperlipidemia, depression who presents with for routine care and to discuss hand cramps. See problem-based charting for full assessment and plan. ? ? ?Past Medical History:  ?Diagnosis Date  ? Allergic rhinitis 05/04/2016  ? Arthritis   ? Bilateral carpal tunnel syndrome 10/18/2010  ? Left > Right   ? Colles' fracture of left radius 07/01/2019  ? Diverticulosis of sigmoid colon 06/07/2010  ? Essential hypertension 09/14/2009  ? Hyperlipidemia 09/14/2009  ? Insomnia secondary to depression with anxiety 05/04/2016  ? Major depression, recurrent, chronic ([place]) 09/14/2009  ? Marijuana abuse 04/04/2016  ? Muscle spasm of left shoulder 02/03/2019  ? Tobacco abuse 01/17/2016  ? Upper respiratory infection, viral 02/03/2019  ? Vitamin D deficiency 04/04/2016  ? ?Current Outpatient Medications  ?Medication Sig Dispense Refill  ? cetirizine (ZYRTEC) 10 MG tablet TAKE 1 TABLET(10 MG) BY MOUTH AT BEDTIME 90 tablet 3  ? gabapentin (NEURONTIN) 600 MG tablet TAKE 1 TABLET(600 MG) BY MOUTH TWICE DAILY 180 tablet 3  ? amLODipine (NORVASC) 10 MG tablet Take 1 tablet (10 mg total) by mouth daily. 90 tablet 3  ? calcium carbonate (OS-CAL) 600 MG TABS tablet Take 1 tablet (600 mg total) by mouth 2 (two) times daily with a meal. 180 tablet 3  ? Cholecalciferol (VITAMIN D) 50 MCG (2000 UT) tablet Take 1 tablet (2,000 Units total) by mouth daily. 90 tablet 3  ? diclofenac Sodium (VOLTAREN) 1 % GEL Apply 4 g topically 4 (four) times daily. 150 g 3  ? lisinopril-hydrochlorothiazide (ZESTORETIC) 20-25 MG tablet Take 2 tablets by mouth daily. 180 tablet 3  ? rosuvastatin (CRESTOR) 20 MG tablet Take 1 tablet (20 mg total) by mouth daily. 90 tablet 3  ? sertraline (ZOLOFT) 100 MG tablet 1  qam 60 tablet 5  ? traZODone (DESYREL) 50 MG tablet TAKE 2 TABLETS BY MOUTH EVERY NIGHT AT BEDTIME 60 tablet 8  ?  vitamin B-12 (CYANOCOBALAMIN) 1000 MCG tablet Take 1 tablet (1,000 mcg total) by mouth daily. 30 tablet 0  ? ?No current facility-administered medications for this visit.  ? ?Family History  ?Problem Relation Age of Onset  ? Breast cancer Mother   ? Depression Mother   ? Hypertension Mother   ? Dementia Mother   ? Alcoholism Father   ?     Died in his 03-08-2023 related to alcohol abuse  ? Leukemia Sister 8  ? Early death Brother   ?     Died in a motor vehicle accident  ? Heart attack Daughter 53  ?     Per patient report, specifics unknown  ? Diabetes Mellitus II Maternal Grandmother   ? Diabetes Mellitus II Paternal Grandmother   ? Healthy Sister   ? Healthy Sister   ? Arthritis Brother   ? Colon polyps Brother   ? Healthy Brother   ? Healthy Brother   ? Healthy Son   ? Breast cancer Maternal Aunt   ? Breast cancer Cousin   ? Breast cancer Maternal Aunt   ? Colon cancer Neg Hx   ? Esophageal cancer Neg Hx   ? Rectal cancer Neg Hx   ? Stomach cancer Neg Hx   ? ?Social History  ? ?Socioeconomic History  ? Marital status: Single  ?  Spouse name: Not on file  ? Number  of children: 2  ? Years of education: 12 grade  ? Highest education level: Not on file  ?Occupational History  ? Occupation: Ambulance person  ?  Employer: [employer]  ?  Comment: Retired  ?Tobacco Use  ? Smoking status: Some Days  ?  Packs/day: 0.20  ?  Types: Cigarettes  ?  Start date: 12/02/1973  ? Smokeless tobacco: Never  ? Tobacco comments:  ?  Stopped in 1980 restarted in 1985. 1pack every 8 days.  Stopped on 01/20/2016. Restarted again. Now 1 pack per week  ?Vaping Use  ? Vaping Use: Never used  ?Substance and Sexual Activity  ? Alcohol use: Yes  ?  Alcohol/week: 2.0 standard drinks  ?  Types: 1 Glasses of wine, 1 Cans of beer per week  ?  Comment: drinks socially with friends  ? Drug use: No  ? Sexual activity: Not Currently  ?Other Topics Concern  ? Not on file  ?Social History Narrative  ? Graduated from [employer] in [place].  Retired Education administrator.  Lives alone.  Divorced X 2.  ?   ? Current Social History 03/01/2021  ? Patient lives with granddaughter in an apartment on the first floor. There are not steps up to the entrance the patient uses.   ?   ? Patient's method of transportation is personal car.  ?   ? The highest level of education was some college.  ?   ? The patient currently retired.  ?   ? Identified important Relationships are "My brother, my son"   ?   ? Pets : None, but I would like to have one  ?    ? Interests / Fun: Love to read, play solitaire, small get-togethers with sisters   ?   ? Current Stressors: My community is getting violent, strained family relationships  ?   ? Religious / Personal Beliefs: Baptized [employer]   ?   ? ?Social Determinants of Health  ? ?Financial Resource Strain: Not on file  ?Food Insecurity: Not on file  ?Transportation Needs: Not on file  ?Physical Activity: Not on file  ?Stress: Not on file  ?Social Connections: Not on file  ? ?Review of Systems: ?Pertinent items are noted in HPI. ?Objective:  ?Physical Exam: ?Vitals:  ? 04/04/22 0835 04/04/22 1003  ?BP: (!) 150/74 (!) 149/71  ?Pulse: 78 66  ?Temp: 99.8 ?F (37.7 ?C)   ?TempSrc: Oral   ?SpO2: 98%   ?Weight: 148 lb 9.6 oz (67.4 kg)   ?Height: 5' (1.524 m)   ? ?BP (!) 149/71 (BP Location: Right Arm, Patient Position: Sitting, Cuff Size: Small)   Pulse 66   Temp 99.8 ?F (37.7 ?C) (Oral)   Ht 5' (1.524 m)   Wt 148 lb 9.6 oz (67.4 kg)   SpO2 98%   BMI 29.02 kg/m?  ? ?General Appearance:    Alert, cooperative, no distress  ?Head:    Normocephalic, atraumatic  ?Throat:   Lips, mucosa, and tongue normal; teeth and gums normal  ?Lungs:     Clear to auscultation bilaterally, respirations unlabored  ? Heart:    Regular rate and rhythm, S1 and S2 normal, no murmur, rub   or gallop  ?Abdomen:     Soft, non-tender, bowel sounds active all four quadrants  ?Extremities:   Extremities normal, atraumatic, no cyanosis or edema. No abnormalities of hands or  fingers.  ?Lymph nodes:   Cervical, supraclavicular, and axillary nodes normal  ? ?  Assessment & Plan:  ? ?See encounters tab for problem-based charting. ? ?Osteopenia ?Patient has a history of osteopenia. She has continued on Vitamin D supplementation, and we are checking her Vitamin D level today. DEXA Scan ordered. ? ?Plan ?- Follow-up Vitamin D results ?- Follow-up DEXA Scan results ? ?Tobacco abuse ?Patient said she is currently smoking 1 pack of cigarettes every 1-2 weeks, and she is doing so because she has been under a lot of stress. Reminded her that quitting smoking could help reduce her blood pressure and improve her overall health. She expressed understanding.  ? ?Vitamin D deficiency ?Patient has a history of osteopenia. She has continued on Vitamin D supplementation, and we are checking her Vitamin D level today. Last level in 05/2021 was wnl at 43.4.  DEXA Scan ordered.  ? ?Plan ?- Follow-up Vitamin D results ?- Follow-up DEXA Scan results ?- Continue Vitamin D supplementation ? ?Major depression, recurrent, chronic ([place]) ?Patient has chronic depression that appears to be worsening. PHQ 9 score today is 15, which is the same score as her prior visit on 12/27/2021. She says she does not like being around other people because they stress her. She also expressed that if she had enough money, she would escape to a place where no one could find her. She continues following with Dr. Casimiro Needle and Dr. Theodis Shove. She is concerned that sertraline is not having much of an effect on her, and she will speak with Dr. Casimiro Needle about this at her next appointment later this month. She said she has refills of sertraline and trazodone to pick up from her pharmacy today. ? ?Plan ?- Continue Zoloft '100mg'$  daily ?- Continue Trazodone '50mg'$  daily ? ?Neuropathy of left lower extremity ?Patient says she has not been experiencing any neuropathic symptoms. She says the numbness and tingling of the LLE only occurs in the winter months.  She denies any numbness and tingling in RLE and arms. She continues taking gabapentin '600mg'$ , and she says she is supposed to pick up a refill today. Patient also continues taking Vit B12 supplementation, so we will chec

## 2022-04-04 NOTE — Assessment & Plan Note (Addendum)
Patient is due for a shingles [facility]) vaccine at her pharmacy, but she does not plan to get the vaccine at this time. She said she would prefer to wait to see if she get shingles, so we discussed how painful the illness can be, and she will consider. ?

## 2022-04-04 NOTE — Assessment & Plan Note (Signed)
Patient has chronic hyperlipidemia. Lipids were normal when last checked 05/2021. She reports she takes the rosuvastatin "most days". Encouraged patient to take the medication daily, and ordered a lipid panel.  ? ?Plan ?- Follow-up lipid panel ?- Continue rosuvastatin '20mg'$  daily ?

## 2022-04-04 NOTE — Assessment & Plan Note (Addendum)
Patient said she is currently smoking 1 pack of cigarettes every 1-2 weeks, and she is doing so because she has been under a lot of stress. Reminded her that quitting smoking could help reduce her blood pressure and improve her overall health. She expressed understanding.  ?

## 2022-04-04 NOTE — Assessment & Plan Note (Addendum)
Patient has a history of osteopenia. She has continued on Vitamin D supplementation, and we are checking her Vitamin D level today. Last level in 05/2021 was wnl at 43.4.  DEXA Scan ordered.  ? ?Plan ?- Follow-up Vitamin D results ?- Follow-up DEXA Scan results ?- Continue Vitamin D supplementation ?

## 2022-04-04 NOTE — Assessment & Plan Note (Addendum)
>>  ASSESSMENT AND PLAN FOR OSTEOPENIA WRITTEN ON 04/04/2022  9:56 AM BY Danie Chandler, MEDICAL STUDENT  Patient has a history of osteopenia. She has continued on Vitamin D supplementation, and we are checking her Vitamin D level today. DEXA Scan ordered.  Plan - Follow-up Vitamin D results - Follow-up DEXA Scan results  >>ASSESSMENT AND PLAN FOR POSTMENOPAUSAL ESTROGEN DEFICIENCY WRITTEN ON 04/05/2022  4:35 PM BY Aldine Contes, MD  - Patient due for a repeat Dexa scan - Will order this today

## 2022-04-04 NOTE — Patient Instructions (Addendum)
It was very nice to see you in clinic today. Here are a few reminders from what we discussed: ? ?We will check a few labs on you today checking your kidneys, electrolytes, Vitamin B12, Vitamin D, and lipids. We will call you with the results. ?We are ordering a DEXA Scan for you to check your bone health. ?Your blood pressure is elevated today. Please take your blood pressure medications consistently. ?We recommend you stop taking the potassium supplement unless your potassium level is low when we check the labs today. ?Your PHQ-9 score is higher than normal for you. Please continue following up with Dr. Theodis Shove and Dr. Casimiro Needle as we continue working on the depression. ?We recommend you take a shingles [facility]) vaccine at your pharmacy. ?We recommend that you quit smoking. This will help improve your overall health. ? ?Please follow-up with [place] in 3 months. If you have any questions or concerns before your next appointment, please do not hesitate to give [place] a call.   ?

## 2022-04-04 NOTE — Assessment & Plan Note (Addendum)
Patient has chronic depression that appears to be worsening. PHQ 9 score today is 15, which is the same score as her prior visit on 12/27/2021. She says she does not like being around other people because they stress her. She also expressed that if she had enough money, she would escape to a place where no one could find her. She continues following with Dr. Casimiro Needle and Dr. Theodis Shove. She is concerned that sertraline is not having much of an effect on her, and she will speak with Dr. Casimiro Needle about this at her next appointment later this month. She said she has refills of sertraline and trazodone to pick up from her pharmacy today. ? ?Plan ?- Continue Zoloft '100mg'$  daily ?- Continue Trazodone '50mg'$  daily ?- Continue regular follow-up with Behavioral Health ?

## 2022-04-05 LAB — BMP8+ANION GAP
Anion Gap: 16 mmol/L (ref 10.0–18.0)
BUN/Creatinine Ratio: 17 (ref 12–28)
BUN: 12 mg/dL (ref 8–27)
CO2: 23 mmol/L (ref 20–29)
Calcium: 10 mg/dL (ref 8.7–10.3)
Chloride: 99 mmol/L (ref 96–106)
Creatinine, Ser: 0.71 mg/dL (ref 0.57–1.00)
Glucose: 106 mg/dL — ABNORMAL HIGH (ref 70–99)
Potassium: 4.1 mmol/L (ref 3.5–5.2)
Sodium: 138 mmol/L (ref 134–144)
eGFR: 92 mL/min/{1.73_m2} (ref >59)

## 2022-04-05 LAB — VITAMIN B12: Vitamin B-12: 972 pg/mL (ref 232–1245)

## 2022-04-05 LAB — LIPID PANEL
Chol/HDL Ratio: 2.9 ratio (ref 0.0–4.4)
Cholesterol, Total: 167 mg/dL (ref 100–199)
HDL: 57 mg/dL (ref >39)
LDL Chol Calc (NIH): 96 mg/dL (ref 0–99)
Triglycerides: 76 mg/dL (ref 0–149)
VLDL Cholesterol Cal: 14 mg/dL (ref 5–40)

## 2022-04-05 LAB — VITAMIN D 25 HYDROXY (VIT D DEFICIENCY, FRACTURES): Vit D, 25-Hydroxy: 17.1 ng/mL — ABNORMAL LOW (ref 30.0–100.0)

## 2022-04-05 NOTE — Progress Notes (Signed)
Attestation for Student Documentation: ? ?I personally was present and performed or re-performed the history, physical exam and medical decision-making activities of this service and have verified that the service and findings are accurately documented in the student?s note. ? ?Aldine Contes, MD ?04/05/2022, 4:36 PM ? ?

## 2022-04-05 NOTE — Assessment & Plan Note (Signed)
-   Patient due for a repeat Dexa scan ?- Will order this today ?

## 2022-04-10 ENCOUNTER — Telehealth: Payer: Self-pay | Admitting: Internal Medicine

## 2022-04-10 NOTE — Telephone Encounter (Signed)
I called the patient to discuss the results of her blood work with her.  Patient's BMP was within normal limits except for mildly elevated blood glucose of 106.  Patient's vitamin B12 was within normal limits and her cholesterol panel showed an LDL of 96 and HDL 57 with a total cholesterol of 167 and triglycerides of 76.  Patient was noted to have a low vitamin D level of 17.1.  Patient was encouraged to continue her vitamin D supplementation.  No further work-up required at this time.  Patient expressed understanding and is in agreement with plan. ?

## 2022-04-13 ENCOUNTER — Ambulatory Visit: Payer: Medicare HMO | Admitting: Behavioral Health

## 2022-04-13 DIAGNOSIS — F419 Anxiety disorder, unspecified: Secondary | ICD-10-CM

## 2022-04-13 DIAGNOSIS — F331 Major depressive disorder, recurrent, moderate: Secondary | ICD-10-CM

## 2022-04-13 NOTE — BH Specialist Note (Signed)
Integrated Behavioral Health via Telemedicine Visit  04/13/2022 Mia Underwood [id number]  Number of [place] Clinician visits: 4 Session Start time: 1100 Session End time: 1130 Total time in minutes: 30 min  Referring Provider: Dr. Orie Fisherman, MD Patient/Family location: Pt is home in private [facility] Provider location: [facility] Office All persons participating in visit: Pt & Clinician Types of Service: Individual psychotherapy  I connected with Mia Underwood and/or Mia Underwood's  self  via  Telephone or Video Enabled Telemedicine Application  (Video is Caregility application) and verified that I am speaking with the correct person using two identifiers. Discussed confidentiality: Yes   I discussed the limitations of telemedicine and the availability of in person appointments.  Discussed there is a possibility of technology failure and discussed alternative modes of communication if that failure occurs.  I discussed that engaging in this telemedicine visit, they consent to the provision of behavioral healthcare and the services will be billed under their insurance.  Patient and/or legal guardian expressed understanding and consented to Telemedicine visit: Yes   Presenting Concerns: Patient and/or family reports the following symptoms/concerns: elevated anx/dep due to situational stressors in the Family that promote her knowing everyone's problems. This can become overwhelming.  Duration of problem: years; Severity of problem: moderate  Patient and/or Family's Strengths/Protective Factors: Social connections, Social and Emotional competence, Concrete supports in place (healthy food, safe environments, etc.), and Sense of purpose  Goals Addressed: Patient will:  Reduce symptoms of: anxiety, depression, and stress   Increase knowledge and/or ability of: coping skills and stress reduction   Demonstrate ability to: Increase healthy adjustment to current life  circumstances  Progress towards Goals: Ongoing  Interventions: Interventions utilized:  Supportive Counseling Standardized Assessments completed:  screeners prn  Patient and/or Family Response: Pt is receptive to call today  Assessment: Patient currently experiencing anx due to Family stressors. Pt is trying to manage the unequal treatment of support resources over the years.  Patient may benefit from cont'd Cslg to process her own issues as she manages the needs of others.  Plan: Follow up with behavioral health clinician on : Next avail 30 min ck-in on telehealth Behavioral recommendations: Cont to strengthen your boundaries w/Family. Referral(s): [place] (In Clinic)  I discussed the assessment and treatment plan with the patient and/or parent/guardian. They were provided an opportunity to ask questions and all were answered. They agreed with the plan and demonstrated an understanding of the instructions.   They were advised to call back or seek an in-person evaluation if the symptoms worsen or if the condition fails to improve as anticipated.  Donnetta Hutching, LMFT

## 2022-05-10 ENCOUNTER — Ambulatory Visit: Payer: Medicare HMO | Admitting: Behavioral Health

## 2022-05-10 DIAGNOSIS — F419 Anxiety disorder, unspecified: Secondary | ICD-10-CM

## 2022-05-10 DIAGNOSIS — F331 Major depressive disorder, recurrent, moderate: Secondary | ICD-10-CM

## 2022-05-10 NOTE — BH Specialist Note (Signed)
Integrated Behavioral Health via Telemedicine Visit  05/10/2022 Mia Underwood [id number]  Number of [place] Clinician visits: 5 Session Start time: 1110 Session End time: 1140 Total time in minutes: 30 min  Referring Provider: Dr. Orie Fisherman, MD Patient/Family location: Pt is home in private [facility] Provider location: [facility] Office  All persons participating in visit: Pt & Clinician Types of Service: Individual psychotherapy  I connected with [name] and/or Chauncey Reading Deese's  self  via  Telephone or Video Enabled Telemedicine Application  (Video is Caregility application) and verified that I am speaking with the correct person using two identifiers. Discussed confidentiality: Yes   I discussed the limitations of telemedicine and the availability of in person appointments.  Discussed there is a possibility of technology failure and discussed alternative modes of communication if that failure occurs.  I discussed that engaging in this telemedicine visit, they consent to the provision of behavioral healthcare and the services will be billed under their insurance.  Patient and/or legal guardian expressed understanding and consented to Telemedicine visit: Yes   Presenting Concerns: Patient and/or family reports the following symptoms/concerns: Pt reports her Rockie Neighbours has given her grief over Pt refusal to provide childcare for her 2 young children; ages 20yo & 70yo. Pt feels she is tired & too old to handle 2 young children. Rockie Neighbours has been very upset by this, but Pt is holding her ground.  Duration of problem: months; Severity of problem: moderate  Patient and/or Family's Strengths/Protective Factors: Social and Emotional competence, Concrete supports in place (healthy food, safe environments, etc.), and Sense of purpose  Goals Addressed: Patient will:  Reduce symptoms of: anxiety, depression, and stress   Increase knowledge and/or ability of: coping skills and stress  reduction   Demonstrate ability to: Increase healthy adjustment to current life circumstances  Progress towards Goals: Ongoing & transitioned today to New Provider in July  Interventions: Interventions utilized:  Supportive Counseling Standardized Assessments completed:  screeners prn  Patient and/or Family Response: Pt is receptive to call today.  Assessment: Patient currently experiencing elevated anx/dep due to the treatment/response of Grand Dtr to Pt keeping her boundaries..   Patient may benefit from cont'd ck-ins with future Cslr.  Plan: Follow up with behavioral health clinician on : TBD by New Cslr Behavioral recommendations: Keep your healthy boundaries w/Family Referral(s): [place] (In Clinic)  I discussed the assessment and treatment plan with the patient and/or parent/guardian. They were provided an opportunity to ask questions and all were answered. They agreed with the plan and demonstrated an understanding of the instructions.   They were advised to call back or seek an in-person evaluation if the symptoms worsen or if the condition fails to improve as anticipated.  Donnetta Hutching, LMFT

## 2022-06-04 NOTE — Addendum Note (Signed)
Encounter addended by: Annie Paras on: 06/04/2022 3:08 PM  Actions taken: Letter saved

## 2022-06-07 ENCOUNTER — Other Ambulatory Visit: Payer: Self-pay | Admitting: Internal Medicine

## 2022-06-07 DIAGNOSIS — Z1231 Encounter for screening mammogram for malignant neoplasm of breast: Secondary | ICD-10-CM

## 2022-06-12 ENCOUNTER — Ambulatory Visit
Admission: RE | Admit: 2022-06-12 | Discharge: 2022-06-12 | Disposition: A | Discharge status: Home / Self Care | Payer: Medicare HMO | Source: Ambulatory Visit | Attending: Internal Medicine | Admitting: Internal Medicine

## 2022-06-12 DIAGNOSIS — Z1231 Encounter for screening mammogram for malignant neoplasm of breast: Secondary | ICD-10-CM

## 2022-06-14 ENCOUNTER — Telehealth: Payer: Self-pay | Admitting: *Deleted

## 2022-06-14 DIAGNOSIS — F339 Major depressive disorder, recurrent, unspecified: Secondary | ICD-10-CM

## 2022-06-14 NOTE — Telephone Encounter (Signed)
RTC to patient has been to [facility] before.  Would like to go back there if possible.

## 2022-06-14 NOTE — Telephone Encounter (Signed)
Call from patient to ask what they are to do about counseling now that Dr. Theodis Shove has left.  Patient was given number emergency number to [facility]. Patient stated is doing ok just wanted to know what to do if needed. Message to be sent to PCP for referral for counseling.

## 2022-06-20 ENCOUNTER — Ambulatory Visit (HOSPITAL_BASED_OUTPATIENT_CLINIC_OR_DEPARTMENT_OTHER): Payer: Medicare HMO | Admitting: Psychiatry

## 2022-06-20 DIAGNOSIS — R69 Illness, unspecified: Secondary | ICD-10-CM | POA: Diagnosis not present

## 2022-06-20 DIAGNOSIS — F3341 Major depressive disorder, recurrent, in partial remission: Secondary | ICD-10-CM

## 2022-06-20 MED ORDER — SERTRALINE HCL 100 MG PO TABS: ORAL_TABLET | ORAL | 5 refills | Status: DC

## 2022-06-20 MED ORDER — TRAZODONE HCL 50 MG PO TABS: ORAL_TABLET | ORAL | 8 refills | Status: DC

## 2022-06-20 NOTE — Progress Notes (Signed)
Psychiatric Initial Adult Assessment   Patient Identification: Mia Underwood MRN:  [id number] Date of Evaluation:  06/20/2022 Referral Source: [facility] Chief Complaint: Clinical dg along well as best she can. Virtual Visit via Telephone Note    Today the patient is doing only fairly well.  She had significant conflicts with her neighbor.  Her next-door neighbor and her having words and he does seem to have resolved things.  Patient also issues with her granddaughter who wants her to babysit for the granddaughter's 2 sons.  Therefore these are the patient's great grandsons.  Obviously young children.  The patient feels that she is taking advantage of her.  She feels that she is too soft and people around her take advantage of her.  She is also had a number of deaths and illnesses in the family.  The patient was under the care of [facility] who unfortunately has not been available in the last few weeks.  This is an important time for the patient to have that talk to somebody.  The patient says she just does not feel herself.  She says she has a hard time caring for herself and just to go back for the first time in a week today.  She now she is sleeping and eating fairly well.  She is resistant and has been to taking trazodone on a regular basis which I suggest.  She takes her Zoloft 100 mg as prescribed.  She is not suicidal and she continues to function fairly well.  She acknowledges that the things she needs right now is a therapist.  I will make efforts to help her with that.  The patient will return to see me in 3 months. I connected with DANNYA PITKIN on 06/20/22 at  2:00 PM EDT by telephone and verified that I am speaking with the correct person using two identifiers.  Location: Patient: home Provider: office   I discussed the limitations, risks, security and privacy concerns of performing an evaluation and management service by telephone and the  availability of in person appointments. I also discussed with the patient that there may be a patient responsible charge related to this service. The patient expressed understanding and agreed to proceed.  ructions:    I discussed the assessment and treatment plan with the patient. The patient was provided an opportunity to ask questions and all were answered. The patient agreed with the plan and demonstrated an understanding of the instructions.   The patient was advised to call back or seek an in-person evaluation if the symptoms worsen or if the condition fails to improve as anticipated.  I provided 15 minutes of non-face-to-face time during this encounter.   Jerral Ralph, MD  Past Psychiatric History:   Previous Psychotropic Medications: Yes   Substance Abuse History in the last 12 months:  Yes.    Consequences of Substance Abuse:   Past Medical History:  Past Medical History:  Diagnosis Date   Allergic rhinitis 05/04/2016   Arthritis    Bilateral carpal tunnel syndrome 10/18/2010   Left > Right    Colles' fracture of left radius 07/01/2019   Diverticulosis of sigmoid colon 06/07/2010   Essential hypertension 09/14/2009   Hyperlipidemia 09/14/2009   Insomnia secondary to depression with anxiety 05/04/2016   Major depression, recurrent, chronic ([place]) 09/14/2009   Marijuana abuse 04/04/2016   Muscle spasm of left shoulder 02/03/2019   Tobacco abuse 01/17/2016   Upper respiratory  infection, viral 02/03/2019   Vitamin D deficiency 04/04/2016    Past Surgical History:  Procedure Laterality Date   ABDOMINAL HYSTERECTOMY  April 1996   For fibroids   COLONOSCOPY  07/18/2010   Patterson-tic's    Family Psychiatric History:   Family History:  Family History  Problem Relation Age of Onset   Breast cancer Mother    Depression Mother    Hypertension Mother    Dementia Mother    Alcoholism Father        Died in his 83's related to alcohol abuse   Leukemia Sister 61   Early death  Brother        Died in a motor vehicle accident   Heart attack Daughter 49       Per patient report, specifics unknown   Diabetes Mellitus II Maternal Grandmother    Diabetes Mellitus II Paternal Grandmother    Healthy Sister    Healthy Sister    Arthritis Brother    Colon polyps Brother    Healthy Brother    Healthy Brother    Healthy Son    Breast cancer Maternal Aunt    Breast cancer Cousin    Breast cancer Maternal Aunt    Colon cancer Neg Hx    Esophageal cancer Neg Hx    Rectal cancer Neg Hx    Stomach cancer Neg Hx     Social History:   Social History   Socioeconomic History   Marital status: Single    Spouse name: Not on file   Number of children: 2   Years of education: 12 grade   Highest education level: Not on file  Occupational History   Occupation: Leisure centre manager: [employer]    Comment: Retired  Tobacco Use   Smoking status: Some Days    Packs/day: 0.20    Types: Cigarettes    Start date: 12/02/1973   Smokeless tobacco: Never   Tobacco comments:    Stopped in 1980 restarted in 1985. 1pack every 8 days.  Stopped on 01/20/2016. Restarted again. Now 1 pack per week  Vaping Use   Vaping Use: Never used  Substance and Sexual Activity   Alcohol use: Yes    Alcohol/week: 2.0 standard drinks of alcohol    Types: 1 Glasses of wine, 1 Cans of beer per week    Comment: drinks socially with friends   Drug use: No   Sexual activity: Not Currently  Other Topics Concern   Not on file  Social History Narrative   Graduated from [employer] in [place].  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      Current Social History 03/01/2021   Patient lives with granddaughter in an apartment on the first floor. There are not steps up to the entrance the patient uses.       Patient's method of transportation is personal car.      The highest level of education was some college.      The patient currently retired.      Identified important  Relationships are "My brother, my son"       Pets : None, but I would like to have one       Interests / Fun: Love to read, play solitaire, Therapist, nutritional with sisters       Current Stressors: My community is getting violent, strained family relationships      Religious / Personal Beliefs: Baptized [employer]  Day [employer]ist       Social Determinants of Health   Financial Resource Strain: Not on file  Food Insecurity: Not on file  Transportation Needs: Not on file  Physical Activity: Not on file  Stress: Not on file  Social Connections: Not on file    Additional Social History:   Allergies:   Allergies  Allergen Reactions   Penicillins Hives   Naproxen Rash    Pt states she takes Aleve without problems    Metabolic Disorder Labs: No results found for: "HGBA1C", "MPG" No results found for: "PROLACTIN" Lab Results  Component Value Date   CHOL 167 04/04/2022   TRIG 76 04/04/2022   HDL 57 04/04/2022   CHOLHDL 2.9 04/04/2022   VLDL 20 11/01/2010   LDLCALC 96 04/04/2022   LDLCALC 70 06/20/2021     Current Medications: Current Outpatient Medications  Medication Sig Dispense Refill   cetirizine (ZYRTEC) 10 MG tablet TAKE 1 TABLET(10 MG) BY MOUTH AT BEDTIME 90 tablet 3   gabapentin (NEURONTIN) 600 MG tablet TAKE 1 TABLET(600 MG) BY MOUTH TWICE DAILY 180 tablet 3   amLODipine (NORVASC) 10 MG tablet Take 1 tablet (10 mg total) by mouth daily. 90 tablet 3   calcium carbonate (OS-CAL) 600 MG TABS tablet Take 1 tablet (600 mg total) by mouth 2 (two) times daily with a meal. 180 tablet 3   Cholecalciferol (VITAMIN D) 50 MCG (2000 UT) tablet Take 1 tablet (2,000 Units total) by mouth daily. 90 tablet 3   diclofenac Sodium (VOLTAREN) 1 % GEL Apply 4 g topically 4 (four) times daily. 150 g 3   lisinopril-hydrochlorothiazide (ZESTORETIC) 20-25 MG tablet Take 2 tablets by mouth daily. 180 tablet 3   rosuvastatin (CRESTOR) 20 MG tablet Take 1 tablet (20 mg total) by mouth daily. 90  tablet 3   sertraline (ZOLOFT) 100 MG tablet 1  qam 60 tablet 5   traZODone (DESYREL) 50 MG tablet TAKE 2 TABLETS BY MOUTH EVERY NIGHT AT BEDTIME 60 tablet 8   vitamin B-12 (CYANOCOBALAMIN) 1000 MCG tablet Take 1 tablet (1,000 mcg total) by mouth daily. 30 tablet 0   No current facility-administered medications for this visit.    Neurologic: Headache: No Seizure: No Paresthesias:No  Musculoskeletal: Strength & Muscle Tone: within normal limits Gait & Station: normal Patient leans: N/A  Psychiatric Specialty Exam: ROS  There were no vitals taken for this visit.There is no height or weight on file to calculate BMI.  General Appearance: Casual  Eye Contact:  Fair  Speech:  Clear and Coherent  Volume:  Normal  Mood:  Depressed  Affect:  Blunt  Thought Process:  Goal Directed  Orientation:  Full (Time, Place, and Person)  Thought Content:  WDL  Suicidal Thoughts:  No  Homicidal Thoughts:  No  Memory:  NA  Judgement:  Good  Insight:  Good  Psychomotor Activity:  Normal  Concentration:    Recall:  [place] of Knowledge:Good  Language: Poor  Akathisia:  No  Handed:  Right  AIMS (if indicated):    Assets:  Desire for Improvement  ADL's:  Intact  Cognition: WNL  Sleep:      Treatment Plan Summary: 7/25/20232:34 PM     Today was a visit at the office.  Patient is doing fair.  She is distresses by a lot of things around her.  She feels irritable about things.  For now we will continue her Zoloft as prescribed and I recommend that she take trazodone  every night.  She says when she takes it she gets a better night of sleep.  Most importantly the patient needs to be in therapy.  I will make an effort to contact this setting where she was getting therapy.  She come back to see me in 3 months.  Patient in general is functioning okay.

## 2022-07-18 ENCOUNTER — Encounter: Payer: Medicare HMO | Admitting: Internal Medicine

## 2022-07-20 ENCOUNTER — Encounter: Payer: Medicare HMO | Admitting: Internal Medicine

## 2022-07-26 DIAGNOSIS — J439 Emphysema, unspecified: Secondary | ICD-10-CM | POA: Diagnosis not present

## 2022-07-26 DIAGNOSIS — G4700 Insomnia, unspecified: Secondary | ICD-10-CM | POA: Diagnosis not present

## 2022-07-26 DIAGNOSIS — Z823 Family history of stroke: Secondary | ICD-10-CM | POA: Diagnosis not present

## 2022-07-26 DIAGNOSIS — R69 Illness, unspecified: Secondary | ICD-10-CM | POA: Diagnosis not present

## 2022-07-26 DIAGNOSIS — I2510 Atherosclerotic heart disease of native coronary artery without angina pectoris: Secondary | ICD-10-CM | POA: Diagnosis not present

## 2022-07-26 DIAGNOSIS — Z8249 Family history of ischemic heart disease and other diseases of the circulatory system: Secondary | ICD-10-CM | POA: Diagnosis not present

## 2022-07-26 DIAGNOSIS — G629 Polyneuropathy, unspecified: Secondary | ICD-10-CM | POA: Diagnosis not present

## 2022-07-26 DIAGNOSIS — E785 Hyperlipidemia, unspecified: Secondary | ICD-10-CM | POA: Diagnosis not present

## 2022-07-26 DIAGNOSIS — Z008 Encounter for other general examination: Secondary | ICD-10-CM | POA: Diagnosis not present

## 2022-07-26 DIAGNOSIS — I10 Essential (primary) hypertension: Secondary | ICD-10-CM | POA: Diagnosis not present

## 2022-07-26 DIAGNOSIS — I700 Atherosclerosis of aorta: Secondary | ICD-10-CM | POA: Diagnosis not present

## 2022-08-04 ENCOUNTER — Ambulatory Visit
Admission: EM | Admit: 2022-08-04 | Discharge: 2022-08-04 | Disposition: A | Discharge status: Home / Self Care | Payer: Medicare HMO | Attending: Emergency Medicine | Admitting: Emergency Medicine

## 2022-08-04 DIAGNOSIS — R410 Disorientation, unspecified: Secondary | ICD-10-CM | POA: Diagnosis not present

## 2022-08-04 DIAGNOSIS — Z1152 Encounter for screening for COVID-19: Secondary | ICD-10-CM | POA: Insufficient documentation

## 2022-08-04 LAB — POCT URINALYSIS DIP (MANUAL ENTRY)
Bilirubin, UA: NEGATIVE
Blood, UA: NEGATIVE
Glucose, UA: NEGATIVE mg/dL
Ketones, POC UA: NEGATIVE mg/dL
Leukocytes, UA: NEGATIVE
Nitrite, UA: NEGATIVE
Protein Ur, POC: NEGATIVE mg/dL
Spec Grav, UA: 1.02 (ref 1.010–1.025)
Urobilinogen, UA: 0.2 E.U./dL
pH, UA: 5.5 (ref 5.0–8.0)

## 2022-08-04 LAB — SARS CORONAVIRUS 2 BY RT PCR: SARS Coronavirus 2 by RT PCR: NEGATIVE

## 2022-08-04 NOTE — ED Provider Notes (Signed)
[facility]    CSN: [id number] Arrival date & time: 08/04/22  1515    HISTORY   Chief Complaint  Patient presents with   Hypotension   HPI Mia Underwood is a pleasant, 71 y.o. female who presents to urgent care today. Patient is here today with caregiver who states that she checked patient's blood pressure last night and found that her systolic was in the 34V and diastolic was in the 42V.  Caregiver states that patient was also complaining of feeling dizzy and that her legs were wobbly.  Patient denies any changes in her medications.  Patient denies irritative urinary outflow tract symptoms.  Urinalysis today in the clinic is unremarkable.  The history is provided by the patient.   Past Medical History:  Diagnosis Date   Allergic rhinitis 05/04/2016   Arthritis    Bilateral carpal tunnel syndrome 10/18/2010   Left > Right    Colles' fracture of left radius 07/01/2019   Diverticulosis of sigmoid colon 06/07/2010   Essential hypertension 09/14/2009   Hyperlipidemia 09/14/2009   Insomnia secondary to depression with anxiety 05/04/2016   Major depression, recurrent, chronic ([place]) 09/14/2009   Marijuana abuse 04/04/2016   Muscle spasm of left shoulder 02/03/2019   Tobacco abuse 01/17/2016   Upper respiratory infection, viral 02/03/2019   Vitamin D deficiency 04/04/2016   Patient Active Problem List   Diagnosis Date Noted   Postmenopausal estrogen deficiency 04/04/2022   Hand cramps 04/04/2022   Osteopenia 12/16/2019   Neuropathy of left lower extremity 09/03/2019   Bilateral leg cramps 09/09/2018   Allergic rhinitis 05/04/2016   Insomnia secondary to depression with anxiety 05/04/2016   Marijuana abuse 04/04/2016   Vitamin D deficiency 04/04/2016   Tobacco abuse 01/17/2016   Healthcare maintenance 01/03/2016   Bilateral carpal tunnel syndrome 10/18/2010   Diverticulosis of sigmoid colon 06/07/2010   Hyperlipidemia 09/14/2009   Major depression, recurrent, chronic ([place])  09/14/2009   Essential hypertension 09/14/2009   Past Surgical History:  Procedure Laterality Date   ABDOMINAL HYSTERECTOMY  April 1996   For fibroids   COLONOSCOPY  07/18/2010   Patterson-tic's   OB History   No obstetric history on file.    Home Medications    Prior to Admission medications   Medication Sig Start Date End Date Taking? Authorizing Provider  cetirizine (ZYRTEC) 10 MG tablet TAKE 1 TABLET(10 MG) BY MOUTH AT BEDTIME 12/02/21   Axel Filler, MD  gabapentin (NEURONTIN) 600 MG tablet TAKE 1 TABLET(600 MG) BY MOUTH TWICE DAILY 12/02/21   Axel Filler, MD  amLODipine (NORVASC) 10 MG tablet Take 1 tablet (10 mg total) by mouth daily. 12/27/21   Aldine Contes, MD  calcium carbonate (OS-CAL) 600 MG TABS tablet Take 1 tablet (600 mg total) by mouth 2 (two) times daily with a meal. 02/19/19   Oval Linsey, MD  Cholecalciferol (VITAMIN D) 50 MCG (2000 UT) tablet Take 1 tablet (2,000 Units total) by mouth daily. 03/28/21   Aldine Contes, MD  diclofenac Sodium (VOLTAREN) 1 % GEL Apply 4 g topically 4 (four) times daily. 03/22/21   Aldine Contes, MD  lisinopril-hydrochlorothiazide (ZESTORETIC) 20-25 MG tablet Take 2 tablets by mouth daily. 12/27/21   Aldine Contes, MD  rosuvastatin (CRESTOR) 20 MG tablet Take 1 tablet (20 mg total) by mouth daily. 12/27/21   Aldine Contes, MD  sertraline (ZOLOFT) 100 MG tablet 1  qam 06/20/22   Plovsky, Berneta Sages, MD  traZODone (DESYREL) 50 MG tablet TAKE 2 TABLETS BY  MOUTH EVERY NIGHT AT BEDTIME 06/20/22   Plovsky, Berneta Sages, MD  vitamin B-12 (CYANOCOBALAMIN) 1000 MCG tablet Take 1 tablet (1,000 mcg total) by mouth daily. 03/28/21   Aldine Contes, MD    Family History Family History  Problem Relation Age of Onset   Breast cancer Mother    Depression Mother    Hypertension Mother    Dementia Mother    Alcoholism Father        Died in his 66's related to alcohol abuse   Leukemia Sister 21   Early death Brother         Died in a motor vehicle accident   Heart attack Daughter 54       Per patient report, specifics unknown   Diabetes Mellitus II Maternal Grandmother    Diabetes Mellitus II Paternal Grandmother    Healthy Sister    Healthy Sister    Arthritis Brother    Colon polyps Brother    Healthy Brother    Healthy Brother    Healthy Son    Breast cancer Maternal Aunt    Breast cancer Cousin    Breast cancer Maternal Aunt    Colon cancer Neg Hx    Esophageal cancer Neg Hx    Rectal cancer Neg Hx    Stomach cancer Neg Hx    Social History Social History   Tobacco Use   Smoking status: Some Days    Packs/day: 0.20    Types: Cigarettes    Start date: 12/02/1973   Smokeless tobacco: Never   Tobacco comments:    Stopped in 1980 restarted in 1985. 1pack every 8 days.  Stopped on 01/20/2016. Restarted again. Now 1 pack per week  Vaping Use   Vaping Use: Never used  Substance Use Topics   Alcohol use: Yes    Alcohol/week: 2.0 standard drinks of alcohol    Types: 1 Glasses of wine, 1 Cans of beer per week    Comment: drinks socially with friends   Drug use: No   Allergies   Penicillins and Naproxen  Review of Systems Review of Systems Pertinent findings revealed after performing a 14 point review of systems has been noted in the history of present illness.  Physical Exam Triage Vital Signs ED Triage Vitals  Enc Vitals Group     BP 09/23/21 0827 (!) 147/82     Pulse Rate 09/23/21 0827 72     Resp 09/23/21 0827 18     Temp 09/23/21 0827 98.3 F (36.8 C)     Temp Source 09/23/21 0827 Oral     SpO2 09/23/21 0827 98 %     Weight --      Height --      Head Circumference --      Peak Flow --      Pain Score 09/23/21 0826 5     Pain Loc --      Pain Edu? --      Excl. in [place]? --   No data found.  Updated Vital Signs BP 123/73 (BP Location: Left Arm)   Pulse 80   Temp 99 F (37.2 C) (Oral)   Resp 16   SpO2 93%   Physical Exam Vitals and nursing note reviewed.   Constitutional:      General: She is not in acute distress.    Appearance: Normal appearance. She is not ill-appearing.  HENT:     Head: Normocephalic and atraumatic.     Salivary Glands: Right salivary gland  is not diffusely enlarged or tender. Left salivary gland is not diffusely enlarged or tender.     Right Ear: Tympanic membrane, ear canal and external ear normal. No drainage. No middle ear effusion. There is no impacted cerumen. Tympanic membrane is not erythematous or bulging.     Left Ear: Tympanic membrane, ear canal and external ear normal. No drainage.  No middle ear effusion. There is no impacted cerumen. Tympanic membrane is not erythematous or bulging.     Nose: Nose normal. No nasal deformity, septal deviation, mucosal edema, congestion or rhinorrhea.     Right Turbinates: Not enlarged, swollen or pale.     Left Turbinates: Not enlarged, swollen or pale.     Right Sinus: No maxillary sinus tenderness or frontal sinus tenderness.     Left Sinus: No maxillary sinus tenderness or frontal sinus tenderness.     Mouth/Throat:     Lips: Pink. No lesions.     Mouth: Mucous membranes are moist. No oral lesions.     Pharynx: Oropharynx is clear. Uvula midline. No posterior oropharyngeal erythema or uvula swelling.     Tonsils: No tonsillar exudate. 0 on the right. 0 on the left.  Eyes:     General: Lids are normal.        Right eye: No discharge.        Left eye: No discharge.     Extraocular Movements: Extraocular movements intact.     Conjunctiva/sclera: Conjunctivae normal.     Right eye: Right conjunctiva is not injected.     Left eye: Left conjunctiva is not injected.  Neck:     Trachea: Trachea and phonation normal.  Cardiovascular:     Rate and Rhythm: Normal rate and regular rhythm.     Pulses: Normal pulses.     Heart sounds: Normal heart sounds. No murmur heard.    No friction rub. No gallop.  Pulmonary:     Effort: Pulmonary effort is normal. No accessory muscle  usage, prolonged expiration or respiratory distress.     Breath sounds: Normal breath sounds. No stridor, decreased air movement or transmitted upper airway sounds. No decreased breath sounds, wheezing, rhonchi or rales.  Chest:     Chest wall: No tenderness.  Musculoskeletal:        General: Normal range of motion.     Cervical back: Normal range of motion and neck supple. Normal range of motion.  Lymphadenopathy:     Cervical: No cervical adenopathy.  Skin:    General: Skin is warm and dry.     Findings: No erythema or rash.  Neurological:     General: No focal deficit present.     Mental Status: She is alert and oriented to person, place, and time.  Psychiatric:        Mood and Affect: Mood normal.        Behavior: Behavior normal.     Visual Acuity Right Eye Distance:   Left Eye Distance:   Bilateral Distance:    Right Eye Near:   Left Eye Near:    Bilateral Near:     [facility] / Diagnostics / Procedures:     Radiology No results found.  Procedures Procedures (including critical care time) EKG  Pending results:  Labs Reviewed  SARS CORONAVIRUS 2 BY RT PCR  POCT URINALYSIS DIP (MANUAL ENTRY)    Medications Ordered in UC: Medications - No data to display  UC Diagnoses / Final Clinical Impressions(s)   I have  reviewed the triage vital signs and the nursing notes.  Pertinent labs & imaging results that were available during my care of the patient were reviewed by me and considered in my medical decision making (see chart for details).    Final diagnoses:  Encounter for screening for COVID-19  Disorientation   Physical exam today is unremarkable with the exception of an O2 sat of 93% and urinalysis did not reveal any abnormal findings.  COVID-19 test is pending at this time, will notify patient of results once received.  Patient would benefit from Paxlovid if positive, last GFR was greater than 59 in May 2023.  ED Prescriptions   None    PDMP not reviewed  this encounter.  Disposition Upon Discharge:  Condition: stable for discharge home Home: take medications as prescribed; routine discharge instructions as discussed; follow up as advised.  Patient presented with an acute illness with associated systemic symptoms and significant discomfort requiring urgent management. In my opinion, this is a condition that a prudent lay person (someone who possesses an average knowledge of health and medicine) may potentially expect to result in complications if not addressed urgently such as respiratory distress, impairment of bodily function or dysfunction of bodily organs.   Routine symptom specific, illness specific and/or disease specific instructions were discussed with the patient and/or caregiver at length.   As such, the patient has been evaluated and assessed, work-up was performed and treatment was provided in alignment with urgent care protocols and evidence based medicine.  Patient/parent/caregiver has been advised that the patient may require follow up for further testing and treatment if the symptoms continue in spite of treatment, as clinically indicated and appropriate.  If the patient was tested for COVID-19, Influenza and/or RSV, then the patient/parent/guardian was advised to isolate at home pending the results of his/her diagnostic coronavirus test and potentially longer if they're positive. I have also advised pt that if his/her COVID-19 test returns positive, it's recommended to self-isolate for at least 10 days after symptoms first appeared AND until fever-free for 24 hours without fever reducer AND other symptoms have improved or resolved. Discussed self-isolation recommendations as well as instructions for household member/close contacts as per the [facility] and [place] DHHS, and also gave patient the [facility] packet with this information.  Patient/parent/caregiver has been advised to return to the [facility] or PCP in 3-5 days if no better; to PCP or the  Emergency Department if new signs and symptoms develop, or if the current signs or symptoms continue to change or worsen for further workup, evaluation and treatment as clinically indicated and appropriate  The patient will follow up with their current PCP if and as advised. If the patient does not currently have a PCP we will assist them in obtaining one.   The patient may need specialty follow up if the symptoms continue, in spite of conservative treatment and management, for further workup, evaluation, consultation and treatment as clinically indicated and appropriate.  Patient/parent/caregiver verbalized understanding and agreement of plan as discussed.  All questions were addressed during visit.  Please see discharge instructions below for further details of plan.  Discharge Instructions:   Discharge Instructions      Your urinalysis today is unremarkable and not concerning for urinary tract infection.  The results of your PCR COVID-19 test will be available to your MyChart later this evening.  If there is a positive result, you will be contacted as soon as possible, most likely tomorrow morning, with those results and further  recommendations.  In the meantime, I provided you with information regarding quarantine and isolation that I hope you find helpful.  Please reach out to your primary care provider to discuss your blood pressure medications if you continue to have an abnormally low blood pressure.  Thank you for visiting urgent care today.    This office note has been dictated using Museum/gallery curator.  Unfortunately, this method of dictation can sometimes lead to typographical or grammatical errors.  I apologize for your inconvenience in advance if this occurs.  Please do not hesitate to reach out to me if clarification is needed.      Lynden Oxford Scales, [place] 08/04/22 [phone]

## 2022-08-04 NOTE — ED Triage Notes (Signed)
Patient presents to UC for low BP. Pt states feeling sluggish yesterday, "felt dizzy, legs wobbly." Pt states family friend checked her BP and it was in the 90's, baseline is in the 140's. No changes in medications.

## 2022-08-04 NOTE — Discharge Instructions (Addendum)
Your urinalysis today is unremarkable and not concerning for urinary tract infection.  The results of your PCR COVID-19 test will be available to your MyChart later this evening.  If there is a positive result, you will be contacted as soon as possible, most likely tomorrow morning, with those results and further recommendations.  In the meantime, I provided you with information regarding quarantine and isolation that I hope you find helpful.  Please reach out to your primary care provider to discuss your blood pressure medications if you continue to have an abnormally low blood pressure.  Thank you for visiting urgent care today.

## 2022-08-08 ENCOUNTER — Ambulatory Visit (INDEPENDENT_AMBULATORY_CARE_PROVIDER_SITE_OTHER): Payer: Medicare HMO | Admitting: Internal Medicine

## 2022-08-08 VITALS — BP 113/65 | HR 90 | Temp 99.6°F | Ht 60.0 in | Wt 146.2 lb

## 2022-08-08 DIAGNOSIS — Z0000 Encounter for general adult medical examination without abnormal findings: Secondary | ICD-10-CM

## 2022-08-08 DIAGNOSIS — G5792 Unspecified mononeuropathy of left lower limb: Secondary | ICD-10-CM

## 2022-08-08 DIAGNOSIS — R69 Illness, unspecified: Secondary | ICD-10-CM | POA: Diagnosis not present

## 2022-08-08 DIAGNOSIS — F17210 Nicotine dependence, cigarettes, uncomplicated: Secondary | ICD-10-CM

## 2022-08-08 DIAGNOSIS — F5105 Insomnia due to other mental disorder: Secondary | ICD-10-CM

## 2022-08-08 DIAGNOSIS — E7800 Pure hypercholesterolemia, unspecified: Secondary | ICD-10-CM

## 2022-08-08 DIAGNOSIS — Z23 Encounter for immunization: Secondary | ICD-10-CM | POA: Diagnosis not present

## 2022-08-08 DIAGNOSIS — F339 Major depressive disorder, recurrent, unspecified: Secondary | ICD-10-CM

## 2022-08-08 DIAGNOSIS — I10 Essential (primary) hypertension: Secondary | ICD-10-CM

## 2022-08-08 DIAGNOSIS — F418 Other specified anxiety disorders: Secondary | ICD-10-CM

## 2022-08-08 DIAGNOSIS — E559 Vitamin D deficiency, unspecified: Secondary | ICD-10-CM

## 2022-08-08 DIAGNOSIS — Z720 Tobacco use: Secondary | ICD-10-CM

## 2022-08-08 MED ORDER — HYDROCHLOROTHIAZIDE 25 MG PO TABS: 25.0000 mg | ORAL_TABLET | Freq: Every day | ORAL | 0 refills | Status: DC

## 2022-08-08 MED ORDER — LISINOPRIL 20 MG PO TABS: 20.0000 mg | ORAL_TABLET | Freq: Every day | ORAL | 0 refills | Status: DC

## 2022-08-08 NOTE — Assessment & Plan Note (Signed)
-   This problem is chronic and stable -Patient last vitamin D level remained low -We will continue vitamin D supplements for now -We will recheck her vitamin D at her next visit -No further work-up at this time

## 2022-08-08 NOTE — Assessment & Plan Note (Signed)
-   This problem is chronic and stable -Patient denies any neuropathic symptoms today and states that numbness of her left lower extremity is well controlled on gabapentin -We will continue with gabapentin 600 mg daily -Patient is also on vitamin B12 supplements and her last vitamin B12 level was within normal limits.  We will continue vitamin B12 supplements for now and recheck a vitamin B12 level at her next visit

## 2022-08-08 NOTE — Patient Instructions (Addendum)
-   It was a pleasure seeing you today. - Your BP is normal today. Given that you had a recent episode of lightheadedness and had a low BP we will adjust your blood pressure medications. I will start you on lisinopril 40 mg and decrease your HCTZ to 25 mg. Please stop taking the combination pill lisinopril/HCTZ - Please try and stay hydrated with water and cut back on the sodas - I will refer you to a therapist to help with the stress you are feeling. Please continue your medications from Dr. Casimiro Needle  - Please follow up with me in 3 months - Please call with any questions or concerns

## 2022-08-08 NOTE — Assessment & Plan Note (Signed)
-   This problem is chronic and stable -Patient denies any complaints at this time -We will continue with rosuvastatin 20 mg daily -No further work-up for now

## 2022-08-08 NOTE — Assessment & Plan Note (Signed)
-   This problem is chronic and stable -Patient states that her insomnia has improved and that she takes trazodone only as needed -She follows up with Dr. Judithann Graves for this -No further work-up at this time

## 2022-08-08 NOTE — Progress Notes (Signed)
   Subjective:    Patient ID: Mia Underwood, female    DOB: August 07, 1951, 71 y.o.   MRN: [id number]  HPI  I have seen and examined this patient.  Patient is here for routine follow-up of her hypertension and depression.  Patient states that she recently saw Dr. Judithann Graves and he recommended that she see a new therapist since Dr. Carolynne Edouard is no longer available.  She states that she is has a lot of stress at home and needs help with this.  Patient also notes an episode of low blood pressure last week and felt like her legs were giving out under her.  She admits to poor hydration and drinks a lot of soda instead of water.  Patient denies any other complaints at this time and states that she feels well today.  Review of Systems  Constitutional: Negative.   HENT: Negative.    Respiratory: Negative.    Cardiovascular: Negative.   Gastrointestinal: Negative.   Musculoskeletal: Negative.   Neurological:  Positive for light-headedness.  Psychiatric/Behavioral: Negative.         Objective:   Physical Exam Constitutional:      Appearance: Normal appearance.  HENT:     Head: Normocephalic and atraumatic.  Cardiovascular:     Rate and Rhythm: Normal rate and regular rhythm.     Heart sounds: Normal heart sounds.  Pulmonary:     Effort: No respiratory distress.     Breath sounds: Normal breath sounds. No wheezing.  Abdominal:     General: Bowel sounds are normal. There is no distension.     Palpations: Abdomen is soft.     Tenderness: There is no abdominal tenderness.  Musculoskeletal:        General: No swelling or tenderness.  Neurological:     Mental Status: She is alert and oriented to person, place, and time.  Psychiatric:        Mood and Affect: Mood normal.        Behavior: Behavior normal.           Assessment & Plan:   Please see problem based charting for assessment and plan:

## 2022-08-08 NOTE — Assessment & Plan Note (Signed)
-   Flu shot given today -Patient encouraged to get shingles vaccine at her pharmacy -I also explained to her that the new COVID booster has received FDA approval and should be available at the end of September.  Given her risk for severe COVID infection given her comorbidities I advised her to take this booster and she states that she will

## 2022-08-08 NOTE — Assessment & Plan Note (Signed)
BP Readings from Last 3 Encounters:  08/08/22 113/65  08/04/22 123/73  04/04/22 (!) 149/71    Lab Results  Component Value Date   NA 138 04/04/2022   K 4.1 04/04/2022   CREATININE 0.71 04/04/2022    Assessment: Blood pressure control:  Well-controlled Progress toward BP goal:   At goal Comments: Patient is compliant with lisinopril/HCTZ 20/25 mg daily.  She did have an episode of lightheadedness and felt like her legs were giving out last week.  She was seen in urgent care and was told that her blood pressure was on the lower side.  Plan: Medications: We will decrease lisinopril to 20 mg daily and hydrochlorothiazide to 25 mg daily and monitor blood pressure.  If she tolerates this well we will try the combination pill of lisinopril/HCTZ 20/25 mg Educational resources provided:   Self management tools provided:   Other plans: We will check BMP at next visit.

## 2022-08-08 NOTE — Assessment & Plan Note (Addendum)
-   This problem is chronic and uncontrolled -Patient has an elevated PHQ-9 score of 15 which is similar to her last visit -Patient complains of a lot of stress at home including with a grandson that is coming out of prison and a granddaughter who is causing some financial stress for her -She had a recent follow-up with her psychiatrist Dr. Casimiro Needle for this who recommended she continue her current dose of sertraline 100 mg daily -Patient also takes trazodone as needed at night for her insomnia -Patient needs to follow-up with Dr. Theodis Shove for therapy but Dr. Theodis Shove is no longer with this clinic and patient requests a new referral to a therapist -Referral to psychology placed today. -Patient will continue to follow-up with Dr. Casimiro Needle as well -No further work-up for now

## 2022-08-08 NOTE — Assessment & Plan Note (Signed)
-   This problem is chronic and stable -Patient continues to smoke 1 pack a week -She states that she is doing this as she is under a lot of stress -We talked about the benefits of quitting smoking but I think that patient will need to get her depression and stress under control prior to quitting -We will attempt to address this at her next visit

## 2022-08-16 ENCOUNTER — Encounter: Payer: Medicare HMO | Admitting: Psychology

## 2022-08-16 ENCOUNTER — Telehealth: Payer: Self-pay

## 2022-08-16 NOTE — Progress Notes (Signed)
This encounter was created in error - please disregard.

## 2022-08-16 NOTE — Telephone Encounter (Signed)
Patient called regarding her appt. With Dr.Osama, patient wanted me to let you know that "Dr.Osama has not followed through and she has been waiting to hear from the doctor" patients phone service is going in and out unable to ask further questions please give patient a call

## 2022-08-18 ENCOUNTER — Encounter: Payer: Medicare HMO | Admitting: Psychology
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
## 2022-08-21 ENCOUNTER — Telehealth: Payer: Self-pay | Admitting: *Deleted

## 2022-08-21 DIAGNOSIS — F339 Major depressive disorder, recurrent, unspecified: Secondary | ICD-10-CM

## 2022-08-21 DIAGNOSIS — F5105 Insomnia due to other mental disorder: Secondary | ICD-10-CM

## 2022-08-21 NOTE — Telephone Encounter (Signed)
Patient states Dr. Alene Mires is not keeping his appointments with her. Please find her someone else to go to.

## 2022-08-21 NOTE — Progress Notes (Signed)
This encounter was created in error - please disregard.

## 2022-09-18 ENCOUNTER — Ambulatory Visit (INDEPENDENT_AMBULATORY_CARE_PROVIDER_SITE_OTHER): Payer: Medicare HMO | Admitting: Psychology

## 2022-09-18 DIAGNOSIS — F3341 Major depressive disorder, recurrent, in partial remission: Secondary | ICD-10-CM

## 2022-09-18 DIAGNOSIS — R69 Illness, unspecified: Secondary | ICD-10-CM | POA: Diagnosis not present
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
## 2022-09-27 ENCOUNTER — Ambulatory Visit (HOSPITAL_BASED_OUTPATIENT_CLINIC_OR_DEPARTMENT_OTHER): Payer: Medicare HMO | Admitting: Psychiatry

## 2022-09-27 DIAGNOSIS — F324 Major depressive disorder, single episode, in partial remission: Secondary | ICD-10-CM

## 2022-09-27 DIAGNOSIS — R69 Illness, unspecified: Secondary | ICD-10-CM | POA: Diagnosis not present

## 2022-09-27 MED ORDER — SERTRALINE HCL 100 MG PO TABS: ORAL_TABLET | ORAL | 5 refills | Status: DC

## 2022-09-27 MED ORDER — HYDROXYZINE PAMOATE 25 MG PO CAPS: ORAL_CAPSULE | ORAL | 5 refills | Status: DC

## 2022-09-27 NOTE — Progress Notes (Signed)
Psychiatric Initial Adult Assessment   Patient Identification: Mia Underwood MRN:  [id number] Date of Evaluation:  09/27/2022 Referral Source: Eustaquio Maize McKinzie Chief Complaint: Clinical dg along well as best she can. Virtual Visit via Telephone Note       Today for the most part the patient is doing better.  She seems to have calmed down about unable.  Her neighbor was apparently cold and rude to her.  The patient very sensitive about it.  The patient seems to have gotten through it.  This is a neighbor who she sees every day.  The other issue was related to her grandchild who has 2 children who are her great grandson children.  Her grandchild wanted her to babysit for them.  The 2 young children are ages 54 and 2 and with obviously a lot of the patient handled.  Patient says the family is chaotic.  She says her granddaughter has an up-and-down relationship with the father of the children.  Patient protect herself and says that not to be involved in babysitting.  Further her granddaughter had a car accident with her car and the patient therefore lost her car.  She had actually another car that she is using.  But is very clear that her family seems to be overly dependent upon her and the patient seems to be very independent.  The patient likes to read she plays backgammon.  She gets on the computer.  She likes to do crosswords.  She does word search.  She says that when she takes the trazodone to sleep she wakes up in the morning with a headache.  If she does not take the trazodone she has no headache.  She is eating well and has good energy.  She drinks no alcohol and uses no drugs.  The patient is actually functioning very well.  She has a new therapist Mr. Ozmend.  She seems to think it will work out.  So she is back in therapy which is important.  She takes her Zoloft just as prescribed.   I connected with Mia Underwood on 09/27/22 at  2:30 PM EDT by telephone and verified that I am speaking with  the correct person using two identifiers.  Location: Patient: home Provider: office   I discussed the limitations, risks, security and privacy concerns of performing an evaluation and management service by telephone and the availability of in person appointments. I also discussed with the patient that there may be a patient responsible charge related to this service. The patient expressed understanding and agreed to proceed.  ructions:    I discussed the assessment and treatment plan with the patient. The patient was provided an opportunity to ask questions and all were answered. The patient agreed with the plan and demonstrated an understanding of the instructions.   The patient was advised to call back or seek an in-person evaluation if the symptoms worsen or if the condition fails to improve as anticipated.  I provided 15 minutes of non-face-to-face time during this encounter.   Jerral Ralph, MD  Past Psychiatric History:   Previous Psychotropic Medications: Yes   Substance Abuse History in the last 12 months:  Yes.    Consequences of Substance Abuse:   Past Medical History:  Past Medical History:  Diagnosis Date   Allergic rhinitis 05/04/2016   Arthritis    Bilateral carpal tunnel syndrome 10/18/2010   Left > Right    Colles' fracture of left radius 07/01/2019   Diverticulosis  of sigmoid colon 06/07/2010   Essential hypertension 09/14/2009   Hyperlipidemia 09/14/2009   Insomnia secondary to depression with anxiety 05/04/2016   Major depression, recurrent, chronic ([place]) 09/14/2009   Marijuana abuse 04/04/2016   Muscle spasm of left shoulder 02/03/2019   Tobacco abuse 01/17/2016   Upper respiratory infection, viral 02/03/2019   Vitamin D deficiency 04/04/2016    Past Surgical History:  Procedure Laterality Date   ABDOMINAL HYSTERECTOMY  April 1996   For fibroids   COLONOSCOPY  07/18/2010   Patterson-tic's    Family Psychiatric History:   Family History:  Family History   Problem Relation Age of Onset   Breast cancer Mother    Depression Mother    Hypertension Mother    Dementia Mother    Alcoholism Father        Died in his 66's related to alcohol abuse   Leukemia Sister 46   Early death Brother        Died in a Teacher, music accident   Heart attack Daughter 59       Per patient report, specifics unknown   Diabetes Mellitus II Maternal Grandmother    Diabetes Mellitus II Paternal Grandmother    Healthy Sister    Healthy Sister    Arthritis Brother    Colon polyps Brother    Healthy Brother    Healthy Brother    Healthy Son    Breast cancer Maternal Aunt    Breast cancer Cousin    Breast cancer Maternal Aunt    Colon cancer Neg Hx    Esophageal cancer Neg Hx    Rectal cancer Neg Hx    Stomach cancer Neg Hx     Social History:   Social History   Socioeconomic History   Marital status: Single    Spouse name: Not on file   Number of children: 2   Years of education: 12 grade   Highest education level: Not on file  Occupational History   Occupation: Leisure centre manager: [employer]    Comment: Retired  Tobacco Use   Smoking status: Some Days    Packs/day: 0.20    Types: Cigarettes    Start date: 12/02/1973   Smokeless tobacco: Never   Tobacco comments:    Stopped in 1980 restarted in 1985. 1pack every 8 days.  Stopped on 01/20/2016. Restarted again. Now 1 pack per week  Vaping Use   Vaping Use: Never used  Substance and Sexual Activity   Alcohol use: Yes    Alcohol/week: 2.0 standard drinks of alcohol    Types: 1 Glasses of wine, 1 Cans of beer per week    Comment: drinks socially with friends   Drug use: No   Sexual activity: Not Currently  Other Topics Concern   Not on file  Social History Narrative   Graduated from [employer] in [place].  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      Current Social History 03/01/2021   Patient lives with granddaughter in an apartment on the first floor. There are not  steps up to the entrance the patient uses.       Patient's method of transportation is personal car.      The highest level of education was some college.      The patient currently retired.      Identified important Relationships are "My brother, my son"       Pets : None,  but I would like to have one       Interests / Fun: Love to read, play solitaire, small get-togethers with sisters       Current Stressors: My community is getting violent, strained family relationships      Religious / Personal Beliefs: Baptized [employer]       Social Determinants of Health   Financial Resource Strain: Not on file  Food Insecurity: Not on file  Transportation Needs: Not on file  Physical Activity: Not on file  Stress: Not on file  Social Connections: Not on file    Additional Social History:   Allergies:   Allergies  Allergen Reactions   Penicillins Hives   Naproxen Rash    Pt states she takes Aleve without problems    Metabolic Disorder Labs: No results found for: "HGBA1C", "MPG" No results found for: "PROLACTIN" Lab Results  Component Value Date   CHOL 167 04/04/2022   TRIG 76 04/04/2022   HDL 57 04/04/2022   CHOLHDL 2.9 04/04/2022   VLDL 20 11/01/2010   LDLCALC 96 04/04/2022   LDLCALC 70 06/20/2021     Current Medications: Current Outpatient Medications  Medication Sig Dispense Refill   cetirizine (ZYRTEC) 10 MG tablet TAKE 1 TABLET(10 MG) BY MOUTH AT BEDTIME 90 tablet 3   gabapentin (NEURONTIN) 600 MG tablet TAKE 1 TABLET(600 MG) BY MOUTH TWICE DAILY 180 tablet 3   hydrOXYzine (VISTARIL) 25 MG capsule 1 qhs PRN 30 capsule 5   amLODipine (NORVASC) 10 MG tablet Take 1 tablet (10 mg total) by mouth daily. 90 tablet 3   calcium carbonate (OS-CAL) 600 MG TABS tablet Take 1 tablet (600 mg total) by mouth 2 (two) times daily with a meal. 180 tablet 3   Cholecalciferol (VITAMIN D) 50 MCG (2000 UT) tablet Take 1 tablet (2,000 Units total) by mouth daily. 90 tablet  3   diclofenac Sodium (VOLTAREN) 1 % GEL Apply 4 g topically 4 (four) times daily. 150 g 3   hydrochlorothiazide (HYDRODIURIL) 25 MG tablet Take 1 tablet (25 mg total) by mouth daily. 90 tablet 0   lisinopril (ZESTRIL) 20 MG tablet Take 1 tablet (20 mg total) by mouth daily. 90 tablet 0   rosuvastatin (CRESTOR) 20 MG tablet Take 1 tablet (20 mg total) by mouth daily. 90 tablet 3   sertraline (ZOLOFT) 100 MG tablet 1  qam 60 tablet 5   vitamin B-12 (CYANOCOBALAMIN) 1000 MCG tablet Take 1 tablet (1,000 mcg total) by mouth daily. 30 tablet 0   No current facility-administered medications for this visit.    Neurologic: Headache: No Seizure: No Paresthesias:No  Musculoskeletal: Strength & Muscle Tone: within normal limits Gait & Station: normal Patient leans: N/A  Psychiatric Specialty Exam: ROS  There were no vitals taken for this visit.There is no height or weight on file to calculate BMI.  General Appearance: Casual  Eye Contact:  Fair  Speech:  Clear and Coherent  Volume:  Normal  Mood:  Depressed  Affect:  Blunt  Thought Process:  Goal Directed  Orientation:  Full (Time, Place, and Person)  Thought Content:  WDL  Suicidal Thoughts:  No  Homicidal Thoughts:  No  Memory:  NA  Judgement:  Good  Insight:  Good  Psychomotor Activity:  Normal  Concentration:    Recall:  [place] of Knowledge:Good  Language: Poor  Akathisia:  No  Handed:  Right  AIMS (if indicated):    Assets:  Desire for Improvement  ADL's:  Intact  Cognition: WNL  Sleep:      Treatment Plan Summary: 11/1/20233:11 PM    Today the patient is doing fairly well.  She is more resilient.  She will continue taking Zoloft 100 mg.  Today we will discontinue her trazodone and begin her on Vistaril 25 mg nightly as needed.  She will continue in one-to-one therapy and she will return to see me in 4 months.  I think the patient is doing better and functioning better.  He is able to set up boundaries from her  family and her toxic neighbors.

## 2022-09-29 ENCOUNTER — Telehealth (HOSPITAL_COMMUNITY): Payer: Self-pay | Admitting: *Deleted

## 2022-09-29 NOTE — Telephone Encounter (Signed)
PA FOR HYDROXYZINE 25 MG CAPSULES 1 QHS PRN #30 SUBMITTED TO MEDICARE VIA COVER MY MEDS.  AWAITING DETERMINATION.

## 2022-10-09 ENCOUNTER — Ambulatory Visit (INDEPENDENT_AMBULATORY_CARE_PROVIDER_SITE_OTHER): Payer: Medicare HMO | Admitting: Psychology

## 2022-10-09 DIAGNOSIS — R69 Illness, unspecified: Secondary | ICD-10-CM | POA: Diagnosis not present

## 2022-10-09 DIAGNOSIS — F324 Major depressive disorder, single episode, in partial remission: Secondary | ICD-10-CM | POA: Diagnosis not present

## 2022-10-09 NOTE — Progress Notes (Signed)
[facility] Counselor/Therapist Progress Note  Patient ID: ERA PARR, MRN: [id number]   Date: 10/09/22  Time Spent: 4:37  pm - 5:22 pm : 39 Minutes  Treatment Type: Individual Therapy.  Reported Symptoms: depression, anxiety, and interpersonal stressors  Mental Status Exam: Appearance:  NA      Behavior: Appropriate  Motor: NA  Speech/Language:  Clear and Coherent  Affect: Congruent  Mood: normal  Thought process: normal  Thought content:   WNL  Sensory/Perceptual disturbances:   WNL  Orientation: oriented to person, place, time/date, and situation  Attention: Good  Concentration: Good  Memory: WNL  Fund of knowledge:  Good  Insight:   Good  Judgment:  Good  Impulse Control: Good   Risk Assessment: Danger to Self:  No Self-injurious Behavior: No Danger to Others: No Duty to Warn:no Physical Aggression / Violence:No  Access to Firearms a concern: No  Gang Involvement:No   Subjective:   Mia Underwood participated from home, via phone and consented to treatment. Therapist participated from office. We met online due to [place] pandemic. Mia Underwood reviewed the events of the past week. We reviewed numerous treatment approaches including CBT, BA, Problem Solving, and Solution focused therapy. Psych-education regarding the Mia Underwood's diagnosis of Major depressive disorder with single episode, in partial remission ([place]) was provided during the session. We discussed Mia Underwood's goals treatment goals which include manage mood, reduce frustration tolerance, manage irritability, process passed events, build and maintain boundaries, and manage current interpersonal stressors.  Mia Underwood provided verbal approval of the treatment plan. She provided history regarding caring for hier grandchildren, a lack of boundaries, and feeling taken advantage of by her grandchildren.   Interventions: Psycho-education & Goal Setting.   Diagnosis:  Major depressive disorder with  single episode, in partial remission [name]   Treatment Plan:  Client Abilities/Strengths Mia Underwood is forthcoming and motivated for change.   Support System: Family and friends.   Client Treatment Preferences Outpatient therapy  Client Statement of Needs Mia Underwood would like to manage mood, reduce frustration tolerance, manage irritability, process passed events, build and maintain boundaries, and manage current interpersonal stressors.    Treatment Level Weekly  Symptoms  Anxiety: feeling nervous, difficulty managing worry, worrying about different things, trouble relaxing, restlessness, irritability, feeling afraid something awful might happen   (Status: maintained)  Depression: loss of interest, feeling down, trouble sleeping, lethargy, overeating, feeling bad about self, difficulty concentrating, psychomotor agitation   (Status: maintained)  Goals:   Mia Underwood experiences symptoms of  depression and anxiety   Target Date: 10/10/23 Frequency: Weekly  Progress: 0 Modality: individual    Therapist will provide referrals for additional resources as appropriate.  Therapist will provide psycho-education regarding Mia Underwood's diagnosis and corresponding treatment approaches and interventions. Licensed Clinical Social Worker, [place], LCSW will support the patient's ability to achieve the goals identified. will employ CBT, BA, Problem-solving, Solution Focused, Mindfulness,  coping skills, & other evidenced-based practices will be used to promote progress towards healthy functioning to help manage decrease symptoms associated with his diagnosis.   Reduce overall level, frequency, and intensity of the feelings of depression, anxiety and panic evidenced by decreased overall symptoms from 6 to 7 days/week to 0 to 1 days/week per client report for at least 3 consecutive months. Verbally express understanding of the relationship between feelings of depression, anxiety and their impact on thinking  patterns and behaviors. Verbalize an understanding of the role that distorted thinking plays in creating fears, excessive worry, and  ruminations.    Mia Underwood participated in the creation of the treatment plan)    [name], LCSW

## 2022-10-23 ENCOUNTER — Ambulatory Visit (INDEPENDENT_AMBULATORY_CARE_PROVIDER_SITE_OTHER): Payer: Medicare HMO | Admitting: Psychology

## 2022-10-23 DIAGNOSIS — R69 Illness, unspecified: Secondary | ICD-10-CM | POA: Diagnosis not present

## 2022-10-23 DIAGNOSIS — F324 Major depressive disorder, single episode, in partial remission: Secondary | ICD-10-CM | POA: Diagnosis not present

## 2022-10-23 NOTE — Progress Notes (Signed)
Mia Underwood Progress Note  Patient ID: Mia Underwood, MRN: [id number]   Date: 10/23/22  Time Spent: 1:40 pm - 2:22 pm : 42 Minutes  Treatment Type: Individual Therapy.  Reported Symptoms: depression, anxiety, and interpersonal stressors  Mental Status Exam: Appearance:  NA      Behavior: Appropriate  Motor: NA  Speech/Language:  Clear and Coherent  Affect: Depressed  Mood: depressed  Thought process: normal  Thought content:   WNL  Sensory/Perceptual disturbances:   WNL  Orientation: oriented to person, place, time/date, and situation  Attention: Good  Concentration: Good  Memory: WNL  Fund of knowledge:  Good  Insight:   Good  Judgment:  Good  Impulse Control: Good   Risk Assessment: Danger to Self:  No Self-injurious Behavior: No Danger to Others: No Duty to Warn:no Physical Aggression / Violence:No  Access to Firearms a concern: No  Gang Involvement:No   Subjective:   Mia Underwood participated from home, via phone and consented to treatment. Therapist participated from home office. We met online due to Mia Underwood pandemic. Mia Underwood reviewed the events of the past week. She noted continued stressors with her youngest granddaughter and noted a need to discontinue contact with Mia Underwood. She noted not being mad by being hurt. She noted ignoring her phone calls and visits. She noted her grand-daughter often cursing at her and withholding her children, her grandchildren, when upset.  She noted the history of her granddaughter being verbally antagonistic when she does not get her way.  She discussed that this relationship has not been reciprocal for some time and that her granddaughter often asked for financial assistance and other types of assistance without any return, appreciation, or reciprocation.  She noted her interest in building and maintaining boundaries going forward.  We explored this during the session.  She noted frustration regarding her  granddaughter's behavior and noted that this is not how her mother, Mia Underwood's daughter, raised her.  Therapist validated and normalized on his feelings and experience.  We discussed the importance of setting and maintaining boundaries.  We will work on this going forward.  We scheduled a follow-up for continued treatment as Mia Underwood benefits from sessions.  Therapist provided supportive therapy.  Interventions: interpersonal. .   Diagnosis:  Major depressive disorder with single episode, in partial remission Mia Underwood)   Treatment Plan:  Client Abilities/Strengths Mia Underwood is forthcoming and motivated for change.   Support System: Family and friends.   Client Treatment Preferences Outpatient therapy  Client Statement of Needs Mia Underwood would like to manage mood, reduce frustration tolerance, manage irritability, process passed events, build and maintain boundaries, and manage current interpersonal stressors.    Treatment Level Weekly  Symptoms  Anxiety: feeling nervous, difficulty managing worry, worrying about different things, trouble relaxing, restlessness, irritability, feeling afraid something awful might happen   (Status: maintained)  Depression: loss of interest, feeling down, trouble sleeping, lethargy, overeating, feeling bad about self, difficulty concentrating, psychomotor agitation   (Status: maintained)  Goals:   Mia Underwood experiences symptoms of  depression and anxiety   Target Date: 10/10/23 Frequency: Weekly  Progress: 0 Modality: individual    Therapist will provide referrals for additional resources as appropriate.  Therapist will provide psycho-education regarding Mia Underwood's diagnosis and corresponding treatment approaches and interventions. Licensed Clinical Social Worker, [place], LCSW will support the patient's ability to achieve the goals identified. will employ CBT, BA, Problem-solving, Solution Focused, Mindfulness,  coping skills, & other evidenced-based practices  will be used to promote progress  towards healthy functioning to help manage decrease symptoms associated with his diagnosis.   Reduce overall level, frequency, and intensity of the feelings of depression, anxiety and panic evidenced by decreased overall symptoms from 6 to 7 days/week to 0 to 1 days/week per client report for at least 3 consecutive months. Verbally express understanding of the relationship between feelings of depression, anxiety and their impact on thinking patterns and behaviors. Verbalize an understanding of the role that distorted thinking plays in creating fears, excessive worry, and ruminations.    Mia Underwood participated in the creation of the treatment plan)    Mia Irish, LCSW

## 2022-11-06 ENCOUNTER — Ambulatory Visit (INDEPENDENT_AMBULATORY_CARE_PROVIDER_SITE_OTHER): Payer: Medicare HMO | Admitting: Psychology

## 2022-11-06 DIAGNOSIS — F324 Major depressive disorder, single episode, in partial remission: Secondary | ICD-10-CM | POA: Diagnosis not present

## 2022-11-06 DIAGNOSIS — R69 Illness, unspecified: Secondary | ICD-10-CM | POA: Diagnosis not present

## 2022-11-06 NOTE — Progress Notes (Signed)
[facility] Counselor/Therapist Progress Note  Patient ID: Mia Underwood, MRN: [id number]   Date: 11/06/22  Time Spent: 1:32 pm - 2:08 pm : 36 Minutes  Treatment Type: Individual Therapy.  Reported Symptoms: depression, anxiety, and interpersonal stressors  Mental Status Exam: Appearance:  NA      Behavior: Appropriate  Motor: NA  Speech/Language:  Clear and Coherent  Affect: Depressed  Mood: depressed  Thought process: normal  Thought content:   WNL  Sensory/Perceptual disturbances:   WNL  Orientation: oriented to person, place, time/date, and situation  Attention: Good  Concentration: Good  Memory: WNL  Fund of knowledge:  Good  Insight:   Good  Judgment:  Good  Impulse Control: Good   Risk Assessment: Danger to Self:  No Self-injurious Behavior: No Danger to Others: No Duty to Warn:no Physical Aggression / Violence:No  Access to Firearms a concern: No  Gang Involvement:No   Subjective:   Mia Underwood participated from home, via phone and consented to treatment. Therapist participated from home office. We met online due to [place] pandemic. Mia Underwood reviewed the events of the past week. She noted maintaining boundaries with [name] and needing to set boundaries with her other grandchildren. She noted feeling unappreciated, cared for, or respected. She noted asking her grand-daughter to move out due to this strain. We explored their relationship  during the session and additional boundaries to set. Therapist validated Mia Underwood's feelings and experience. We discussed ways to continue maintaining boundaries. Mia Underwood was engaged and motivated during the session. She expressed commitment towards our goals. Therapist praised Mia Underwood nd provided supportive therapy. A follow-up was scheduled for continued treatment.   Interventions: interpersonal. .   Diagnosis:  Major depressive disorder with single episode, in partial remission [facility])   Treatment Plan:  Client  Abilities/Strengths Mia Underwood is forthcoming and motivated for change.   Support System: Family and friends.   Client Treatment Preferences Outpatient therapy  Client Statement of Needs Mia Underwood would like to manage mood, reduce frustration tolerance, manage irritability, process passed events, build and maintain boundaries, and manage current interpersonal stressors.    Treatment Level Weekly  Symptoms  Anxiety: feeling nervous, difficulty managing worry, worrying about different things, trouble relaxing, restlessness, irritability, feeling afraid something awful might happen   (Status: maintained)  Depression: loss of interest, feeling down, trouble sleeping, lethargy, overeating, feeling bad about self, difficulty concentrating, psychomotor agitation   (Status: maintained)  Goals:   Mia Underwood experiences symptoms of  depression and anxiety   Target Date: 10/10/23 Frequency: Weekly  Progress: 0 Modality: individual    Therapist will provide referrals for additional resources as appropriate.  Therapist will provide psycho-education regarding Mia Underwood's diagnosis and corresponding treatment approaches and interventions. Licensed Clinical Social Worker, [place], LCSW will support the patient's ability to achieve the goals identified. will employ CBT, BA, Problem-solving, Solution Focused, Mindfulness,  coping skills, & other evidenced-based practices will be used to promote progress towards healthy functioning to help manage decrease symptoms associated with his diagnosis.   Reduce overall level, frequency, and intensity of the feelings of depression, anxiety and panic evidenced by decreased overall symptoms from 6 to 7 days/week to 0 to 1 days/week per client report for at least 3 consecutive months. Verbally express understanding of the relationship between feelings of depression, anxiety and their impact on thinking patterns and behaviors. Verbalize an understanding of the role that  distorted thinking plays in creating fears, excessive worry, and ruminations.    [name] participated in the creation  of the treatment plan)    [name], LCSW

## 2022-11-07 ENCOUNTER — Encounter: Payer: Self-pay | Admitting: Internal Medicine

## 2022-11-07 ENCOUNTER — Ambulatory Visit (INDEPENDENT_AMBULATORY_CARE_PROVIDER_SITE_OTHER): Payer: Medicare HMO | Admitting: Internal Medicine

## 2022-11-07 VITALS — BP 187/72 | HR 61 | Temp 98.5°F | Ht 60.0 in | Wt 151.6 lb

## 2022-11-07 DIAGNOSIS — F339 Major depressive disorder, recurrent, unspecified: Secondary | ICD-10-CM

## 2022-11-07 DIAGNOSIS — E559 Vitamin D deficiency, unspecified: Secondary | ICD-10-CM

## 2022-11-07 DIAGNOSIS — I10 Essential (primary) hypertension: Secondary | ICD-10-CM | POA: Diagnosis not present

## 2022-11-07 DIAGNOSIS — Z0000 Encounter for general adult medical examination without abnormal findings: Secondary | ICD-10-CM

## 2022-11-07 DIAGNOSIS — G5792 Unspecified mononeuropathy of left lower limb: Secondary | ICD-10-CM

## 2022-11-07 DIAGNOSIS — F17210 Nicotine dependence, cigarettes, uncomplicated: Secondary | ICD-10-CM

## 2022-11-07 DIAGNOSIS — F418 Other specified anxiety disorders: Secondary | ICD-10-CM

## 2022-11-07 DIAGNOSIS — Z720 Tobacco use: Secondary | ICD-10-CM

## 2022-11-07 DIAGNOSIS — R69 Illness, unspecified: Secondary | ICD-10-CM | POA: Diagnosis not present

## 2022-11-07 DIAGNOSIS — F5105 Insomnia due to other mental disorder: Secondary | ICD-10-CM

## 2022-11-07 NOTE — Progress Notes (Signed)
Attestation for Student Documentation:  I personally was present and performed or re-performed the history, physical exam and medical decision-making activities of this service and have verified that the service and findings are accurately documented in the student's note.  Aldine Contes, MD 11/07/2022, 9:11 PM

## 2022-11-07 NOTE — Patient Instructions (Addendum)
Mia Underwood,  It was great to see you in clinic today.  Below is what we discussed today:  For your blood pressure - keep taking your medications consistently and we will see you again in two weeks to re-check your blood pressure. Please take your medication before your appointment. If you have any more falls or loss of sensation in your legs please go to the emergency room as these could be signs of serious illness such as a stroke We will do some blood work today to check Vitamin B12, Vitamin D, and kidney function  No changes to your medications today  It is a pleasure to be a part of your team, thank you for allowing [place] to be a part of your care,  Max and Dr. Dareen Piano  Please call our clinic if you have any questions or concerns, we may be able to help and keep you from a long and expensive emergency room wait. Our clinic and after hours phone number is [phone], the best time to call is Monday through Friday 9 am to 4 pm but there is always someone available 24/7 if you have an emergency.   If you need medication refills please notify your pharmacy one week in advance and they will send [place] a request.

## 2022-11-07 NOTE — Assessment & Plan Note (Addendum)
She denies neuropathy in her lower extremities today. Reports experiencing tingling in her hands bilaterally sometimes - this may be related to her history of bilateral carpal tunnel syndrome. Patient did have an episode this week where she fell to bedroom floor for a few minutes as she felt her legs were weak and ultimately gave out. She did not sustain any injury as she slowly sat to the floor. She denies tingling in her legs today. She reports taking Vitamin B12 supplements with no issue. Will re-check her Vitamin B12 level today, last level in May 2023 WNL.  Plan -Continue Vitamin B12 supplement 1000 mcg daily -Continue gabapentin 600 mg daily -F/u Vitamin B12 level obtained today

## 2022-11-07 NOTE — Assessment & Plan Note (Addendum)
Patient's BP in office at 182/97, re-check at 187/82 - not at goal. Patient reports she has not taken her medications this morning and was hurrying to her appointment, which could be contributing to elevated BP in office today. She also reports recent life stressors. She reports routinely taking her BP medications without issue. She denies chest pressure or acute changes in vision. Reports occasional chest aches around her left pectoralis area on exertion. She denies wheezing and difficulty breathing. Encouraged patient to continue taking her amlodipine, lisinopril, and HCTZ.  Patient also reports an episode this week where she fell to bedroom floor for a few minutes as she felt her legs were weak and ultimately gave out. She did not sustain any injury as she slowly sat to the floor. She denies tingling in her legs today. On exam, normal strength and tone in all four extremities. No obvious signs of stroke on exam today. Advised patient if episode like this reoccurs to visit ED given her elevated blood pressures. This episode could also be related to her history of neuropathy of left lower extremity.  Plan -Continue amlodipine 10 mg, lisinopril 20 mg, and HCTZ 25 mg daily -Follow-up in 2 weeks to re-check BP  -Re-visit if patient has any more episodes of falls or legs giving out -F/u BMP obtained today

## 2022-11-07 NOTE — Assessment & Plan Note (Signed)
-  Advised patient to receive COVID-19 booster at her pharmacy -Patient does not plan to receive Shingrix vaccine

## 2022-11-07 NOTE — Progress Notes (Signed)
Subjective:   Patient ID: Mia Underwood female   DOB: 09/06/1951 71 y.o.   MRN: [id number]  HPI: Mia Underwood is a 71 y.o. woman with medical history below who presents for routine follow-up of hypertension. Please see problem-based assessment and plan charting for further details.  Review of Systems: Pertinent items are noted in HPI of problem-based assessment and plan.  Past Medical History:  Diagnosis Date   Allergic rhinitis 05/04/2016   Arthritis    Bilateral carpal tunnel syndrome 10/18/2010   Left > Right    Colles' fracture of left radius 07/01/2019   Diverticulosis of sigmoid colon 06/07/2010   Essential hypertension 09/14/2009   Hyperlipidemia 09/14/2009   Insomnia secondary to depression with anxiety 05/04/2016   Major depression, recurrent, chronic (HCC) 09/14/2009   Marijuana abuse 04/04/2016   Muscle spasm of left shoulder 02/03/2019   Tobacco abuse 01/17/2016   Upper respiratory infection, viral 02/03/2019   Vitamin D deficiency 04/04/2016    Patient Active Problem List   Diagnosis Date Noted   Postmenopausal estrogen deficiency 04/04/2022   Hand cramps 04/04/2022   Osteopenia 12/16/2019   Neuropathy of left lower extremity 09/03/2019   Bilateral leg cramps 09/09/2018   Allergic rhinitis 05/04/2016   Insomnia secondary to depression with anxiety 05/04/2016   Marijuana abuse 04/04/2016   Vitamin D deficiency 04/04/2016   Tobacco abuse 01/17/2016   Healthcare maintenance 01/03/2016   Bilateral carpal tunnel syndrome 10/18/2010   Diverticulosis of sigmoid colon 06/07/2010   Hyperlipidemia 09/14/2009   Major depression, recurrent, chronic (HCC) 09/14/2009   Essential hypertension 09/14/2009     Current Outpatient Medications  Medication Sig Dispense Refill   cetirizine (ZYRTEC) 10 MG tablet TAKE 1 TABLET(10 MG) BY MOUTH AT BEDTIME 90 tablet 3   gabapentin (NEURONTIN) 600 MG tablet TAKE 1 TABLET(600 MG) BY MOUTH TWICE DAILY 180 tablet 3   amLODipine  (NORVASC) 10 MG tablet Take 1 tablet (10 mg total) by mouth daily. 90 tablet 3   calcium carbonate (OS-CAL) 600 MG TABS tablet Take 1 tablet (600 mg total) by mouth 2 (two) times daily with a meal. 180 tablet 3   Cholecalciferol (VITAMIN D) 50 MCG (2000 UT) tablet Take 1 tablet (2,000 Units total) by mouth daily. 90 tablet 3   diclofenac Sodium (VOLTAREN) 1 % GEL Apply 4 g topically 4 (four) times daily. 150 g 3   hydrochlorothiazide (HYDRODIURIL) 25 MG tablet Take 1 tablet (25 mg total) by mouth daily. 90 tablet 0   hydrOXYzine (VISTARIL) 25 MG capsule 1 qhs PRN 30 capsule 5   lisinopril (ZESTRIL) 20 MG tablet Take 1 tablet (20 mg total) by mouth daily. 90 tablet 0   rosuvastatin (CRESTOR) 20 MG tablet Take 1 tablet (20 mg total) by mouth daily. 90 tablet 3   sertraline (ZOLOFT) 100 MG tablet 1  qam 60 tablet 5   vitamin B-12 (CYANOCOBALAMIN) 1000 MCG tablet Take 1 tablet (1,000 mcg total) by mouth daily. 30 tablet 0   No current facility-administered medications for this visit.     Objective:   Physical Exam: Vitals:   11/07/22 1032 11/07/22 1136  BP: (!) 182/87 (!) 187/72  Pulse: 72 61  Temp: 98.5 F (36.9 C)   TempSrc: Oral   SpO2: 97%   Weight: 151 lb 9.6 oz (68.8 kg)   Height: 5' (1.524 m)     Constitutional: well-appearing, in no acute distress HENT: mucous membranes moist. Normal thyroid exam with no  palpable masses. Cardiovascular: regular rate with normal rhythm, no murmurs Pulmonary/Chest: normal work of breathing on room air, lungs clear to auscultation bilaterally Abdominal: soft, non-tender, non-distended, bowel sounds present MSK: mild non-pitting lower extremity edema. Appropriate strength and tone in all extremities Skin: warm and dry. Neurological: alert and answering questions appropriately.  Assessment & Plan:   Essential hypertension Patient's BP in office at 182/97, re-check at 187/82 - not at goal. Patient reports she has not taken her medications this  morning and was hurrying to her appointment, which could be contributing to elevated BP in office today. She also reports recent life stressors. She reports routinely taking her BP medications without issue. She denies chest pressure or acute changes in vision. Reports occasional chest aches around her left pectoralis area on exertion. She denies wheezing and difficulty breathing. Encouraged patient to continue taking her amlodipine, lisinopril, and HCTZ.  Patient also reports an episode this week where she fell to bedroom floor for a few minutes as she felt her legs were weak and ultimately gave out. She did not sustain any injury as she slowly sat to the floor. She denies tingling in her legs today. On exam, normal strength and tone in all four extremities. No obvious signs of stroke on exam today. Advised patient if episode like this reoccurs to visit ED given her elevated blood pressures. This episode could also be related to her history of neuropathy of left lower extremity.  Plan -Continue amlodipine 10 mg, lisinopril 20 mg, and HCTZ 25 mg daily -Follow-up in 2 weeks to re-check BP  -Re-visit if patient has any more episodes of falls or legs giving out -F/u BMP obtained today  Major depression, recurrent, chronic [facility]) Patient follows with her psychiatrist Dr. Donell Beers. PHQ-9 at 12 today. Patient reports that there is tension between her and granddaughter that she is working through. She reports typically sleeping from 9 pm to 2 am and sometimes cannot fall asleep after that. She recently visited Dr. Donell Beers in November. She reports no issues with taking her sertraline. Dr. Donell Beers recommended her to switch from trazodone to hydroxyzine as needed.  Plan -Continue sertraline 100 mg daily -Continue hydroxyzine 25 mg as needed  Neuropathy of left lower extremity She denies neuropathy in her lower extremities today. Reports experiencing tingling in her hands bilaterally sometimes - this may be  related to her history of bilateral carpal tunnel syndrome. Patient did have an episode this week where she fell to bedroom floor for a few minutes as she felt her legs were weak and ultimately gave out. She did not sustain any injury as she slowly sat to the floor. She denies tingling in her legs today. She reports taking Vitamin B12 supplements with no issue. Will re-check her Vitamin B12 level today, last level in May 2023 WNL.  Plan -Continue Vitamin B12 supplement 1000 mcg daily -Continue gabapentin 600 mg daily -F/u Vitamin B12 level obtained today  Healthcare maintenance -Advised patient to receive COVID-19 booster at her pharmacy -Patient does not plan to receive Shingrix vaccine   Hoang Pettingill (Max) Timmy Bubeck [facility] Student, MS3 11/07/2022, 2:25 PM

## 2022-11-07 NOTE — Assessment & Plan Note (Signed)
-   This problem is chronic and stable and likely related to underlying stressors at home - Patient reports sleeping form 9 PM to 2 AM and then waking up - She was switched off trazodone to hydroxyzine as needed by psych - Encouraged patient to continue this for now

## 2022-11-07 NOTE — Assessment & Plan Note (Signed)
-   Patient has a history of Vitamin D deficiency and is on Vitamin D 2000 units daily - Will recheck Vitamin D level today

## 2022-11-07 NOTE — Assessment & Plan Note (Addendum)
Patient follows with her psychiatrist Dr. Casimiro Needle. PHQ-9 at 12 today which is improved from her prior score of 15. Patient reports that there is tension between her and granddaughter that she is working through. She reports typically sleeping from 9 pm to 2 am and sometimes cannot fall asleep after that. She recently visited Dr. Casimiro Needle in November. She reports no issues with taking her sertraline. Dr. Casimiro Needle recommended her to switch from trazodone to hydroxyzine as needed.  Plan -Continue sertraline 100 mg daily -Continue hydroxyzine 25 mg as needed - Patient is also receiving therapy which appears to be benefiting her and we have encouraged her to continue to follow up with this

## 2022-11-07 NOTE — Assessment & Plan Note (Signed)
-   Patient states that she has quit smoking since September and was congratulated on this - She may benefit form low dose lung cancer screening. Will discuss this at her next visit

## 2022-11-09 LAB — BMP8+ANION GAP
Anion Gap: 15 mmol/L (ref 10.0–18.0)
BUN/Creatinine Ratio: 29 — ABNORMAL HIGH (ref 12–28)
BUN: 18 mg/dL (ref 8–27)
CO2: 22 mmol/L (ref 20–29)
Calcium: 9.1 mg/dL (ref 8.7–10.3)
Chloride: 109 mmol/L — ABNORMAL HIGH (ref 96–106)
Creatinine, Ser: 0.62 mg/dL (ref 0.57–1.00)
Glucose: 95 mg/dL (ref 70–99)
Potassium: 4.1 mmol/L (ref 3.5–5.2)
Sodium: 146 mmol/L — ABNORMAL HIGH (ref 134–144)
eGFR: 96 mL/min/{1.73_m2} (ref >59)

## 2022-11-09 LAB — VITAMIN D 25 HYDROXY (VIT D DEFICIENCY, FRACTURES): Vit D, 25-Hydroxy: 19.2 ng/mL — ABNORMAL LOW (ref 30.0–100.0)

## 2022-11-09 LAB — VITAMIN B12: Vitamin B-12: 286 pg/mL (ref 232–1245)

## 2022-11-15 MED ORDER — VITAMIN B-12 1000 MCG PO TABS: 1000.0000 ug | ORAL_TABLET | Freq: Every day | ORAL | 3 refills | Status: DC

## 2022-11-15 MED ORDER — VITAMIN D 50 MCG (2000 UT) PO TABS: 2000.0000 [IU] | ORAL_TABLET | Freq: Every day | ORAL | 3 refills | Status: DC

## 2022-11-15 NOTE — Addendum Note (Signed)
Addended by: Aldine Contes on: 11/15/2022 09:47 AM   Modules accepted: Orders

## 2022-11-22 ENCOUNTER — Ambulatory Visit (INDEPENDENT_AMBULATORY_CARE_PROVIDER_SITE_OTHER): Payer: Medicare HMO | Admitting: Student

## 2022-11-22 DIAGNOSIS — J302 Other seasonal allergic rhinitis: Secondary | ICD-10-CM

## 2022-11-22 DIAGNOSIS — R252 Cramp and spasm: Secondary | ICD-10-CM | POA: Diagnosis not present

## 2022-11-22 DIAGNOSIS — R69 Illness, unspecified: Secondary | ICD-10-CM | POA: Diagnosis not present

## 2022-11-22 DIAGNOSIS — F17210 Nicotine dependence, cigarettes, uncomplicated: Secondary | ICD-10-CM

## 2022-11-22 DIAGNOSIS — I10 Essential (primary) hypertension: Secondary | ICD-10-CM

## 2022-11-22 DIAGNOSIS — E7800 Pure hypercholesterolemia, unspecified: Secondary | ICD-10-CM

## 2022-11-22 DIAGNOSIS — G2581 Restless legs syndrome: Secondary | ICD-10-CM | POA: Diagnosis not present

## 2022-11-22 MED ORDER — GABAPENTIN 600 MG PO TABS: 600.0000 mg | ORAL_TABLET | Freq: Every day | ORAL | 3 refills | Status: DC

## 2022-11-22 MED ORDER — HYDROCHLOROTHIAZIDE 25 MG PO TABS: 25.0000 mg | ORAL_TABLET | Freq: Every day | ORAL | 0 refills | Status: DC

## 2022-11-22 MED ORDER — AMLODIPINE BESYLATE 10 MG PO TABS: 10.0000 mg | ORAL_TABLET | Freq: Every day | ORAL | 3 refills | Status: DC

## 2022-11-22 MED ORDER — CETIRIZINE HCL 10 MG PO TABS: 10.0000 mg | ORAL_TABLET | Freq: Every day | ORAL | 3 refills | Status: DC

## 2022-11-22 MED ORDER — ROSUVASTATIN CALCIUM 20 MG PO TABS: 20.0000 mg | ORAL_TABLET | Freq: Every day | ORAL | 3 refills | Status: DC

## 2022-11-22 NOTE — Assessment & Plan Note (Signed)
>>  ASSESSMENT AND PLAN FOR BILATERAL LEG CRAMPS WRITTEN ON 11/22/2022  3:28 PM BY BRASWELL, PHILLIP, MD  This has been a long-standing issue for [name]. She denies claudication symptoms. Reports cramping occurs throughout the day and night. Dorsalis pedis pulses bilaterally 2+ today in office. Will check electrolytes and iron studies today.  - BMP, Mg, TIBC/Fe/ferritin today

## 2022-11-22 NOTE — Progress Notes (Signed)
   CC: blood pressure follow-up  HPI:  MiaMia Underwood is a 71 y.o. person with medical history as below presenting to [facility] for blood pressure follow-up.  Please see problem-based list for further details, assessments, and plans.  Past Medical History:  Diagnosis Date   Allergic rhinitis 05/04/2016   Arthritis    Bilateral carpal tunnel syndrome 10/18/2010   Left > Right    Colles' fracture of left radius 07/01/2019   Diverticulosis of sigmoid colon 06/07/2010   Essential hypertension 09/14/2009   Hyperlipidemia 09/14/2009   Insomnia secondary to depression with anxiety 05/04/2016   Major depression, recurrent, chronic ([place]) 09/14/2009   Marijuana abuse 04/04/2016   Muscle spasm of left shoulder 02/03/2019   Tobacco abuse 01/17/2016   Upper respiratory infection, viral 02/03/2019   Vitamin D deficiency 04/04/2016   Review of Systems:  As per HPI  Physical Exam:  Vitals:   11/22/22 0923  BP: (!) 140/72  Pulse: 85  Temp: 99.8 F (37.7 C)  TempSrc: Oral  SpO2: 97%  Weight: 148 lb 11.2 oz (67.4 kg)  Height: 5' (1.524 m)   General: Resting comfortably in no acute distress CV: Normal rate, rhythm. No murmurs appreciated. DP pulses 2+ bilaterally. Pulm: Normal work of breathing on room air. Clear to auscultation bilaterally.  GI: Abdomen soft, non-tender, non-distended. Normoactive bowel sounds.  MSK: Normal bulk, tone. No peripheral edema. Skin: Warm, dry. No rashes or lesions. Neuro: Awake, alert, conversing appropriately. Psych: Normal mood, affect, speech.  Assessment & Plan:   Essential hypertension BP Readings from Last 3 Encounters:  11/22/22 (!) 140/72  11/07/22 (!) 187/72  08/08/22 113/65   Since last visit Mia Underwood has been compliant with her updated medications. Reports she was told during last visit to stop lisinopril. She denies chest pain, dyspnea, lightheadedness, syncope. Reports she has been working on setting boundaries to help her with her anxiety issues  related to family members. Given her age, co-morbidities, and recent history of dizziness d/t hypotension, she is likely at an appropriate BP goal now.  - Amlodipine '10mg'$  daily - Hydrochlorothiazide '25mg'$  daily - BMP today  Bilateral leg cramps This has been a long-standing issue for Mia Underwood. She denies claudication symptoms. Reports cramping occurs throughout the day and night. Dorsalis pedis pulses bilaterally 2+ today in office. Will check electrolytes and iron studies today.  - BMP, Mg, TIBC/Fe/ferritin today   Patient discussed with Dr.  Maurilio Lovely, MD Internal Medicine PGY-3 Pager: [phone]

## 2022-11-22 NOTE — Assessment & Plan Note (Addendum)
BP Readings from Last 3 Encounters:  11/22/22 (!) 140/72  11/07/22 (!) 187/72  08/08/22 113/65   Since last visit Mia Underwood has been compliant with her updated medications. Reports she was told during last visit to stop lisinopril. She denies chest pain, dyspnea, lightheadedness, syncope. Reports she has been working on setting boundaries to help her with her anxiety issues related to family members. Given her age, co-morbidities, and recent history of dizziness d/t hypotension, she is likely at an appropriate BP goal now.  - Amlodipine '10mg'$  daily - Hydrochlorothiazide '25mg'$  daily - BMP today

## 2022-11-22 NOTE — Patient Instructions (Signed)
[name], it was a pleasure seeing you today!  Today we discussed: - Blood pressure: Continue with your current medications. No changes today!  - Cramps: We will get lab work today to check for any causes for your cramps.  I have ordered the following labs today:   Lab Orders         Magnesium         Iron, TIBC and Ferritin Panel         BMP8+Anion Gap      I have ordered the following medication/changed the following medications:   Start the following medications: Meds ordered this encounter  Medications   amLODipine (NORVASC) 10 MG tablet    Sig: Take 1 tablet (10 mg total) by mouth daily.    Dispense:  90 tablet    Refill:  3   cetirizine (ZYRTEC) 10 MG tablet    Sig: Take 1 tablet (10 mg total) by mouth daily.    Dispense:  90 tablet    Refill:  3   gabapentin (NEURONTIN) 600 MG tablet    Sig: Take 1 tablet (600 mg total) by mouth daily.    Dispense:  90 tablet    Refill:  3   hydrochlorothiazide (HYDRODIURIL) 25 MG tablet    Sig: Take 1 tablet (25 mg total) by mouth daily.    Dispense:  90 tablet    Refill:  0   rosuvastatin (CRESTOR) 20 MG tablet    Sig: Take 1 tablet (20 mg total) by mouth daily.    Dispense:  90 tablet    Refill:  3     Follow-up: 3 months   Please make sure to arrive 15 minutes prior to your next appointment. If you arrive late, you may be asked to reschedule.   We look forward to seeing you next time. Please call our clinic at [phone] if you have any questions or concerns. The best time to call is Monday-Friday from 9am-4pm, but there is someone available 24/7. If after hours or the weekend, call the main hospital number and ask for the Internal Medicine Resident On-Call. If you need medication refills, please notify your pharmacy one week in advance and they will send [place] a request.  Thank you for letting us take part in your care. Wishing you the best!  Thank you, Sanjuan Dame, MD

## 2022-11-22 NOTE — Assessment & Plan Note (Signed)
This has been a long-standing issue for [name]. She denies claudication symptoms. Reports cramping occurs throughout the day and night. Dorsalis pedis pulses bilaterally 2+ today in office. Will check electrolytes and iron studies today.  - BMP, Mg, TIBC/Fe/ferritin today

## 2022-11-23 LAB — BMP8+ANION GAP
Anion Gap: 16 mmol/L (ref 10.0–18.0)
BUN/Creatinine Ratio: 23 (ref 12–28)
BUN: 14 mg/dL (ref 8–27)
CO2: 24 mmol/L (ref 20–29)
Calcium: 9.3 mg/dL (ref 8.7–10.3)
Chloride: 103 mmol/L (ref 96–106)
Creatinine, Ser: 0.61 mg/dL (ref 0.57–1.00)
Glucose: 100 mg/dL — ABNORMAL HIGH (ref 70–99)
Potassium: 4 mmol/L (ref 3.5–5.2)
Sodium: 143 mmol/L (ref 134–144)
eGFR: 96 mL/min/{1.73_m2} (ref >59)

## 2022-11-23 LAB — IRON,TIBC AND FERRITIN PANEL
Ferritin: 100 ng/mL (ref 15–150)
Iron Saturation: 29 % (ref 15–55)
Iron: 108 ug/dL (ref 27–139)
Total Iron Binding Capacity: 371 ug/dL (ref 250–450)
UIBC: 263 ug/dL (ref 118–369)

## 2022-11-23 LAB — MAGNESIUM: Magnesium: 2.2 mg/dL (ref 1.6–2.3)

## 2022-11-25 ENCOUNTER — Encounter: Payer: Self-pay | Admitting: *Deleted

## 2022-11-28 ENCOUNTER — Encounter: Payer: Self-pay | Admitting: Student

## 2022-11-28 ENCOUNTER — Ambulatory Visit (INDEPENDENT_AMBULATORY_CARE_PROVIDER_SITE_OTHER): Payer: Medicare PPO | Admitting: Psychology

## 2022-11-28 DIAGNOSIS — F324 Major depressive disorder, single episode, in partial remission: Secondary | ICD-10-CM | POA: Diagnosis not present

## 2022-11-28 NOTE — Progress Notes (Signed)
[facility] Counselor/Therapist Progress Note  Patient ID: SIMONNE BOULOS, MRN: [id number]   Date: 11/28/22  Time Spent: 9:06 am - 9:53 am : 68 Minutes  Treatment Type: Individual Therapy.  Reported Symptoms: depression, anxiety, and interpersonal stressors  Mental Status Exam: Appearance:  NA      Behavior: Appropriate  Motor: NA  Speech/Language:  Clear and Coherent  Affect: Appropriate  Mood: normal  Thought process: normal  Thought content:   WNL  Sensory/Perceptual disturbances:   WNL  Orientation: oriented to person, place, time/date, and situation  Attention: Good  Concentration: Good  Memory: WNL  Fund of knowledge:  Good  Insight:   Good  Judgment:  Good  Impulse Control: Good   Risk Assessment: Danger to Self:  No Self-injurious Behavior: No Danger to Others: No Duty to Warn:no Physical Aggression / Violence:No  Access to Firearms a concern: No  Gang Involvement:No   Subjective:   BLESSEN KIMBROUGH participated from home, via phone and consented to treatment. Therapist participated from office. We met online due to [place] pandemic. Lorrinda reviewed the events of the past week. She noted experiencing a good holiday season. She noted having time away from her grandchildren and noted this going well. She noted not being able to see her grandchildren. We began processing this during the session. Rayshell identified a lack of control in this area and acceptance. She noted being hopeful that this new year will be positive and enjoyable. She noted this being the anniversary of her mother's passing (Jan. 1st, 2023). She noted settling things with her mother prior to her passing. She noted some strain between her mother and grandmother, noting being in the middle of their arguments "all the time". She noted often being recruited by each side during arguments. She reflected on her past depressive symptoms as she cared for her young grand-children. She noted having a lot  of adult responsibility as a teenager as her mother was a single parent. She noted having 8 siblings. She noted a lack of empathy or support from her grandchildren which led to the disconnection between them. We continued to explore her relationship during the session. Vaniyah was engaged and motivated during the session. She expressed commitment towards our goals. Therapist praised Diala nd provided supportive therapy. A follow-up was scheduled for continued treatment.   Interventions: interpersonal. .   Diagnosis:  Major depressive disorder with single episode, in partial remission [facility])   Treatment Plan:  Client Abilities/Strengths Ludella is forthcoming and motivated for change.   Support System: Family and friends.   Client Treatment Preferences Outpatient therapy  Client Statement of Needs Chamika would like to manage mood, reduce frustration tolerance, manage irritability, process passed events, build and maintain boundaries, and manage current interpersonal stressors.    Treatment Level Weekly  Symptoms  Anxiety: feeling nervous, difficulty managing worry, worrying about different things, trouble relaxing, restlessness, irritability, feeling afraid something awful might happen   (Status: maintained)  Depression: loss of interest, feeling down, trouble sleeping, lethargy, overeating, feeling bad about self, difficulty concentrating, psychomotor agitation   (Status: maintained)  Goals:   Deberah experiences symptoms of  depression and anxiety   Target Date: 10/10/23 Frequency: Weekly  Progress: 0 Modality: individual    Therapist will provide referrals for additional resources as appropriate.  Therapist will provide psycho-education regarding Astella's diagnosis and corresponding treatment approaches and interventions. Licensed Clinical Social Worker, [place], LCSW will support the patient's ability to achieve the goals identified. will employ  CBT, BA, Problem-solving,  Solution Focused, Mindfulness,  coping skills, & other evidenced-based practices will be used to promote progress towards healthy functioning to help manage decrease symptoms associated with his diagnosis.   Reduce overall level, frequency, and intensity of the feelings of depression, anxiety and panic evidenced by decreased overall symptoms from 6 to 7 days/week to 0 to 1 days/week per client report for at least 3 consecutive months. Verbally express understanding of the relationship between feelings of depression, anxiety and their impact on thinking patterns and behaviors. Verbalize an understanding of the role that distorted thinking plays in creating fears, excessive worry, and ruminations.    [name] participated in the creation of the treatment plan)    [name], LCSW

## 2022-12-01 NOTE — Progress Notes (Signed)
Internal Medicine Clinic Attending ? ?Case discussed with Dr. Braswell  At the time of the visit.  We reviewed the resident?s history and exam and pertinent patient test results.  I agree with the assessment, diagnosis, and plan of care documented in the resident?s note.  ?

## 2022-12-13 ENCOUNTER — Ambulatory Visit (INDEPENDENT_AMBULATORY_CARE_PROVIDER_SITE_OTHER): Payer: Medicare PPO | Admitting: Psychology

## 2022-12-13 DIAGNOSIS — F324 Major depressive disorder, single episode, in partial remission: Secondary | ICD-10-CM | POA: Diagnosis not present

## 2022-12-13 NOTE — Progress Notes (Signed)
[facility] Counselor/Therapist Progress Note  Patient ID: Mia Underwood, MRN: [id number]   Date: 12/13/22  Time Spent: 9:04 am - 9:34 am : 30 Minutes  Treatment Type: Individual Therapy.  Reported Symptoms: depression, anxiety, and interpersonal stressors  Mental Status Exam: Appearance:  NA      Behavior: Appropriate  Motor: NA  Speech/Language:  Clear and Coherent  Affect: Appropriate  Mood: normal  Thought process: normal  Thought content:   WNL  Sensory/Perceptual disturbances:   WNL  Orientation: oriented to person, place, time/date, and situation  Attention: Good  Concentration: Good  Memory: WNL  Fund of knowledge:  Good  Insight:   Good  Judgment:  Good  Impulse Control: Good   Risk Assessment: Danger to Self:  No Self-injurious Behavior: No Danger to Others: No Duty to Warn:no Physical Aggression / Violence:No  Access to Firearms a concern: No  Gang Involvement:No   Subjective:   Mia Underwood participated from home, via phone and consented to treatment. Therapist participated from office. We met online due to [place] pandemic. Mia Underwood reviewed the events of the past week. She noted experiencing a good holiday season. She noted a lack of "trouble with the family". She noted focusing on getting tasks done around the home. She noted worry about a friend who recently had a significant procedure. She noted her efforts to engage in enjoyable activities such as crossword puzzles, reading books, and watching TV. She is slated to engage socially at a local senior center. She noted "half the time not being bothered" to engage in the aforementioned activities. She noted a need to "talk" self into engagement and taking care of self including hair dresser, getting nails done, identifying a dentist to address dental issues. We worked on identifying her ways of increasing motivation and engagement during the session. Mia Underwood will work on identifying possible sources of  her lack of motivation and how she has attempted to manage this in the past, between sessions, to be explored going forward. Therapist praised Mia Underwood for her effort and energy. Therapist provided supportive therapy.  A follow-up was scheduled for continued treatment.   Interventions: DBT  Diagnosis:  Major depressive disorder with single episode, in partial remission [facility])   Treatment Plan:  Client Abilities/Strengths Mia Underwood and motivated for change.   Support System: Family and friends.   Client Treatment Preferences Outpatient therapy  Client Statement of Needs Mia Underwood would like to manage mood, reduce frustration tolerance, manage irritability, process passed events, build and maintain boundaries, and manage current interpersonal stressors.    Treatment Level Weekly  Symptoms  Anxiety: feeling nervous, difficulty managing worry, worrying about different things, trouble relaxing, restlessness, irritability, feeling afraid something awful might happen   (Status: maintained)  Depression: loss of interest, feeling down, trouble sleeping, lethargy, overeating, feeling bad about self, difficulty concentrating, psychomotor agitation   (Status: maintained)  Goals:   Mia Underwood experiences symptoms of  depression and anxiety   Target Date: 10/10/23 Frequency: Weekly  Progress: 0 Modality: individual    Therapist will provide referrals for additional resources as appropriate.  Therapist will provide psycho-education regarding Mia Underwood diagnosis and corresponding treatment approaches and interventions. Licensed Clinical Social Worker, [place], LCSW will support the patient's ability to achieve the goals identified. will employ CBT, BA, Problem-solving, Solution Focused, Mindfulness,  coping skills, & other evidenced-based practices will be used to promote progress towards healthy functioning to help manage decrease symptoms associated with his diagnosis.   Reduce overall  level, frequency, and intensity of the feelings of depression, anxiety and panic evidenced by decreased overall symptoms from 6 to 7 days/week to 0 to 1 days/week per client report for at least 3 consecutive months. Verbally express understanding of the relationship between feelings of depression, anxiety and their impact on thinking patterns and behaviors. Verbalize an understanding of the role that distorted thinking plays in creating fears, excessive worry, and ruminations.    [name] participated in the creation of the treatment plan)    [name], LCSW

## 2022-12-17 IMAGING — CT CT CHEST LUNG CANCER SCREENING LOW DOSE W/O CM
1 series · 10 of 10 positions shown, 13 images · non-contrast
Comparison: None.

CLINICAL DATA: 69-year-old asymptomatic female current smoker with
30 pack-year smoking history.

EXAM:
CT CHEST WITHOUT CONTRAST LOW-DOSE FOR LUNG CANCER SCREENING
TECHNIQUE: Multidetector CT imaging of the chest was performed following the
standard protocol without IV contrast.

[ct lung segmentation data · axial · 0.66mm/px · z∈[+1242,+1242]mm · 10 of 303 frames shown]
[frame 1/303  mediastinal]
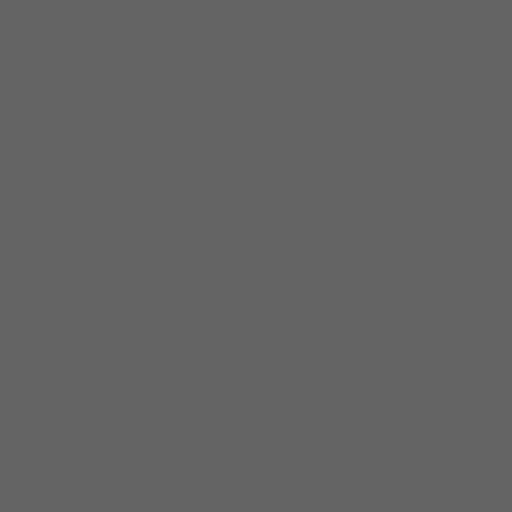
[frame 1/303  lung]
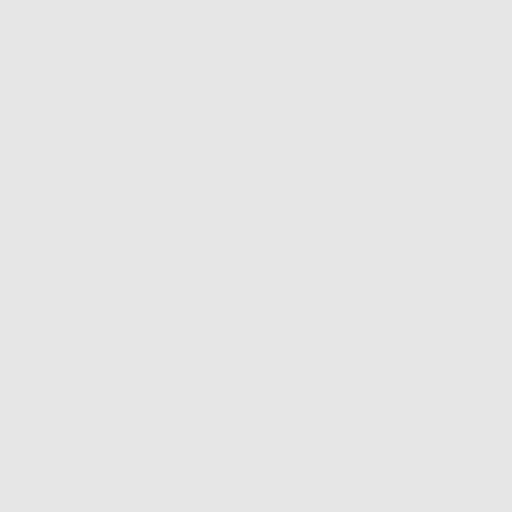
[frame 34/303  lung]
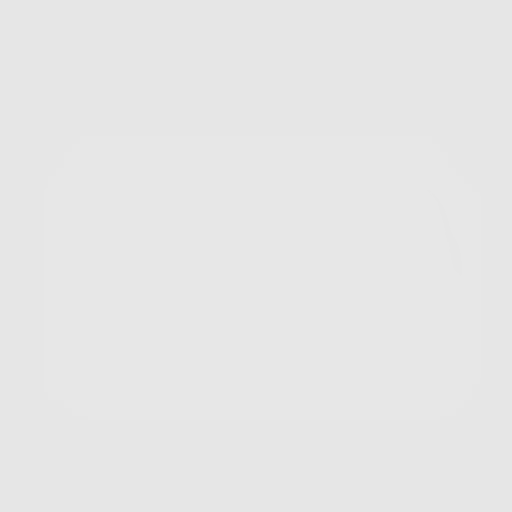
[frame 68/303  lung]
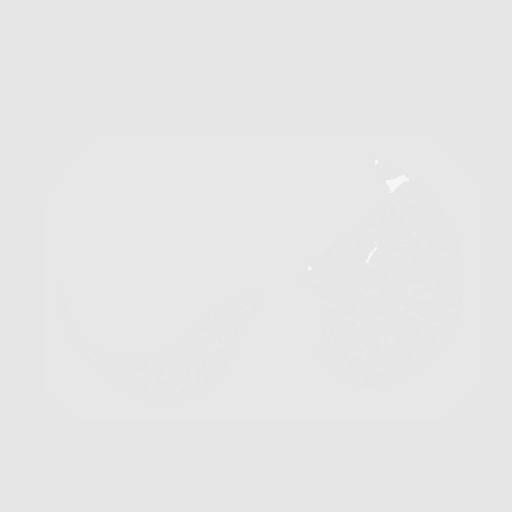
[frame 101/303  lung]
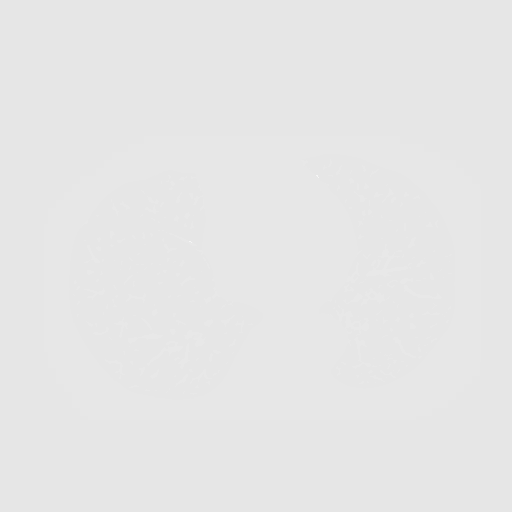
[frame 135/303  mediastinal]
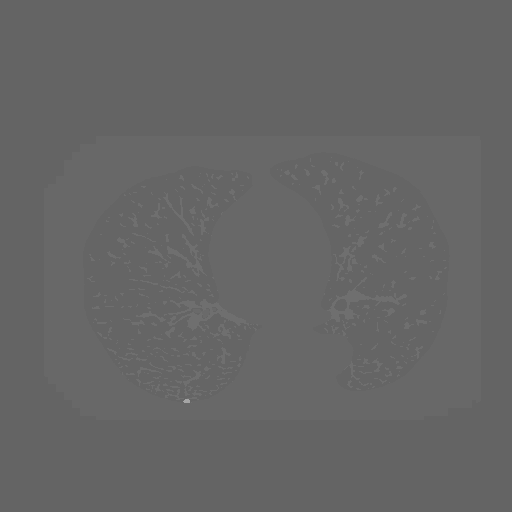
[frame 135/303  lung]
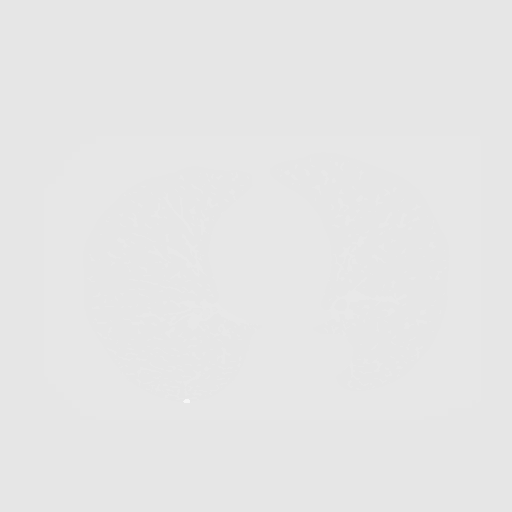
[frame 168/303  lung]
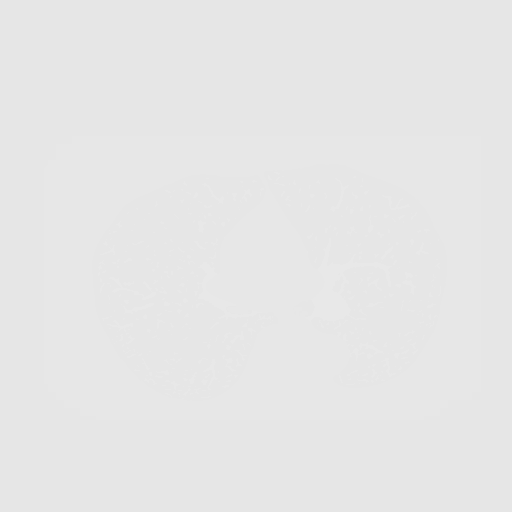
[frame 202/303  lung]
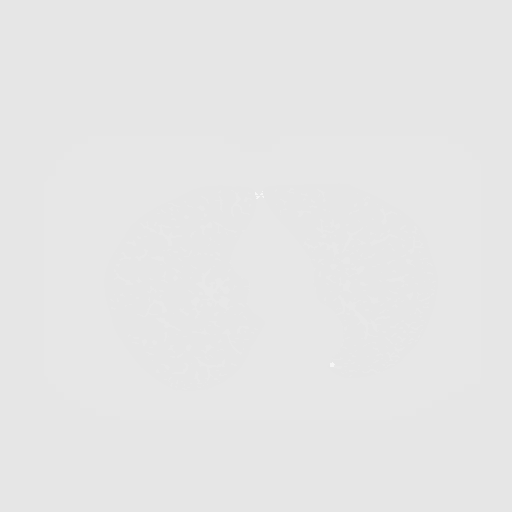
[frame 235/303  lung]
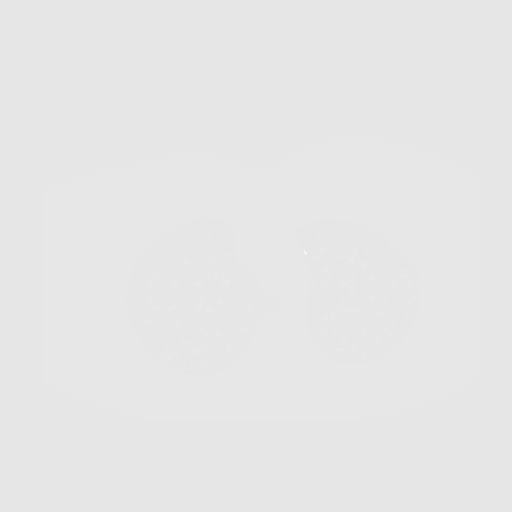
[frame 269/303  mediastinal]
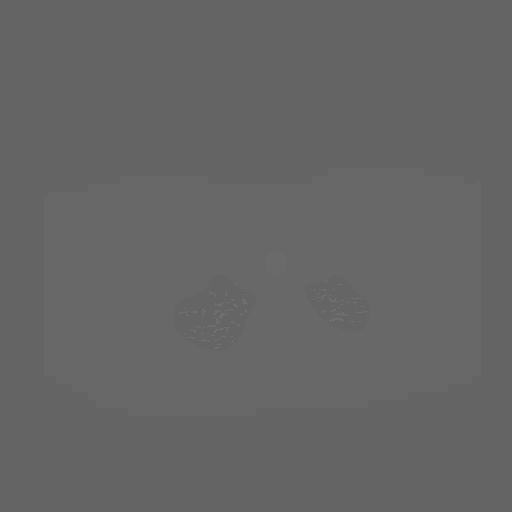
[frame 269/303  lung]
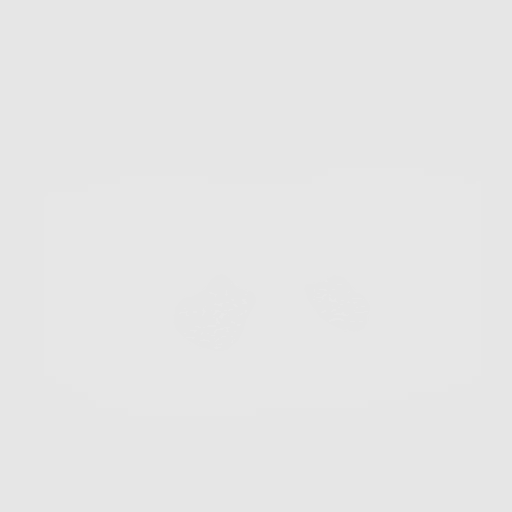
[frame 303/303  lung]
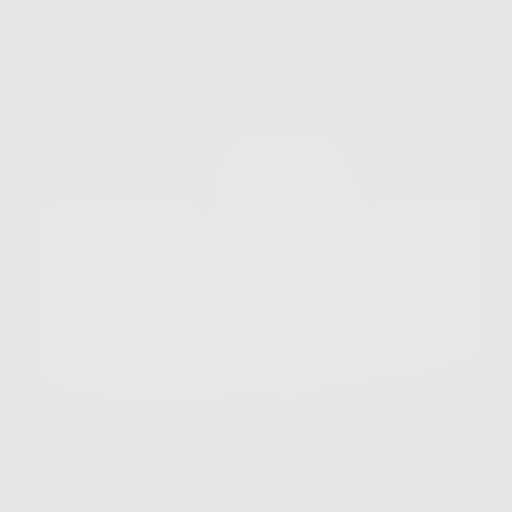

[10 of 10 positions shown; findings below may reference images not displayed]

FINDINGS: Cardiovascular: Normal heart size. No significant pericardial
effusion/thickening. Left anterior descending and right coronary
atherosclerosis. Atherosclerotic nonaneurysmal thoracic aorta.
Normal caliber pulmonary arteries.

Mediastinum/Nodes: No discrete thyroid nodules. Unremarkable
esophagus. No pathologically enlarged axillary, mediastinal or hilar
lymph nodes, noting limited sensitivity for the detection of hilar
adenopathy on this noncontrast study.

Lungs/Pleura: No pneumothorax. No pleural effusion. Mild
centrilobular emphysema. No acute consolidative airspace disease or
lung masses. Tiny solid peripheral right lower lobe pulmonary nodule
measuring 2.2 mm in volume derived mean diameter (series 3/image
176). No additional significant pulmonary nodules.

Upper abdomen: No acute abnormality.

Musculoskeletal: No aggressive appearing focal osseous lesions. Mild
thoracic spondylosis.
IMPRESSION: 1. Lung-RADS 2, benign appearance or behavior. Continue annual
screening with low-dose chest CT without contrast in 12 months.
2. Two-vessel coronary atherosclerosis.
3. Aortic Atherosclerosis (CY2IY-NIQ.Q) and Emphysema (CY2IY-180.2).

## 2023-01-01 ENCOUNTER — Ambulatory Visit (INDEPENDENT_AMBULATORY_CARE_PROVIDER_SITE_OTHER): Payer: Medicare PPO | Admitting: Internal Medicine

## 2023-01-01 ENCOUNTER — Other Ambulatory Visit: Payer: Self-pay | Admitting: Internal Medicine

## 2023-01-01 ENCOUNTER — Other Ambulatory Visit: Payer: Self-pay

## 2023-01-01 ENCOUNTER — Encounter: Payer: Self-pay | Admitting: Internal Medicine

## 2023-01-01 VITALS — BP 176/97 | HR 81 | Temp 98.1°F | Ht 60.0 in | Wt 152.6 lb

## 2023-01-01 DIAGNOSIS — F339 Major depressive disorder, recurrent, unspecified: Secondary | ICD-10-CM

## 2023-01-01 DIAGNOSIS — E559 Vitamin D deficiency, unspecified: Secondary | ICD-10-CM | POA: Diagnosis not present

## 2023-01-01 DIAGNOSIS — R011 Cardiac murmur, unspecified: Secondary | ICD-10-CM | POA: Diagnosis not present

## 2023-01-01 DIAGNOSIS — E7800 Pure hypercholesterolemia, unspecified: Secondary | ICD-10-CM

## 2023-01-01 DIAGNOSIS — Z87891 Personal history of nicotine dependence: Secondary | ICD-10-CM

## 2023-01-01 DIAGNOSIS — M8580 Other specified disorders of bone density and structure, unspecified site: Secondary | ICD-10-CM | POA: Diagnosis not present

## 2023-01-01 DIAGNOSIS — I351 Nonrheumatic aortic (valve) insufficiency: Secondary | ICD-10-CM | POA: Insufficient documentation

## 2023-01-01 DIAGNOSIS — I10 Essential (primary) hypertension: Secondary | ICD-10-CM

## 2023-01-01 MED ORDER — AMLODIPINE-OLMESARTAN 10-20 MG PO TABS: 1.0000 | ORAL_TABLET | Freq: Every day | ORAL | 0 refills | Status: DC

## 2023-01-01 NOTE — Assessment & Plan Note (Signed)
Osteopenia with T-score -1.7 2021. Repeat DEXA for osteopenia monitoring pending. I have provided the phone number for scheduling. Remains on calcium, vitamin D. Encouraged physical activity for muscle strengthening.

## 2023-01-01 NOTE — Assessment & Plan Note (Signed)
Follows with psychiatry, Dr. Casimiro Needle. PHQ-9 14 today, slightly increased from 12 at last visit. Continues to have significant family stressors. She does not feel her sertraline is working as well as previously. Dr. Casimiro Needle recommended switching from trazodone to hydroxyzine previously, however Mia Underwood reports she did not due so due to insurance coverage issues and continues to take trazodone qhs as needed.  Plan -Continue sertraline 100 mg daily, continue trazodone 100 qhs prn -F/u with Dr. Casimiro Needle, scheduled 01/30/23 -Continue counseling with behavioral health specialist -Discussed starting exercise to help with mental health

## 2023-01-01 NOTE — Progress Notes (Signed)
Established Patient Office Visit  Subjective   Patient ID: Mia Underwood, female    DOB: 1951-05-03  Age: 72 y.o. MRN: [id number]  Chief Complaint  Patient presents with   Meet new doctor    Mia Underwood returns to clinic today for follow-up of chronic medical conditions and first visit with me. Please see assessment/plan in problem-based charting for further details of today's visit.    Patient Active Problem List   Diagnosis Date Noted   Systolic murmur 68/10/7516   Postmenopausal estrogen deficiency 04/04/2022   Hand cramps 04/04/2022   Osteopenia 12/16/2019   Neuropathy of left lower extremity 09/03/2019   Bilateral leg cramps 09/09/2018   Allergic rhinitis 05/04/2016   Insomnia secondary to depression with anxiety 05/04/2016   Marijuana abuse 04/04/2016   Vitamin D deficiency 04/04/2016   Former smoker 01/17/2016   Healthcare maintenance 01/03/2016   Bilateral carpal tunnel syndrome 10/18/2010   Diverticulosis of sigmoid colon 06/07/2010   Hyperlipidemia 09/14/2009   Major depression, recurrent, chronic ([place]) 09/14/2009   Essential hypertension 09/14/2009      Objective:     BP (!) 176/97 (BP Location: Right Arm, Patient Position: Sitting, Cuff Size: Normal)   Pulse 81   Temp 98.1 F (36.7 C) (Oral)   Ht 5' (1.524 m)   Wt 152 lb 9.6 oz (69.2 kg)   SpO2 96% Comment: RA  BMI 29.80 kg/m  BP Readings from Last 3 Encounters:  01/01/23 (!) 176/97  11/22/22 (!) 140/72  11/07/22 (!) 187/72   Wt Readings from Last 3 Encounters:  01/01/23 152 lb 9.6 oz (69.2 kg)  11/22/22 148 lb 11.2 oz (67.4 kg)  11/07/22 151 lb 9.6 oz (68.8 kg)    Physical Exam Vitals reviewed.  Constitutional:      General: She is not in acute distress.    Appearance: Normal appearance. She is not ill-appearing.  Cardiovascular:     Rate and Rhythm: Normal rate and regular rhythm.     Heart sounds: Murmur (3/6 systolic murmur heard best at RUSB) heard.  Pulmonary:     Effort:  Pulmonary effort is normal.     Breath sounds: Normal breath sounds.  Musculoskeletal:        General: No swelling.  Neurological:     General: No focal deficit present.     Mental Status: She is alert.  Psychiatric:        Mood and Affect: Mood normal.        Behavior: Behavior normal.        Thought Content: Thought content normal.      Assessment & Plan:   Problem List Items Addressed This Visit       Cardiovascular and Mediastinum   Essential hypertension (Chronic)    Blood pressure elevated today at 169/90 on initial check. Recheck elevated to 176/97. Current regimen includes amlodipine 10 mg and hctz 25 mg daily. Given persistent elevation and borderline pressure at previous office visits, I have added olmesartan 20 mg daily. We discussed combination pill w/ amlodipine-olmesartan and stopping amlodipine only pill. Continue hydrochlorothiazide.  Plan -Start amlodipine-olmesartan 10-20 mg daily -Continue hydrochlorothiazide 25 mg daily -F/u in one month for BP recheck and BMP      Relevant Medications   amlodipine-olmesartan (AZOR) 10-20 MG tablet     Musculoskeletal and Integument   Osteopenia    Osteopenia with T-score -1.7 2021. Repeat DEXA for osteopenia monitoring pending. I have provided the phone number for scheduling. Remains on calcium, vitamin  D. Encouraged physical activity for muscle strengthening.        Other   Hyperlipidemia (Chronic)    Continues on rosuvastatin 20 mg daily for HLD. Last lipid profile appropriate for primary prevention 03/2022.      Relevant Medications   amlodipine-olmesartan (AZOR) 10-20 MG tablet   Major depression, recurrent, chronic ([facility]) - Primary (Chronic)    Follows with psychiatry, Dr. Casimiro Needle. PHQ-9 14 today, slightly increased from 12 at last visit. Continues to have significant family stressors. She does not feel her sertraline is working as well as previously. Dr. Casimiro Needle recommended switching from trazodone to hydroxyzine  previously, however Mia Underwood reports she did not due so due to insurance coverage issues and continues to take trazodone qhs as needed.  Plan -Continue sertraline 100 mg daily, continue trazodone 100 qhs prn -F/u with Dr. Casimiro Needle, scheduled 01/30/23 -Continue counseling with behavioral health specialist -Discussed starting exercise to help with mental health      Relevant Medications   traZODone (DESYREL) 50 MG tablet   Vitamin D deficiency (Chronic)    Vitamin D level low at last visit, now on 2000 IU supplementation. We will plan to recheck at next visit.      Former smoker    Former smoker, quit in 07/2022. She had multiple stretches of abstinence over the years, however total pack-years appear to be about 7. Congratulated on quitting and encouraged her to continue! She does not qualify for lung cancer screening per USPSTF guidelines.      Systolic murmur    3/6 systolic murmur heard best over right upper sternal border on today's exam. Mia Underwood reports no history of a murmur and I do not see evidence of documentation in the chart. She denies syncope. She does have occasional "pulling" sensation in her chest and dyspnea when she gets worked up and frustrated--sounds most consistent with anxiety. She denies any similar symptoms at other times. She has brief lightheadedness when rising quickly from a seated position but resolves quickly and denies other lightheadedness/dizziness. I suspect this is a benign murmur, however we will further clarify structural causes with TTE.  Plan -TTE      Relevant Orders   ECHOCARDIOGRAM COMPLETE    Return in about 4 weeks (around 01/29/2023) for f/u BP.    Charise Killian, MD

## 2023-01-01 NOTE — Assessment & Plan Note (Addendum)
Former smoker, quit in 07/2022. She had multiple stretches of abstinence over the years, however total pack-years appear to be about 7. Congratulated on quitting and encouraged her to continue! She does not qualify for lung cancer screening per USPSTF guidelines.

## 2023-01-01 NOTE — Assessment & Plan Note (Signed)
Vitamin D level low at last visit, now on 2000 IU supplementation. We will plan to recheck at next visit.

## 2023-01-01 NOTE — Assessment & Plan Note (Signed)
Continues on rosuvastatin 20 mg daily for HLD. Last lipid profile appropriate for primary prevention 03/2022.

## 2023-01-01 NOTE — Patient Instructions (Addendum)
It was wonderful to meet you today!  We discussed your plans to increase your physical activity with walking, this is a great idea! Try to walk at least 10 minutes every day of the week.  Congratulations on quitting smoking, keep it up!  For your high blood pressure, please continue taking all of your medications. We have added olmesartan 20 mg daily. Please take your medications every day.  I have ordered an ultrasound of your heart to look for causes of the heart murmur.  To schedule your bone density, please call [phone].

## 2023-01-01 NOTE — Assessment & Plan Note (Addendum)
Blood pressure elevated today at 169/90 on initial check. Recheck elevated to 176/97. Current regimen includes amlodipine 10 mg and hctz 25 mg daily. Given persistent elevation and borderline pressure at previous office visits, I have added olmesartan 20 mg daily. We discussed combination pill w/ amlodipine-olmesartan and stopping amlodipine only pill. Continue hydrochlorothiazide.  Plan -Start amlodipine-olmesartan 10-20 mg daily -Continue hydrochlorothiazide 25 mg daily -F/u in one month for BP recheck and BMP

## 2023-01-01 NOTE — Assessment & Plan Note (Signed)
3/6 systolic murmur heard best over right upper sternal border on today's exam. Mia Underwood reports no history of a murmur and I do not see evidence of documentation in the chart. She denies syncope. She does have occasional "pulling" sensation in her chest and dyspnea when she gets worked up and frustrated--sounds most consistent with anxiety. She denies any similar symptoms at other times. She has brief lightheadedness when rising quickly from a seated position but resolves quickly and denies other lightheadedness/dizziness. I suspect this is a benign murmur, however we will further clarify structural causes with TTE.  Plan -TTE

## 2023-01-03 ENCOUNTER — Ambulatory Visit (INDEPENDENT_AMBULATORY_CARE_PROVIDER_SITE_OTHER): Payer: Medicare PPO | Admitting: Psychology

## 2023-01-03 DIAGNOSIS — F324 Major depressive disorder, single episode, in partial remission: Secondary | ICD-10-CM | POA: Diagnosis not present

## 2023-01-03 NOTE — Progress Notes (Signed)
[facility] Counselor/Therapist Progress Note  Patient ID: Mia Underwood, MRN: [id number]   Date: 01/03/23  Time Spent: 11:04 am - 11:31 am : 27 Minutes  Treatment Type: Individual Therapy.  Reported Symptoms: depression, anxiety, and interpersonal stressors  Mental Status Exam: Appearance:  NA      Behavior: Appropriate  Motor: NA  Speech/Language:  Clear and Coherent  Affect: Appropriate  Mood: normal  Thought process: normal  Thought content:   WNL  Sensory/Perceptual disturbances:   WNL  Orientation: oriented to person, place, time/date, and situation  Attention: Good  Concentration: Good  Memory: WNL  Fund of knowledge:  Good  Insight:   Good  Judgment:  Good  Impulse Control: Good   Risk Assessment: Danger to Self:  No Self-injurious Behavior: No Danger to Others: No Duty to Warn:no Physical Aggression / Violence:No  Access to Firearms a concern: No  Gang Involvement:No   Subjective:   Mia Underwood participated from home, via phone and consented to treatment. Therapist participated from office. We met online due to [place] pandemic. Mia Underwood reviewed the events of the past week. She endorsed irritability and noted her blood pressure being high. She is seeing her primary care provider to address her BP. She noted low motivation. She noted being stressed due to factors outside her control. She provided feedback regarding varies experiences that cause significant stressors. We worked on identifying her coping skills and delineated what the efficacy of her coping skills. Therapist worked on identifying contributing factors to her mood.  We will continue exploring this going forward. Therapist provided supportive therapy.  A follow-up was scheduled for continued treatment.   Interventions: CBT  Diagnosis:  Major depressive disorder with single episode, in partial remission [facility])   Treatment Plan:  Client Abilities/Strengths Nyelle is forthcoming and  motivated for change.   Support System: Family and friends.   Client Treatment Preferences Outpatient therapy  Client Statement of Needs Marilou would like to manage mood, reduce frustration tolerance, manage irritability, process passed events, build and maintain boundaries, and manage current interpersonal stressors.    Treatment Level Weekly  Symptoms  Anxiety: feeling nervous, difficulty managing worry, worrying about different things, trouble relaxing, restlessness, irritability, feeling afraid something awful might happen   (Status: maintained)  Depression: loss of interest, feeling down, trouble sleeping, lethargy, overeating, feeling bad about self, difficulty concentrating, psychomotor agitation   (Status: maintained)  Goals:   Heela experiences symptoms of  depression and anxiety   Target Date: 10/10/23 Frequency: Weekly  Progress: 0 Modality: individual    Therapist will provide referrals for additional resources as appropriate.  Therapist will provide psycho-education regarding Devaeh's diagnosis and corresponding treatment approaches and interventions. Licensed Clinical Social Worker, [place], LCSW will support the patient's ability to achieve the goals identified. will employ CBT, BA, Problem-solving, Solution Focused, Mindfulness,  coping skills, & other evidenced-based practices will be used to promote progress towards healthy functioning to help manage decrease symptoms associated with his diagnosis.   Reduce overall level, frequency, and intensity of the feelings of depression, anxiety and panic evidenced by decreased overall symptoms from 6 to 7 days/week to 0 to 1 days/week per client report for at least 3 consecutive months. Verbally express understanding of the relationship between feelings of depression, anxiety and their impact on thinking patterns and behaviors. Verbalize an understanding of the role that distorted thinking plays in creating fears,  excessive worry, and ruminations.    Mia Underwood participated in the creation of the  treatment plan)    Mia Irish, LCSW

## 2023-01-08 ENCOUNTER — Telehealth: Payer: Self-pay

## 2023-01-08 ENCOUNTER — Other Ambulatory Visit: Payer: Self-pay | Admitting: Internal Medicine

## 2023-01-08 DIAGNOSIS — Z780 Asymptomatic menopausal state: Secondary | ICD-10-CM

## 2023-01-08 NOTE — Telephone Encounter (Signed)
Returned call to patient. No answer. Left message on VM requesting return call.  °

## 2023-01-08 NOTE — Telephone Encounter (Signed)
Requesting to speak with a nurse about medication, pt do not know the name of the med. Please call pt back.

## 2023-01-09 ENCOUNTER — Other Ambulatory Visit (HOSPITAL_COMMUNITY): Payer: Medicare PPO

## 2023-01-21 ENCOUNTER — Other Ambulatory Visit: Payer: Self-pay | Admitting: Internal Medicine

## 2023-01-21 DIAGNOSIS — I10 Essential (primary) hypertension: Secondary | ICD-10-CM

## 2023-01-24 ENCOUNTER — Ambulatory Visit: Payer: Medicare PPO | Admitting: Psychology

## 2023-01-29 ENCOUNTER — Other Ambulatory Visit: Payer: Self-pay | Admitting: Internal Medicine

## 2023-01-29 DIAGNOSIS — I10 Essential (primary) hypertension: Secondary | ICD-10-CM

## 2023-01-30 ENCOUNTER — Ambulatory Visit (HOSPITAL_BASED_OUTPATIENT_CLINIC_OR_DEPARTMENT_OTHER): Payer: Medicare PPO | Admitting: Psychiatry

## 2023-01-30 DIAGNOSIS — F3342 Major depressive disorder, recurrent, in full remission: Secondary | ICD-10-CM

## 2023-01-30 MED ORDER — SERTRALINE HCL 100 MG PO TABS: ORAL_TABLET | ORAL | 5 refills | Status: DC

## 2023-01-30 MED ORDER — TRAZODONE HCL 50 MG PO TABS: 100.0000 mg | ORAL_TABLET | Freq: Every evening | ORAL | 3 refills | Status: DC | PRN

## 2023-01-30 MED ORDER — HYDROXYZINE PAMOATE 25 MG PO CAPS: ORAL_CAPSULE | ORAL | 4 refills | Status: AC

## 2023-01-30 NOTE — Progress Notes (Signed)
Psychiatric Initial Adult Assessment   Patient Identification: Mia Underwood MRN:  [id number] Date of Evaluation:  01/30/2023 Referral Source: Eustaquio Maize McKinzie Chief Complaint: Clinical dg along well as best she can. Virtual Visit via Telephone Note       Today for the most part the patient is doing better.  She seems to have calmed down about unable.  Her neighbor was apparently cold and rude to her.  The patient very sensitive about it.  The patient seems to have gotten through it.  This is a neighbor who she sees every day.  The other issue was related to her grandchild who has 2 children who are her great grandson children.  Her grandchild wanted her to babysit for them.  The 2 young children are ages 70 and 2 and with obviously a lot of the patient handled.  Patient says the family is chaotic.  She says her granddaughter has an up-and-down relationship with the father of the children.  Patient protect herself and says that not to be involved in babysitting.  Further her granddaughter had a car accident with her car and the patient therefore lost her car.  She had actually another car that she is using.  But is very clear that her family seems to be overly dependent upon her and the patient seems to be very independent.  The patient likes to read she plays backgammon.  She gets on the computer.  She likes to do crosswords.  She does word search.  She says that when she takes the trazodone to sleep she wakes up in the morning with a headache.  If she does not take the trazodone she has no headache.  She is eating well and has good energy.  She drinks no alcohol and uses no drugs.  The patient is actually functioning very well.  She has a new therapist Mr. Ozmend.  She seems to think it will work out.  So she is back in therapy which is important.  She takes her Zoloft just as prescribed.   I connected with Mia Underwood on 01/30/23 at  4:00 PM EST by telephone and verified that I am speaking with the  correct person using two identifiers.  Location: Patient: home Provider: office   I discussed the limitations, risks, security and privacy concerns of performing an evaluation and management service by telephone and the availability of in person appointments. I also discussed with the patient that there may be a patient responsible charge related to this service. The patient expressed understanding and agreed to proceed.  ructions:    I discussed the assessment and treatment plan with the patient. The patient was provided an opportunity to ask questions and all were answered. The patient agreed with the plan and demonstrated an understanding of the instructions.   The patient was advised to call back or seek an in-person evaluation if the symptoms worsen or if the condition fails to improve as anticipated.  I provided 15 minutes of non-face-to-face time during this encounter.   Jerral Ralph, MD  Past Psychiatric History:   Previous Psychotropic Medications: Yes   Substance Abuse History in the last 12 months:  Yes.    Consequences of Substance Abuse:   Past Medical History:  Past Medical History:  Diagnosis Date   Allergic rhinitis 05/04/2016   Arthritis    Bilateral carpal tunnel syndrome 10/18/2010   Left > Right    Colles' fracture of left radius 07/01/2019   Diverticulosis  of sigmoid colon 06/07/2010   Essential hypertension 09/14/2009   Hyperlipidemia 09/14/2009   Insomnia secondary to depression with anxiety 05/04/2016   Major depression, recurrent, chronic ([place]) 09/14/2009   Marijuana abuse 04/04/2016   Muscle spasm of left shoulder 02/03/2019   Tobacco abuse 01/17/2016   Upper respiratory infection, viral 02/03/2019   Vitamin D deficiency 04/04/2016    Past Surgical History:  Procedure Laterality Date   ABDOMINAL HYSTERECTOMY  April 1996   For fibroids   COLONOSCOPY  07/18/2010   Patterson-tic's    Family Psychiatric History:   Family History:  Family History   Problem Relation Age of Onset   Breast cancer Mother    Depression Mother    Hypertension Mother    Dementia Mother    Alcoholism Father        Died in his 57's related to alcohol abuse   Leukemia Sister 34   Early death Brother        Died in a Teacher, music accident   Heart attack Daughter 64       Per patient report, specifics unknown   Diabetes Mellitus II Maternal Grandmother    Diabetes Mellitus II Paternal Grandmother    Healthy Sister    Healthy Sister    Arthritis Brother    Colon polyps Brother    Healthy Brother    Healthy Brother    Healthy Son    Breast cancer Maternal Aunt    Breast cancer Cousin    Breast cancer Maternal Aunt    Colon cancer Neg Hx    Esophageal cancer Neg Hx    Rectal cancer Neg Hx    Stomach cancer Neg Hx     Social History:   Social History   Socioeconomic History   Marital status: Single    Spouse name: Not on file   Number of children: 2   Years of education: 12 grade   Highest education level: Not on file  Occupational History   Occupation: Leisure centre manager: [employer]    Comment: Retired  Tobacco Use   Smoking status: Former    Packs/day: 0.20    Types: Cigarettes    Start date: 12/02/1973   Smokeless tobacco: Never   Tobacco comments:    Stopped in 1980 restarted in 1985. 1pack every 8 days.  Stopped on 01/20/2016. Restarted again. Now 1 pack per week  Vaping Use   Vaping Use: Never used  Substance and Sexual Activity   Alcohol use: Yes    Alcohol/week: 2.0 standard drinks of alcohol    Types: 1 Glasses of wine, 1 Cans of beer per week    Comment: drinks socially with friends   Drug use: No   Sexual activity: Not Currently  Other Topics Concern   Not on file  Social History Narrative   Graduated from [employer] in [place].  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      Current Social History 03/01/2021   Patient lives with granddaughter in an apartment on the first floor. There are not  steps up to the entrance the patient uses.       Patient's method of transportation is personal car.      The highest level of education was some college.      The patient currently retired.      Identified important Relationships are "My brother, my son"       Pets : None, but  I would like to have one       Interests / Fun: Love to read, play solitaire, small get-togethers with sisters       Current Stressors: My community is getting violent, strained family relationships      Religious / Personal Beliefs: Baptized [employer]       Social Determinants of Health   Financial Resource Strain: Not on file  Food Insecurity: Not on file  Transportation Needs: Not on file  Physical Activity: Not on file  Stress: Not on file  Social Connections: Not on file    Additional Social History:   Allergies:   Allergies  Allergen Reactions   Penicillins Hives   Naproxen Rash    Pt states she takes Aleve without problems    Metabolic Disorder Labs: No results found for: "HGBA1C", "MPG" No results found for: "PROLACTIN" Lab Results  Component Value Date   CHOL 167 04/04/2022   TRIG 76 04/04/2022   HDL 57 04/04/2022   CHOLHDL 2.9 04/04/2022   VLDL 20 11/01/2010   LDLCALC 96 04/04/2022   LDLCALC 70 06/20/2021     Current Medications: Current Outpatient Medications  Medication Sig Dispense Refill   hydrOXYzine (VISTARIL) 25 MG capsule 1  qday  prn 30 capsule 4   amlodipine-olmesartan (AZOR) 10-20 MG tablet TAKE 1 TABLET BY MOUTH DAILY 90 tablet 3   calcium carbonate (OS-CAL) 600 MG TABS tablet Take 1 tablet (600 mg total) by mouth 2 (two) times daily with a meal. 180 tablet 3   cetirizine (ZYRTEC) 10 MG tablet Take 1 tablet (10 mg total) by mouth daily. 90 tablet 3   Cholecalciferol (VITAMIN D) 50 MCG (2000 UT) tablet Take 1 tablet (2,000 Units total) by mouth daily. 90 tablet 3   cyanocobalamin (VITAMIN B12) 1000 MCG tablet Take 1 tablet (1,000 mcg total) by mouth  daily. 90 tablet 3   diclofenac Sodium (VOLTAREN) 1 % GEL Apply 4 g topically 4 (four) times daily. 150 g 3   gabapentin (NEURONTIN) 600 MG tablet Take 1 tablet (600 mg total) by mouth daily. 90 tablet 3   hydrochlorothiazide (HYDRODIURIL) 25 MG tablet Take 1 tablet (25 mg total) by mouth daily. 90 tablet 0   rosuvastatin (CRESTOR) 20 MG tablet Take 1 tablet (20 mg total) by mouth daily. 90 tablet 3   sertraline (ZOLOFT) 100 MG tablet 1  qam 60 tablet 5   traZODone (DESYREL) 50 MG tablet Take 2 tablets (100 mg total) by mouth at bedtime as needed for sleep. 30 tablet 3   No current facility-administered medications for this visit.    Neurologic: Headache: No Seizure: No Paresthesias:No  Musculoskeletal: Strength & Muscle Tone: within normal limits Gait & Station: normal Patient leans: N/A  Psychiatric Specialty Exam: ROS  There were no vitals taken for this visit.There is no height or weight on file to calculate BMI.  General Appearance: Casual  Eye Contact:  Fair  Speech:  Clear and Coherent  Volume:  Normal  Mood:  Depressed  Affect:  Blunt  Thought Process:  Goal Directed  Orientation:  Full (Time, Place, and Person)  Thought Content:  WDL  Suicidal Thoughts:  No  Homicidal Thoughts:  No  Memory:  NA  Judgement:  Good  Insight:  Good  Psychomotor Activity:  Normal  Concentration:    Recall:  [place] of Knowledge:Good  Language: Poor  Akathisia:  No  Handed:  Right  AIMS (if indicated):    Assets:  Desire for Improvement  ADL's:  Intact  Cognition: WNL  Sleep:      Treatment Plan Summary: 3/5/20244:27 PM    Today the patient is doing fairly well.  She is more resilient.  She will continue taking Zoloft 100 mg.  Today we will discontinue her trazodone and begin her on Vistaril 25 mg nightly as needed.  She will continue in one-to-one therapy and she will return to see me in 4 months.  I think the patient is doing better and functioning better.  He is able  to set up boundaries from her family and her toxic neighbors.

## 2023-01-30 NOTE — Progress Notes (Signed)
Psychiatric Initial Adult Assessment   Patient Identification: Mia Underwood MRN:  [id number] Date of Evaluation:  01/30/2023 Referral Source: Eustaquio Maize McKinzie Chief Complaint: Clinical dg along well as best she can. Virtual Visit via Telephone Note    Today the patient is actually doing fairly well.  She continues in one-to-one therapy.  She has chronic issues going on with her neighbor who irritates her.  It is noted that the patient's daughter died of a myocardial infarction in 2017.  It is noted that she has other issues mainly her grandson has been incarcerated for 15 years will be the next few weeks.  I am concerned that he will take advantage of this patient.  The patient is very given.  She has a granddaughter who she does a lot of things for her.  The patient denies being depressed persistently.  She is sleeping fairly well.  She does take some trazodone.  She has periods where she gets really irritated and anxious.  Today we will talk to her about using Vistaril on a as needed basis.  The patient does not drink or use any drugs.  Her hypertension is well controlled.  She has good medical care.   I connected with Mia Underwood on 01/30/23 at  4:00 PM EST by telephone and verified that I am speaking with the correct person using two identifiers.  Location: Patient: home Provider: office   I discussed the limitations, risks, security and privacy concerns of performing an evaluation and management service by telephone and the availability of in person appointments. I also discussed with the patient that there may be a patient responsible charge related to this service. The patient expressed understanding and agreed to proceed.  ructions:    I discussed the assessment and treatment plan with the patient. The patient was provided an opportunity to ask questions and all were answered. The patient agreed with the plan and demonstrated an understanding of the instructions.   The patient was  advised to call back or seek an in-person evaluation if the symptoms worsen or if the condition fails to improve as anticipated.  I provided 15 minutes of non-face-to-face time during this encounter.   Jerral Ralph, MD  Past Psychiatric History:   Previous Psychotropic Medications: Yes   Substance Abuse History in the last 12 months:  Yes.    Consequences of Substance Abuse:   Past Medical History:  Past Medical History:  Diagnosis Date   Allergic rhinitis 05/04/2016   Arthritis    Bilateral carpal tunnel syndrome 10/18/2010   Left > Right    Colles' fracture of left radius 07/01/2019   Diverticulosis of sigmoid colon 06/07/2010   Essential hypertension 09/14/2009   Hyperlipidemia 09/14/2009   Insomnia secondary to depression with anxiety 05/04/2016   Major depression, recurrent, chronic ([place]) 09/14/2009   Marijuana abuse 04/04/2016   Muscle spasm of left shoulder 02/03/2019   Tobacco abuse 01/17/2016   Upper respiratory infection, viral 02/03/2019   Vitamin D deficiency 04/04/2016    Past Surgical History:  Procedure Laterality Date   ABDOMINAL HYSTERECTOMY  April 1996   For fibroids   COLONOSCOPY  07/18/2010   Patterson-tic's    Family Psychiatric History:   Family History:  Family History  Problem Relation Age of Onset   Breast cancer Mother    Depression Mother    Hypertension Mother    Dementia Mother    Alcoholism Father        Died in his 86's  related to alcohol abuse   Leukemia Sister 8   Early death Brother        Died in a motor vehicle accident   Heart attack Daughter 21       Per patient report, specifics unknown   Diabetes Mellitus II Maternal Grandmother    Diabetes Mellitus II Paternal Grandmother    Healthy Sister    Healthy Sister    Arthritis Brother    Colon polyps Brother    Healthy Brother    Healthy Brother    Healthy Son    Breast cancer Maternal Aunt    Breast cancer Cousin    Breast cancer Maternal Aunt    Colon cancer Neg Hx     Esophageal cancer Neg Hx    Rectal cancer Neg Hx    Stomach cancer Neg Hx     Social History:   Social History   Socioeconomic History   Marital status: Single    Spouse name: Not on file   Number of children: 2   Years of education: 12 grade   Highest education level: Not on file  Occupational History   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 1pack every 8 days.  Stopped on 01/20/2016. Restarted again. Now 1 pack per week  Vaping Use   Vaping Use: Never used  Substance and Sexual Activity   Alcohol use: Yes    Alcohol/week: 2.0 standard drinks of alcohol    Types: 1 Glasses of wine, 1 Cans of beer per week    Comment: drinks socially with friends   Drug use: No   Sexual activity: Not Currently  Other Topics Concern   Not on file  Social History Narrative   Graduated from [employer] in [place].  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      Current Social History 03/01/2021   Patient lives with granddaughter in an apartment on the first floor. There are not steps up to the entrance the patient uses.       Patient's method of transportation is personal car.      The highest level of education was some college.      The patient currently retired.      Identified important Relationships are "My brother, my son"       Pets : None, but I would like to have one       Interests / Fun: Love to read, play solitaire, Therapist, nutritional with sisters       Current Stressors: My community is getting violent, strained family relationships      Religious / Personal Beliefs: Baptized [employer]       Social Determinants of Radio broadcast assistant Strain: Not on file  Food Insecurity: Not on file  Transportation Needs: Not on  file  Physical Activity: Not on file  Stress: Not on file  Social Connections: Not on file    Additional Social History:   Allergies:   Allergies  Allergen Reactions   Penicillins Hives   Naproxen Rash    Pt states she takes Aleve without problems    Metabolic Disorder Labs: No results found for: "HGBA1C", "MPG"  No results found for: "PROLACTIN" Lab Results  Component Value Date   CHOL 167 04/04/2022   TRIG 76 04/04/2022   HDL 57 04/04/2022   CHOLHDL 2.9 04/04/2022   VLDL 20 11/01/2010   LDLCALC 96 04/04/2022   LDLCALC 70 06/20/2021     Current Medications: Current Outpatient Medications  Medication Sig Dispense Refill   hydrOXYzine (VISTARIL) 25 MG capsule 1  qday  prn 30 capsule 4   amlodipine-olmesartan (AZOR) 10-20 MG tablet TAKE 1 TABLET BY MOUTH DAILY 90 tablet 3   calcium carbonate (OS-CAL) 600 MG TABS tablet Take 1 tablet (600 mg total) by mouth 2 (two) times daily with a meal. 180 tablet 3   cetirizine (ZYRTEC) 10 MG tablet Take 1 tablet (10 mg total) by mouth daily. 90 tablet 3   Cholecalciferol (VITAMIN D) 50 MCG (2000 UT) tablet Take 1 tablet (2,000 Units total) by mouth daily. 90 tablet 3   cyanocobalamin (VITAMIN B12) 1000 MCG tablet Take 1 tablet (1,000 mcg total) by mouth daily. 90 tablet 3   diclofenac Sodium (VOLTAREN) 1 % GEL Apply 4 g topically 4 (four) times daily. 150 g 3   gabapentin (NEURONTIN) 600 MG tablet Take 1 tablet (600 mg total) by mouth daily. 90 tablet 3   hydrochlorothiazide (HYDRODIURIL) 25 MG tablet Take 1 tablet (25 mg total) by mouth daily. 90 tablet 0   rosuvastatin (CRESTOR) 20 MG tablet Take 1 tablet (20 mg total) by mouth daily. 90 tablet 3   sertraline (ZOLOFT) 100 MG tablet 1  qam 60 tablet 5   traZODone (DESYREL) 50 MG tablet Take 2 tablets (100 mg total) by mouth at bedtime as needed for sleep. 30 tablet 3   No current facility-administered medications for this visit.    Neurologic: Headache: No Seizure:  No Paresthesias:No  Musculoskeletal: Strength & Muscle Tone: within normal limits Gait & Station: normal Patient leans: N/A  Psychiatric Specialty Exam: ROS  There were no vitals taken for this visit.There is no height or weight on file to calculate BMI.  General Appearance: Casual  Eye Contact:  Fair  Speech:  Clear and Coherent  Volume:  Normal  Mood:  Depressed  Affect:  Blunt  Thought Process:  Goal Directed  Orientation:  Full (Time, Place, and Person)  Thought Content:  WDL  Suicidal Thoughts:  No  Homicidal Thoughts:  No  Memory:  NA  Judgement:  Good  Insight:  Good  Psychomotor Activity:  Normal  Concentration:    Recall:  [place] of Knowledge:Good  Language: Poor  Akathisia:  No  Handed:  Right  AIMS (if indicated):    Assets:  Desire for Improvement  ADL's:  Intact  Cognition: WNL  Sleep:      Treatment Plan Summary: 3/5/20244:28 PM   This patient's diagnosis is major depression.  She is being treated with 100 mg of Zoloft.  She also has an adjustment disorder with an anxious mood state and will start taking Vistaril 25 mg on a as needed basis.  Her third problem is insomnia she takes trazodone for this condition.  Overall she is at her baseline.  She will return to see me in 4 months.

## 2023-02-08 ENCOUNTER — Encounter: Payer: Self-pay | Admitting: Internal Medicine

## 2023-02-12 ENCOUNTER — Other Ambulatory Visit: Payer: Self-pay

## 2023-02-12 ENCOUNTER — Ambulatory Visit (INDEPENDENT_AMBULATORY_CARE_PROVIDER_SITE_OTHER): Payer: Medicare PPO

## 2023-02-12 ENCOUNTER — Encounter: Payer: Self-pay | Admitting: Internal Medicine

## 2023-02-12 ENCOUNTER — Ambulatory Visit (INDEPENDENT_AMBULATORY_CARE_PROVIDER_SITE_OTHER): Payer: Medicare PPO | Admitting: Internal Medicine

## 2023-02-12 VITALS — BP 147/70 | HR 73 | Temp 99.5°F | Ht 60.0 in | Wt 154.2 lb

## 2023-02-12 VITALS — BP 147/70 | HR 73 | Temp 99.5°F | Resp 73 | Ht 60.0 in | Wt 154.2 lb

## 2023-02-12 DIAGNOSIS — Z0000 Encounter for general adult medical examination without abnormal findings: Secondary | ICD-10-CM

## 2023-02-12 DIAGNOSIS — F339 Major depressive disorder, recurrent, unspecified: Secondary | ICD-10-CM

## 2023-02-12 DIAGNOSIS — I10 Essential (primary) hypertension: Secondary | ICD-10-CM | POA: Diagnosis not present

## 2023-02-12 DIAGNOSIS — E559 Vitamin D deficiency, unspecified: Secondary | ICD-10-CM

## 2023-02-12 DIAGNOSIS — M8580 Other specified disorders of bone density and structure, unspecified site: Secondary | ICD-10-CM | POA: Diagnosis not present

## 2023-02-12 DIAGNOSIS — Z1231 Encounter for screening mammogram for malignant neoplasm of breast: Secondary | ICD-10-CM

## 2023-02-12 MED ORDER — AMLODIPINE-OLMESARTAN 10-40 MG PO TABS: 1.0000 | ORAL_TABLET | Freq: Every day | ORAL | 3 refills | Status: DC

## 2023-02-12 NOTE — Assessment & Plan Note (Signed)
PHQ-9 improved to 9 today. Follows with Dr. Casimiro Needle, psychiatry. [name] continues to have family and neighbor stressors but seems to be coping well. She is still working on finding a good exercise routine for overall and mental health benefit.  Plan -Continue f/u with Dr. Casimiro Needle

## 2023-02-12 NOTE — Assessment & Plan Note (Addendum)
Repeat DEXA pending. On vitamin D daily. Check vitamin D levels today.

## 2023-02-12 NOTE — Assessment & Plan Note (Addendum)
Blood pressure improved with addition of olmesartan 20 mg daily at last visit. Still remains above goal at 147/70. We discussed increase to olmesartan 40 mg daily. No AE reported from [facility].  Plan -Stop amlodipine-olmesartan 10-20 mg, start amlodipine-olmesartan 10-40 mg -Continue hctz 25 mg daily -Return in one month for BP recheck and BMP

## 2023-02-12 NOTE — Progress Notes (Signed)
Subjective:   ACHEL APLEY is a 72 y.o. female who presents for Medicare Annual (Subsequent) preventive examination. I connected with  MAKYNLEIGH HOUX on 02/12/23 by a  Face-To-Face encounter  and verified that I am speaking with the correct person using two identifiers.  Patient Location: Other:  Office/Clinic  Provider Location: Office/Clinic  I discussed the limitations of evaluation and management by telemedicine. The patient expressed understanding and agreed to proceed.  Review of Systems    Defer to PCP       Objective:    Today's Vitals   02/12/23 1344 02/12/23 1349  BP: (!) 142/68 (!) 147/70  Pulse: 78 73  Resp:  (!) 73  Temp: 99.5 F (37.5 C)   TempSrc: Oral   SpO2: 97%   Weight: 154 lb 3.2 oz (69.9 kg)   Height: 5' (1.524 m)    Body mass index is 30.12 kg/m.     02/12/2023    1:50 PM 02/12/2023   10:27 AM 01/01/2023    9:11 AM 11/22/2022    9:24 AM 11/07/2022   10:34 AM 08/08/2022   11:28 AM 04/04/2022   10:58 AM  Advanced Directives  Does Patient Have a Medical Advance Directive? No No No No Yes Yes Yes  Type of Scientist, research (medical);Living will [place];Living will [place];Living will [place]  Does patient want to make changes to medical advance directive?  No - Patient declined Yes (MAU/Ambulatory/Procedural Areas - Information given) No - Patient declined No - Patient declined No - Patient declined No - Patient declined  Copy of [place] in Chart?    No - copy requested No - copy requested No - copy requested No - copy requested  Would patient like information on creating a medical advance directive? No - Patient declined No - Patient declined Yes (MAU/Ambulatory/Procedural Areas - Information given) No - Patient declined       Current Medications (verified) Outpatient Encounter Medications as of 02/12/2023  Medication Sig   amLODipine-olmesartan  (AZOR) 10-40 MG tablet Take 1 tablet by mouth daily.   calcium carbonate (OS-CAL) 600 MG TABS tablet Take 1 tablet (600 mg total) by mouth 2 (two) times daily with a meal.   cetirizine (ZYRTEC) 10 MG tablet Take 1 tablet (10 mg total) by mouth daily.   Cholecalciferol (VITAMIN D) 50 MCG (2000 UT) tablet Take 1 tablet (2,000 Units total) by mouth daily.   cyanocobalamin (VITAMIN B12) 1000 MCG tablet Take 1 tablet (1,000 mcg total) by mouth daily.   diclofenac Sodium (VOLTAREN) 1 % GEL Apply 4 g topically 4 (four) times daily.   gabapentin (NEURONTIN) 600 MG tablet Take 1 tablet (600 mg total) by mouth daily.   hydrochlorothiazide (HYDRODIURIL) 25 MG tablet Take 1 tablet (25 mg total) by mouth daily.   hydrOXYzine (VISTARIL) 25 MG capsule 1  qday  prn   rosuvastatin (CRESTOR) 20 MG tablet Take 1 tablet (20 mg total) by mouth daily.   sertraline (ZOLOFT) 100 MG tablet 1  qam   traZODone (DESYREL) 50 MG tablet Take 2 tablets (100 mg total) by mouth at bedtime as needed for sleep.   No facility-administered encounter medications on file as of 02/12/2023.    Allergies (verified) Penicillins and Naproxen   History: Past Medical History:  Diagnosis Date   Allergic rhinitis 05/04/2016   Arthritis    Bilateral carpal tunnel syndrome 10/18/2010  Left > Right    Colles' fracture of left radius 07/01/2019   Diverticulosis of sigmoid colon 06/07/2010   Essential hypertension 09/14/2009   Healthcare maintenance 01/03/2016   Hyperlipidemia 09/14/2009   Insomnia secondary to depression with anxiety 05/04/2016   Major depression, recurrent, chronic ([place]) 09/14/2009   Marijuana abuse 04/04/2016   Muscle spasm of left shoulder 02/03/2019   Tobacco abuse 01/17/2016   Upper respiratory infection, viral 02/03/2019   Vitamin D deficiency 04/04/2016   Past Surgical History:  Procedure Laterality Date   ABDOMINAL HYSTERECTOMY  April 1996   For fibroids   COLONOSCOPY  07/18/2010   Patterson-tic's    Family History  Problem Relation Age of Onset   Breast cancer Mother    Depression Mother    Hypertension Mother    Dementia Mother    Alcoholism Father        Died in his 76's related to alcohol abuse   Leukemia Sister 44   Early death Brother        Died in a Teacher, music accident   Heart attack Daughter 57       Per patient report, specifics unknown   Diabetes Mellitus II Maternal Grandmother    Diabetes Mellitus II Paternal Grandmother    Healthy Sister    Healthy Sister    Arthritis Brother    Colon polyps Brother    Healthy Brother    Healthy Brother    Healthy Son    Breast cancer Maternal Aunt    Breast cancer Cousin    Breast cancer Maternal Aunt    Colon cancer Neg Hx    Esophageal cancer Neg Hx    Rectal cancer Neg Hx    Stomach cancer Neg Hx    Social History   Socioeconomic History   Marital status: Single    Spouse name: Not on file   Number of children: 2   Years of education: 12 grade   Highest education level: Not on file  Occupational History   Occupation: Leisure centre manager: [employer]    Comment: Retired  Tobacco Use   Smoking status: Former    Packs/day: .2    Types: Cigarettes    Start date: 12/02/1973    Quit date: 07/2022    Years since quitting: 0.5   Smokeless tobacco: Never  Vaping Use   Vaping Use: Never used  Substance and Sexual Activity   Alcohol use: Yes    Alcohol/week: 2.0 standard drinks of alcohol    Types: 1 Glasses of wine, 1 Cans of beer per week    Comment: drinks socially with friends   Drug use: No   Sexual activity: Not Currently  Other Topics Concern   Not on file  Social History Narrative   Graduated from [employer] in [place].  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      Current Social History 03/01/2021   Patient lives with granddaughter in an apartment on the first floor. There are not steps up to the entrance the patient uses.       Patient's method of transportation is  personal car.      The highest level of education was some college.      The patient currently retired.      Identified important Relationships are "My brother, my son"       Pets : None, but I would like to have one  Interests / Fun: Love to read, play solitaire, small get-togethers with sisters       Current Stressors: My community is getting violent, strained family relationships      Religious / Personal Beliefs: Baptized [employer]       Social Determinants of Health   Financial Resource Strain: Medium Risk (02/12/2023)   Overall Financial Resource Strain (CARDIA)    Difficulty of Paying Living Expenses: Somewhat hard  Food Insecurity: Food Insecurity Present (02/12/2023)   Hunger Vital Sign    Worried About Running Out of Food in the Last Year: Sometimes true    Ran Out of Food in the Last Year: Sometimes true  Transportation Needs: No Transportation Needs (02/12/2023)   PRAPARE - Hydrologist (Medical): No    Lack of Transportation (Non-Medical): No  Physical Activity: Inactive (02/12/2023)   Exercise Vital Sign    Days of Exercise per Week: 0 days    Minutes of Exercise per Session: 0 min  Stress: Stress Concern Present (02/12/2023)   [place]    Feeling of Stress : Very much  Social Connections: Socially Isolated (02/12/2023)   Social Connection and Isolation Panel [NHANES]    Frequency of Communication with Friends and Family: Once a week    Frequency of Social Gatherings with Friends and Family: Never    Attends Religious Services: Never    Marine scientist or Organizations: No    Attends Music therapist: Never    Marital Status: Divorced    Tobacco Counseling Counseling given: Not Answered   Clinical Intake:  Pre-visit preparation completed: Yes  Pain : No/denies pain     Nutritional Risks: None Diabetes: No  How often do  you need to have someone help you when you read instructions, pamphlets, or other written materials from your doctor or pharmacy?: 1 - Never What is the last grade level you completed in school?: college 1 year  Diabetic?No  Interpreter Needed?: No  Information entered by :: Jeanpierre Thebeau,cma   Activities of Daily Living    02/12/2023    1:50 PM 02/12/2023   10:27 AM  In your present state of health, do you have any difficulty performing the following activities:  Hearing? 0 0  Vision? 0 0  Difficulty concentrating or making decisions? 0 0  Walking or climbing stairs? 0 0  Dressing or bathing? 0 0  Doing errands, shopping? 0 0    Patient Care Team: Charise Killian, MD as PCP - General (Internal Medicine) Norma Fredrickson, MD as Consulting Physician (Psychiatry) Jenkins Rouge, LCAS as Counselor (Licensed Clinical Social Worker)  Indicate any recent [place] you may have received from other than [facility] providers in the past year (date may be approximate).     Assessment:   This is a routine wellness examination for Calisa.  Hearing/Vision screen No results found.  Dietary issues and exercise activities discussed:     Goals Addressed   None   Depression Screen    02/12/2023    1:50 PM 02/12/2023   11:00 AM 01/01/2023   10:10 AM 11/07/2022    2:09 PM 08/08/2022   11:07 AM 04/04/2022    8:54 AM 12/27/2021   11:37 AM  PHQ 2/9 Scores  PHQ - 2 Score 2 2 4 3 4 4 6   PHQ- 9 Score 9 9 14 12 15 15 15     Fall Risk  02/12/2023    1:50 PM 02/12/2023   10:27 AM 01/01/2023    9:10 AM 11/22/2022    9:24 AM 11/07/2022   10:33 AM  Fall Risk   Falls in the past year? 0 0 0 0 0  Number falls in past yr: 0 0 0 0 0  Injury with Fall? 0 0 0 0 0  Risk for fall due to : No Fall Risks No Fall Risks No Fall Risks    Follow up Falls evaluation completed;Falls prevention discussed Falls evaluation completed;Falls prevention discussed Falls evaluation completed;Falls prevention  discussed Falls evaluation completed Falls evaluation completed    FALL RISK PREVENTION PERTAINING TO THE HOME:  Any stairs in or around the home?  Patient refusal:Did not answer question If so, are there any without handrails? Patient refusal:Did not answer question Home free of loose throw rugs in walkways, pet beds, electrical cords, etc? Patient refusal:Did not answer question Adequate lighting in your home to reduce risk of falls? Patient refusal:Did not answer question  ASSISTIVE DEVICES UTILIZED TO PREVENT FALLS:  Life alert? Patient refusal:Did not answer question Use of a cane, walker or w/c? Patient refusal:Did not answer question Grab bars in the bathroom? Patient refusal:Did not answer question Shower chair or bench in shower? Patient refusal:Did not answer question Elevated toilet seat or a handicapped toilet? Patient refusal:Did not answer question  TIMED UP AND GO:  Was the test performed? No .  Length of time to ambulate 10 feet: 0 sec.   Gait slow and steady without use of assistive device  Cognitive Function:        02/12/2023    1:50 PM  6CIT Screen  What Year? 0 points  What month? 0 points  What time? 0 points  Count back from 20 0 points  Months in reverse 0 points  Repeat phrase 0 points  Total Score 0 points    Immunizations Immunization History  Administered Date(s) Administered   Fluad Quad(high Dose 65+) 08/08/2022   Influenza, High Dose Seasonal PF 12/27/2021   Influenza,inj,Quad PF,6+ Mos 08/02/2017, 09/09/2018, 09/03/2019, 10/26/2020   PFIZER(Purple Top)SARS-COV-2 Vaccination 01/23/2020, 02/17/2020, 10/18/2020   Pneumococcal Conjugate-13 11/30/2017   Pneumococcal Polysaccharide-23 02/10/2019   Td 06/17/2010   Tdap 03/22/2021    TDAP status: Up to date  Flu Vaccine status: Up to date  Pneumococcal vaccine status: Up to date  Covid-19 vaccine status: Completed vaccines  Qualifies for Shingles Vaccine? No   Zostavax completed No    Shingrix Completed?: No.    Education has been provided regarding the importance of this vaccine. Patient has been advised to call insurance company to determine out of pocket expense if they have not yet received this vaccine. Advised may also receive vaccine at local pharmacy or Health Dept. Verbalized acceptance and understanding.  Screening Tests Health Maintenance  Topic Date Due   COVID-19 Vaccine (4 - 2023-24 season) 07/28/2022   Medicare Annual Wellness (AWV)  02/12/2024   MAMMOGRAM  06/12/2024   COLONOSCOPY (Pts 45-11yrs Insurance coverage will need to be confirmed)  01/19/2026   DTaP/Tdap/Td (3 - Td or Tdap) 03/23/2031   Pneumonia Vaccine 63+ Years old  Completed   INFLUENZA VACCINE  Completed   DEXA SCAN  Completed   Hepatitis C Screening  Completed   HPV VACCINES  Aged Out   Zoster Vaccines- Shingrix  Discontinued    Health Maintenance  Health Maintenance Due  Topic Date Due   COVID-19 Vaccine (4 - 2023-24 season) 07/28/2022  Colorectal cancer screening: Type of screening: Colonoscopy. Completed 01/19/2021. Repeat every 5 years  Mammogram status: Ordered 02/12/2023. Pt provided with contact info and advised to call to schedule appt.   Bone Density status: Ordered scheduled for 06/08/2023. Pt provided with contact info and advised to call to schedule appt.  Lung Cancer Screening: (Low Dose CT Chest recommended if Age 73-80 years, 30 pack-year currently smoking OR have quit w/in 15years.) does not qualify.   Lung Cancer Screening Referral: N/A  Additional Screening:  Hepatitis C Screening: does not qualify; Completed 01/03/2016  Vision Screening: Recommended annual ophthalmology exams for early detection of glaucoma and other disorders of the eye. Is the patient up to date with their annual eye exam?  Patient refusal:Did not answer question Who is the provider or what is the name of the office in which the patient attends annual eye exams? Patient refusal:Did  not answer question If pt is not established with a provider, would they like to be referred to a provider to establish care? Patient refusal:Did not answer question.   Dental Screening: Recommended annual dental exams for proper oral hygiene  Community Resource Referral / Chronic Care Management: CRR required this visit?  No   CCM required this visit?  No      Plan:     I have personally reviewed and noted the following in the patient's chart:   Medical and social history Use of alcohol, tobacco or illicit drugs  Current medications and supplements including opioid prescriptions. Patient is not currently taking opioid prescriptions. Functional ability and status Nutritional status Physical activity Advanced directives List of other physicians Hospitalizations, surgeries, and ER visits in previous 12 months Vitals Screenings to include cognitive, depression, and falls Referrals and appointments  In addition, I have reviewed and discussed with patient certain preventive protocols, quality metrics, and best practice recommendations. A written personalized care plan for preventive services as well as general preventive health recommendations were provided to patient.     Kerin Perna, [facility]   02/12/2023   Nurse Notes: Face-To-Face Visit  [name] , Thank you for taking time to come for your Medicare Wellness Visit. I appreciate your ongoing commitment to your health goals. Please review the following plan we discussed and let me know if I can assist you in the future.   These are the goals we discussed:  Goals       Blood Pressure < 140/90      improve relationships with family members (pt-stated)      Increase physical activity      Begin seated and standing exercises with exercise band to increase strength and balance 3 times a week for 20 minutes each time       Quit smoking / using tobacco        This is a list of the screening recommended for you and due dates:   Health Maintenance  Topic Date Due   COVID-19 Vaccine (4 - 2023-24 season) 07/28/2022   Medicare Annual Wellness Visit  02/12/2024   Mammogram  06/12/2024   Colon Cancer Screening  01/19/2026   DTaP/Tdap/Td vaccine (3 - Td or Tdap) 03/23/2031   Pneumonia Vaccine  Completed   Flu Shot  Completed   DEXA scan (bone density measurement)  Completed   Hepatitis C Screening: USPSTF Recommendation to screen - Ages 47-79 yo.  Completed   HPV Vaccine  Aged Out   Zoster (Shingles) Vaccine  Discontinued

## 2023-02-12 NOTE — Assessment & Plan Note (Signed)
Vitamin D low in 10/2022, on 2000IU daily. Recheck today. History of osteopenia, repeat DEXA pending.

## 2023-02-12 NOTE — Patient Instructions (Signed)
It was wonderful to see you today!  Please start taking amlodipine-olmesartan 10-40 mg one tablet daily for blood pressure. STOP taking your amlodipine-olmesartan 10-20 mg. Continue all other medications.  Look into the [employer] program through [employer]. Try to decrease your snacks, one step at a time!

## 2023-02-12 NOTE — Progress Notes (Signed)
Established Patient Office Visit  Subjective   Patient ID: Mia Underwood, female    DOB: 1951/07/20  Age: 72 y.o. MRN: [id number]  Chief Complaint  Patient presents with   Follow-up    Ms. Tsai returns to clinic today for BP follow-up. Please see assessment/plan in problem-based charting for further details of today's visit.     Patient Active Problem List   Diagnosis Date Noted   Systolic murmur 99991111   Hand cramps 04/04/2022   Osteopenia 12/16/2019   Neuropathy of left lower extremity 09/03/2019   Bilateral leg cramps 09/09/2018   Allergic rhinitis 05/04/2016   Insomnia secondary to depression with anxiety 05/04/2016   Marijuana abuse 04/04/2016   Vitamin D deficiency 04/04/2016   Former smoker 01/17/2016   Bilateral carpal tunnel syndrome 10/18/2010   Diverticulosis of sigmoid colon 06/07/2010   Hyperlipidemia 09/14/2009   Major depression, recurrent, chronic ([place]) 09/14/2009   Essential hypertension 09/14/2009     Objective:     BP (!) 147/70 (BP Location: Right Arm, Patient Position: Sitting, Cuff Size: Normal)   Pulse 73   Temp 99.5 F (37.5 C) (Oral)   Ht 5' (1.524 m)   Wt 154 lb 3.2 oz (69.9 kg)   SpO2 97% Comment: RA  BMI 30.12 kg/m  BP Readings from Last 3 Encounters:  02/12/23 (!) 147/70  02/12/23 (!) 147/70  01/01/23 (!) 176/97   Wt Readings from Last 3 Encounters:  02/12/23 154 lb 3.2 oz (69.9 kg)  02/12/23 154 lb 3.2 oz (69.9 kg)  01/01/23 152 lb 9.6 oz (69.2 kg)    Physical Exam Vitals reviewed.  Constitutional:      General: She is not in acute distress.    Appearance: Normal appearance.  Pulmonary:     Effort: Pulmonary effort is normal.  Neurological:     General: No focal deficit present.     Mental Status: She is alert.  Psychiatric:        Mood and Affect: Mood normal.        Behavior: Behavior normal.      Assessment & Plan:   Problem List Items Addressed This Visit       Cardiovascular and Mediastinum    Essential hypertension (Chronic)    Blood pressure improved with addition of olmesartan 20 mg daily at last visit. Still remains above goal at 147/70. We discussed increase to olmesartan 40 mg daily. No AE reported from [facility].  Plan -Stop amlodipine-olmesartan 10-20 mg, start amlodipine-olmesartan 10-40 mg -Continue hctz 25 mg daily -Return in one month for BP recheck and BMP       Relevant Medications   amLODipine-olmesartan (AZOR) 10-40 MG tablet   Other Relevant Orders   BMP8+Anion Gap     Musculoskeletal and Integument   Osteopenia    Repeat DEXA pending. On vitamin D daily. Check vitamin D levels today.      Relevant Orders   Vitamin D (25 hydroxy)     Other   Major depression, recurrent, chronic (HCC) (Chronic)    PHQ-9 improved to 9 today. Follows with Dr. Casimiro Needle, psychiatry. Ms. Checchi continues to have family and neighbor stressors but seems to be coping well. She is still working on finding a good exercise routine for overall and mental health benefit.  Plan -Continue f/u with Dr. Casimiro Needle      Vitamin D deficiency - Primary (Chronic)    Vitamin D low in 10/2022, on 2000IU daily. Recheck today. History of osteopenia, repeat DEXA pending.  Relevant Orders   Vitamin D (25 hydroxy)   Other Visit Diagnoses     Encounter for screening mammogram for malignant neoplasm of breast       Relevant Orders   MM 3D SCREENING MAMMOGRAM BILATERAL BREAST       Return in about 4 weeks (around 03/12/2023) for fu BP.    Charise Killian, MD

## 2023-02-13 ENCOUNTER — Ambulatory Visit (INDEPENDENT_AMBULATORY_CARE_PROVIDER_SITE_OTHER): Payer: Medicare PPO | Admitting: Psychology

## 2023-02-13 DIAGNOSIS — F3342 Major depressive disorder, recurrent, in full remission: Secondary | ICD-10-CM

## 2023-02-13 LAB — BMP8+ANION GAP
Anion Gap: 17 mmol/L (ref 10.0–18.0)
BUN/Creatinine Ratio: 24 (ref 12–28)
BUN: 19 mg/dL (ref 8–27)
CO2: 21 mmol/L (ref 20–29)
Calcium: 10 mg/dL (ref 8.7–10.3)
Chloride: 103 mmol/L (ref 96–106)
Creatinine, Ser: 0.8 mg/dL (ref 0.57–1.00)
Glucose: 95 mg/dL (ref 70–99)
Potassium: 4.6 mmol/L (ref 3.5–5.2)
Sodium: 141 mmol/L (ref 134–144)
eGFR: 79 mL/min/{1.73_m2} (ref >59)

## 2023-02-13 LAB — VITAMIN D 25 HYDROXY (VIT D DEFICIENCY, FRACTURES): Vit D, 25-Hydroxy: 39.1 ng/mL (ref 30.0–100.0)

## 2023-02-13 NOTE — Progress Notes (Signed)
Normal vitamin D levels, continue current supplementation at 2000IU daily.

## 2023-02-13 NOTE — Progress Notes (Signed)
Unable to reach Mia Underwood by telephone 3/19, voicemail left to return call to clinic for results.

## 2023-02-13 NOTE — Progress Notes (Signed)
Kidney function and electrolytes wnl.

## 2023-02-13 NOTE — Progress Notes (Unsigned)
[place] Counselor/Therapist Progress Note  Patient ID: Mia Underwood, MRN: [id number]   Date: 02/13/23  Time Spent: 3:02 pm - 3:50 pm : 48 Minutes  Treatment Type: Individual Therapy.  Reported Symptoms: depression, anxiety, and interpersonal stressors  Mental Status Exam: Appearance:  Casual     Behavior: Appropriate  Motor: Normal  Speech/Language:  Clear and Coherent  Affect: Appropriate  Mood: dysthymic  Thought process: normal  Thought content:   WNL  Sensory/Perceptual disturbances:   WNL  Orientation: oriented to person, place, time/date, and situation  Attention: Good  Concentration: Good  Memory: WNL  Fund of knowledge:  Good  Insight:   Good  Judgment:  Good  Impulse Control: Good   Risk Assessment: Danger to Self:  No Self-injurious Behavior: No Danger to Others: No Duty to Warn:no Physical Aggression / Violence:No  Access to Firearms a concern: No  Gang Involvement:No   Subjective:   Mia Underwood participated from office. With the therapist.  Mia Underwood reviewed the events of the past week. She endorsed anxiety and could not identify a cause. She noted often jumping from one task to another, feeling restless, and noted family members asking more and more of her. She noted a need to focus on health and eating more healthfully going forward. We explored this during the session. She noted familial stressors and noted a need for continued boundaries with her grandchildren. She noted her mother's passing Jan 21st, 2023.  She noted often being in the middle of familial arguments with her mother and grandmother and between her two grand-daughters. She noted a lack of equality with her family members and noted often being ask to do what her family would not provide to her. She noted these relationships often being one-way. We explored relationship dynamics, during the session, and her need for peace.    We will continue exploring this going forward.  Therapist provided supportive therapy.  A follow-up was scheduled for continued treatment.   Interventions: CBT  Diagnosis:  Recurrent major depressive disorder, in full remission [facility])   Treatment Plan:  Client Abilities/Strengths Mia Underwood is forthcoming and motivated for change.   Support System: Family and friends.   Client Treatment Preferences Outpatient therapy  Client Statement of Needs Mia Underwood would like to manage mood, reduce frustration tolerance, manage irritability, process passed events, build and maintain boundaries, and manage current interpersonal stressors.    Treatment Level Weekly  Symptoms  Anxiety: feeling nervous, difficulty managing worry, worrying about different things, trouble relaxing, restlessness, irritability, feeling afraid something awful might happen   (Status: maintained)  Depression: loss of interest, feeling down, trouble sleeping, lethargy, overeating, feeling bad about self, difficulty concentrating, psychomotor agitation   (Status: maintained)  Goals:   Mia Underwood experiences symptoms of  depression and anxiety   Target Date: 10/10/23 Frequency: Weekly  Progress: 0 Modality: individual    Therapist will provide referrals for additional resources as appropriate.  Therapist will provide psycho-education regarding [name]'s diagnosis and corresponding treatment approaches and interventions. Licensed Clinical Social Worker, [place], LCSW will support the patient's ability to achieve the goals identified. will employ CBT, BA, Problem-solving, Solution Focused, Mindfulness,  coping skills, & other evidenced-based practices will be used to promote progress towards healthy functioning to help manage decrease symptoms associated with his diagnosis.   Reduce overall level, frequency, and intensity of the feelings of depression, anxiety and panic evidenced by decreased overall symptoms from 6 to 7 days/week to 0 to 1 days/week per client report  for at  least 3 consecutive months. Verbally express understanding of the relationship between feelings of depression, anxiety and their impact on thinking patterns and behaviors. Verbalize an understanding of the role that distorted thinking plays in creating fears, excessive worry, and ruminations.    Mia Underwood participated in the creation of the treatment plan)    Mia Irish, LCSW

## 2023-02-15 ENCOUNTER — Ambulatory Visit: Payer: Medicare PPO | Admitting: Psychology

## 2023-02-27 ENCOUNTER — Ambulatory Visit (INDEPENDENT_AMBULATORY_CARE_PROVIDER_SITE_OTHER): Payer: Medicare PPO | Admitting: Psychology

## 2023-02-27 DIAGNOSIS — F324 Major depressive disorder, single episode, in partial remission: Secondary | ICD-10-CM

## 2023-02-27 DIAGNOSIS — F3342 Major depressive disorder, recurrent, in full remission: Secondary | ICD-10-CM | POA: Diagnosis not present

## 2023-02-27 NOTE — Progress Notes (Signed)
[place] Counselor/Therapist Progress Note  Patient ID: Mia Underwood, MRN: [id number]   Date: 02/27/23  Time Spent: 1:04 pm - 1:48 pm : 44 Minutes  Treatment Type: Individual Therapy.  Reported Symptoms: depression, anxiety, and interpersonal stressors  Mental Status Exam: Appearance:  Casual     Behavior: Appropriate  Motor: Normal  Speech/Language:  Clear and Coherent  Affect: Appropriate  Mood: dysthymic  Thought process: normal  Thought content:   WNL  Sensory/Perceptual disturbances:   WNL  Orientation: oriented to person, place, time/date, and situation  Attention: Good  Concentration: Good  Memory: WNL  Fund of knowledge:  Good  Insight:   Good  Judgment:  Good  Impulse Control: Good   Risk Assessment: Danger to Self:  No Self-injurious Behavior: No Danger to Others: No Duty to Warn:no Physical Aggression / Violence:No  Access to Firearms a concern: No  Gang Involvement:No   Subjective:   Mia Underwood participated from office. With the therapist.  Mia Underwood reviewed the events of the past week. [name] noted enjoying her easter but discussed that some of those who noted they would attend did not. She noted working on trying to stay positive during this time and noted that she knows many people who are sick or ill recently. She noted working on managing her anxiety regarding this situation. She noted feelings of depression. She noted her intent to join silver sne[employer] and learn how to swim. She noted her sadness is due to a lack of companionship and engagement in activities with others such as dancing, bowling, or having a picnic. She noted to barriers to this including her friends being bill, having friends who continue to work full-time, and people being "laid back". She noted a need to be motivated and discussed often being "lazy" in regards to her being more physical and noted often saying to herself "I will do it later" but noted that she does not  end engaging in said behavior. She noted hopefulness that going with her cousin will be a motivating factor. She noted her plan to go to the gym in mid-April. She noted additional barriers to working out including preparation (getting dressed), getting out of comfort zone (apartment). We employed BA principles to identify schedule, create reasonable goals (1 lap - 10 min in lieu of 30 minutes), increasing enjoyment of exercise (music), employ reinforcers (tv time or playing games, addressing barriers (discomfort with workout attire,, and employing positive self-talk. Therapist provided psycho-education regarding approach. [name] was engaged an dmotivated and noted here interest in following up on her session goals. Therapist provided supportive therapy.  A follow-up was scheduled for continued treatment.   Interventions: BA and CBT  Diagnosis:  Recurrent major depressive disorder, in full remission  Major depressive disorder with single episode, in partial remission   Treatment Plan:  Client Abilities/Strengths [name] is forthcoming and motivated for change.   Support System: Family and friends.   Client Treatment Preferences Outpatient therapy  Client Statement of Needs Rein would like to manage mood, reduce frustration tolerance, manage irritability, process passed events, build and maintain boundaries, and manage current interpersonal stressors.    Treatment Level Weekly  Symptoms  Anxiety: feeling nervous, difficulty managing worry, worrying about different things, trouble relaxing, restlessness, irritability, feeling afraid something awful might happen   (Status: maintained)  Depression: loss of interest, feeling down, trouble sleeping, lethargy, overeating, feeling bad about self, difficulty concentrating, psychomotor agitation   (Status: maintained)  Goals:   Mia Underwood experiences symptoms  of  depression and anxiety   Target Date: 10/10/23 Frequency: Weekly  Progress: 0  Modality: individual    Therapist will provide referrals for additional resources as appropriate.  Therapist will provide psycho-education regarding Mia Underwood's diagnosis and corresponding treatment approaches and interventions. Licensed Clinical Social Worker, [place], LCSW will support the patient's ability to achieve the goals identified. will employ CBT, BA, Problem-solving, Solution Focused, Mindfulness,  coping skills, & other evidenced-based practices will be used to promote progress towards healthy functioning to help manage decrease symptoms associated with his diagnosis.   Reduce overall level, frequency, and intensity of the feelings of depression, anxiety and panic evidenced by decreased overall symptoms from 6 to 7 days/week to 0 to 1 days/week per client report for at least 3 consecutive months. Verbally express understanding of the relationship between feelings of depression, anxiety and their impact on thinking patterns and behaviors. Verbalize an understanding of the role that distorted thinking plays in creating fears, excessive worry, and ruminations.    Mia Underwood participated in the creation of the treatment plan)    Mia Irish, LCSW

## 2023-03-05 ENCOUNTER — Ambulatory Visit (HOSPITAL_COMMUNITY): Admission: RE | Admit: 2023-03-05 | Payer: Medicare PPO | Source: Ambulatory Visit

## 2023-03-26 ENCOUNTER — Ambulatory Visit (INDEPENDENT_AMBULATORY_CARE_PROVIDER_SITE_OTHER): Payer: Medicare PPO | Admitting: Psychology

## 2023-03-26 DIAGNOSIS — F324 Major depressive disorder, single episode, in partial remission: Secondary | ICD-10-CM | POA: Diagnosis not present

## 2023-03-26 NOTE — Progress Notes (Signed)
[facility] Counselor/Therapist Progress Note  Patient ID: Mia Underwood, MRN: [id number]   Date: 03/26/23  Time Spent: 11:09 pm - 11:55 pm : 46 Minutes  Treatment Type: Individual Therapy.  Reported Symptoms: depression, anxiety, and interpersonal stressors  Mental Status Exam: Appearance:  Casual     Behavior: Appropriate  Motor: Normal  Speech/Language:  Clear and Coherent  Affect: Appropriate  Mood: dysthymic  Thought process: normal  Thought content:   WNL  Sensory/Perceptual disturbances:   WNL  Orientation: oriented to person, place, time/date, and situation  Attention: Good  Concentration: Good  Memory: WNL  Fund of knowledge:  Good  Insight:   Good  Judgment:  Good  Impulse Control: Good   Risk Assessment: Danger to Self:  No Self-injurious Behavior: No Danger to Others: No Duty to Warn:no Physical Aggression / Violence:No  Access to Firearms a concern: No  Gang Involvement:No   Subjective:   [name] participated from office with the therapist.  Joie reviewed the events of the past week. Kemi noted her effort to begin exercise but noted feeling lazy and "needing to lose weight". We worked on identifying barriers to consistent exercise. Barriers include a lack of interest in specific exercises, laziness, lack of motivation. We worked on [employer] including music or audiobooks, walking at locations that have nice scenery. Therapist encouraged problem-solving and the importance of engaging in self-care consistently. She highlighted interpersonal stressors and noted needing consistent boundaries with other including neighbors. We explored this stressors and ways to manage this stress going forwards. 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 Maalle was engaged and motivated and noted here interest in following up on her  session goals. Therapist provided supportive therapy.  A follow-up was scheduled for continued treatment.   Interventions: BA and CBT  Diagnosis:  Major depressive disorder with single episode, in partial remission [facility])   Treatment Plan:  Client Abilities/Strengths Jacklyne is forthcoming and motivated for change.   Support System: Family and friends.   Client Treatment Preferences Outpatient therapy  Client Statement of Needs Hosanna would like to manage mood, reduce frustration tolerance, manage irritability, process passed events, build and maintain boundaries, and manage current interpersonal stressors.    Treatment Level Weekly  Symptoms  Anxiety: feeling nervous, difficulty managing worry, worrying about different things, trouble relaxing, restlessness, irritability, feeling afraid something awful might happen   (Status: maintained)  Depression: loss of interest, feeling down, trouble sleeping, lethargy, overeating, feeling bad about self, difficulty concentrating, psychomotor agitation   (Status: maintained)  Goals:   Kalyn experiences symptoms of  depression and anxiety   Target Date: 10/10/23 Frequency: Weekly  Progress: 0 Modality: individual    Therapist will provide referrals for additional resources as appropriate.  Therapist will provide psycho-education regarding Lynn's diagnosis and corresponding treatment approaches and interventions. Licensed Clinical Social Worker, [place], LCSW will support the patient's ability to achieve the goals identified. will employ CBT, BA, Problem-solving, Solution Focused, Mindfulness,  coping skills, & other evidenced-based practices will be used to promote progress towards healthy functioning to help manage decrease symptoms associated with his diagnosis.   Reduce overall level, frequency, and intensity of the feelings of depression, anxiety and panic evidenced by decreased overall symptoms from 6 to 7 days/week to 0 to 1  days/week per client report for at least 3 consecutive months. Verbally express  understanding of the relationship between feelings of depression, anxiety and their impact on thinking patterns and behaviors. Verbalize an understanding of the role that distorted thinking plays in creating fears, excessive worry, and ruminations.    [name] in the creation of the treatment plan)    [name], LCSW

## 2023-04-02 ENCOUNTER — Ambulatory Visit (INDEPENDENT_AMBULATORY_CARE_PROVIDER_SITE_OTHER): Payer: Medicare PPO | Admitting: Internal Medicine

## 2023-04-02 ENCOUNTER — Ambulatory Visit (HOSPITAL_COMMUNITY)
Admission: RE | Admit: 2023-04-02 | Discharge: 2023-04-02 | Disposition: A | Discharge status: Home / Self Care | Payer: Medicare PPO | Source: Ambulatory Visit | Attending: Internal Medicine | Admitting: Internal Medicine

## 2023-04-02 ENCOUNTER — Other Ambulatory Visit (HOSPITAL_COMMUNITY): Payer: Self-pay | Admitting: Psychiatry

## 2023-04-02 ENCOUNTER — Other Ambulatory Visit: Payer: Self-pay

## 2023-04-02 ENCOUNTER — Encounter: Payer: Self-pay | Admitting: Internal Medicine

## 2023-04-02 VITALS — BP 134/75 | HR 78 | Temp 98.3°F | Ht 60.0 in | Wt 156.8 lb

## 2023-04-02 DIAGNOSIS — R011 Cardiac murmur, unspecified: Secondary | ICD-10-CM | POA: Insufficient documentation

## 2023-04-02 DIAGNOSIS — I700 Atherosclerosis of aorta: Secondary | ICD-10-CM | POA: Diagnosis not present

## 2023-04-02 DIAGNOSIS — R252 Cramp and spasm: Secondary | ICD-10-CM

## 2023-04-02 DIAGNOSIS — E785 Hyperlipidemia, unspecified: Secondary | ICD-10-CM | POA: Diagnosis not present

## 2023-04-02 DIAGNOSIS — F17200 Nicotine dependence, unspecified, uncomplicated: Secondary | ICD-10-CM | POA: Insufficient documentation

## 2023-04-02 DIAGNOSIS — F332 Major depressive disorder, recurrent severe without psychotic features: Secondary | ICD-10-CM

## 2023-04-02 DIAGNOSIS — I10 Essential (primary) hypertension: Secondary | ICD-10-CM | POA: Insufficient documentation

## 2023-04-02 DIAGNOSIS — I351 Nonrheumatic aortic (valve) insufficiency: Secondary | ICD-10-CM | POA: Diagnosis not present

## 2023-04-02 DIAGNOSIS — M8580 Other specified disorders of bone density and structure, unspecified site: Secondary | ICD-10-CM

## 2023-04-02 LAB — ECHOCARDIOGRAM COMPLETE
AV Vena cont: 0.2 cm
Area-P 1/2: 2.95 cm2
Calc EF: 58 %
Height: 60 in
P 1/2 time: 441 msec
S' Lateral: 2.6 cm
Single Plane A2C EF: 55 %
Single Plane A4C EF: 58.8 %
Weight: 2508.8 oz

## 2023-04-02 NOTE — Assessment & Plan Note (Signed)
Blood pressure improved. No AE reported. Continue current regimen.  Plan -Continue amlodipine-olmesartan 10-40 mg, hctz 25 mg daily

## 2023-04-02 NOTE — Assessment & Plan Note (Signed)
Continues to have intermittent, bilateral hand cramps. Recheck electrolytes today, add magnesium. Encourage hydration.

## 2023-04-02 NOTE — Assessment & Plan Note (Signed)
Noted on CT chest 04/2021. Discussed risk factor modification including lifestyle changes and continuing statin therapy.

## 2023-04-02 NOTE — Progress Notes (Addendum)
Established Patient Office Visit  Subjective   Patient ID: Mia Underwood, female    DOB: 1951/09/16  Age: 72 y.o. MRN: [id number]  Chief Complaint  Patient presents with   Follow-up    [name] returns to clinic today for follow-up of hypertension and other chronic conditions. Please see assessment/plan in problem-based charting for further details of today's visit.     Patient Active Problem List   Diagnosis Date Noted   Aortic atherosclerosis (HCC) 04/02/2023   Systolic murmur 01/01/2023   Hand cramps 04/04/2022   Osteopenia 12/16/2019   Neuropathy of left lower extremity 09/03/2019   Bilateral leg cramps 09/09/2018   Allergic rhinitis 05/04/2016   Insomnia secondary to depression with anxiety 05/04/2016   Marijuana abuse 04/04/2016   Vitamin D deficiency 04/04/2016   Former smoker 01/17/2016   Bilateral carpal tunnel syndrome 10/18/2010   Diverticulosis of sigmoid colon 06/07/2010   Hyperlipidemia 09/14/2009   Moderately severe recurrent major depression (HCC) 09/14/2009   Essential hypertension 09/14/2009     Objective:     BP 134/75 (BP Location: Right Arm, Patient Position: Sitting, Cuff Size: Normal)   Pulse 78   Temp 98.3 F (36.8 C) (Oral)   Ht 5' (1.524 m)   Wt 156 lb 12.8 oz (71.1 kg)   SpO2 98% Comment: RA  BMI 30.62 kg/m  BP Readings from Last 3 Encounters:  04/02/23 134/75  02/12/23 (!) 147/70  02/12/23 (!) 147/70   Wt Readings from Last 3 Encounters:  04/02/23 156 lb 12.8 oz (71.1 kg)  02/12/23 154 lb 3.2 oz (69.9 kg)  02/12/23 154 lb 3.2 oz (69.9 kg)     Physical Exam Vitals reviewed.  Constitutional:      General: She is not in acute distress.    Appearance: Normal appearance.  Cardiovascular:     Rate and Rhythm: Normal rate and regular rhythm.     Heart sounds: Murmur (3/6, systolic) heard.  Neurological:     General: No focal deficit present.     Mental Status: She is alert.  Psychiatric:        Mood and Affect: Mood  normal.        Behavior: Behavior normal.       Assessment & Plan:   Problem List Items Addressed This Visit       Cardiovascular and Mediastinum   Essential hypertension - Primary (Chronic)    Blood pressure improved. No AE reported. Continue current regimen.  Plan -Continue amlodipine-olmesartan 10-40 mg, hctz 25 mg daily      Relevant Orders   BMP8+Anion Gap   Aortic atherosclerosis (HCC)    Noted on CT chest 04/2021. Discussed risk factor modification including lifestyle changes and continuing statin therapy.        Musculoskeletal and Integument   Osteopenia    Remains on vitamin D 2000 IU daily. Discussed calcium rich food intake and exercise for strengthening and fall prevention. F/u DEXA scheduled 05/2023.        Other   Moderately severe recurrent major depression (HCC)    Chronic and stable. PHQ9 of 11. Working on self-coping mechanisms and exercise. Follows with psychiatry and behavioral health counselor.  Plan -Continue current management per Dr. Donell Beers      Hand cramps    Continues to have intermittent, bilateral hand cramps. Recheck electrolytes today, add magnesium. Encourage hydration.      Relevant Orders   Magnesium   Systolic murmur    Systolic, 3/6, murmur heard again  today. Denies chest pain. Dyspnea when she is anxious and upset, otherwise denies. TTE scheduled for later today.        Return in about 3 months (around 07/03/2023).    Dickie La, MD

## 2023-04-02 NOTE — Patient Instructions (Signed)
It was wonderful to see you today!  Your blood pressure looked better today, please keep taking your medications as prescribed. Please check your blood pressure at home several days each week. If you notice it is consistently >140/90, please call the clinic.  Increase your walking to at least several times each week. The goal would be at least 150 minutes of exercise each week! Try to cut out or cut down on the junk food at home. Increase the foods containing calcium that you eat, including low-fat dairy and leafy greens.

## 2023-04-02 NOTE — Progress Notes (Signed)
Echocardiogram 2D Echocardiogram has been performed.  Augustine Radar 04/02/2023, 11:05 AM

## 2023-04-02 NOTE — Assessment & Plan Note (Addendum)
Remains on vitamin D 2000 IU daily. Discussed calcium rich food intake and exercise for strengthening and fall prevention. F/u DEXA scheduled 05/2023.

## 2023-04-02 NOTE — Assessment & Plan Note (Signed)
Systolic, 3/6, murmur heard again today. Denies chest pain. Dyspnea when she is anxious and upset, otherwise denies. TTE scheduled for later today.

## 2023-04-02 NOTE — Assessment & Plan Note (Signed)
Chronic and stable. PHQ9 of 11. Working on self-coping mechanisms and exercise. Follows with psychiatry and behavioral health counselor.  Plan -Continue current management per Dr. Donell Beers

## 2023-04-04 LAB — BMP8+ANION GAP
Anion Gap: 17 mmol/L (ref 10.0–18.0)
BUN/Creatinine Ratio: 23 (ref 12–28)
BUN: 17 mg/dL (ref 8–27)
CO2: 23 mmol/L (ref 20–29)
Calcium: 9.7 mg/dL (ref 8.7–10.3)
Chloride: 103 mmol/L (ref 96–106)
Creatinine, Ser: 0.74 mg/dL (ref 0.57–1.00)
Glucose: 103 mg/dL — ABNORMAL HIGH (ref 70–99)
Potassium: 4.3 mmol/L (ref 3.5–5.2)
Sodium: 143 mmol/L (ref 134–144)
eGFR: 87 mL/min/{1.73_m2} (ref >59)

## 2023-04-04 LAB — MAGNESIUM: Magnesium: 2.3 mg/dL (ref 1.6–2.3)

## 2023-04-04 NOTE — Progress Notes (Signed)
Reviewed echo results with [name] 04/04/23.

## 2023-04-04 NOTE — Progress Notes (Signed)
Reviewed lab results with Ms. Claudette Laws 04/04/23.

## 2023-04-12 ENCOUNTER — Ambulatory Visit (INDEPENDENT_AMBULATORY_CARE_PROVIDER_SITE_OTHER): Payer: Medicare PPO | Admitting: Psychology

## 2023-04-12 DIAGNOSIS — F324 Major depressive disorder, single episode, in partial remission: Secondary | ICD-10-CM

## 2023-04-12 NOTE — Progress Notes (Signed)
[facility] Counselor/Therapist Progress Note  Patient ID: Mia Underwood, MRN: [id number]   Date: 04/12/23  Time Spent: 3:11 pm - 4:03 pm :  Treatment Type: Individual Therapy.  Reported Symptoms: depression, anxiety, and interpersonal stressors  Mental Status Exam: Appearance:  Casual     Behavior: Appropriate  Motor: Normal  Speech/Language:  Clear and Coherent  Affect: Appropriate  Mood: dysthymic  Thought process: normal  Thought content:   WNL  Sensory/Perceptual disturbances:   WNL  Orientation: oriented to person, place, time/date, and situation  Attention: Good  Concentration: Good  Memory: WNL  Fund of knowledge:  Good  Insight:   Good  Judgment:  Good  Impulse Control: Good   Risk Assessment: Danger to Self:  No Self-injurious Behavior: No Danger to Others: No Duty to Warn:no Physical Aggression / Violence:No  Access to Firearms a concern: No  Gang Involvement:No   Subjective:   Mia Underwood participated from her home, via video, and consented to treatment. Therapist participated from home office. We conducted the the session online due to COVID pandemic. We switched to audio due to technical difficulties. Mia Underwood reviewed the events of the past week.Mia Underwood noted being ready for a vacation.She noted being agitated by a family member and noted her attempts to provide advice to no avail. She noted her family's over reliance on her. WE worked on identifying boundaries. 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 We explored this during the session and ways to begin to develop time for self and disengaging from antagonistic family members. Therapist modeled assertiveness and boundary setting. Mia Underwood was engaged and motivated and noted here interest in following up on her session goals. Therapist provided supportive therapy.  A follow-up was scheduled for continued  treatment.   Interventions: CBT  Diagnosis:  Major depressive disorder with single episode, in partial remission [facility])   Treatment Plan:  Client Abilities/Strengths Mia Underwood is forthcoming and motivated for change.   Support System: Family and friends.   Client Treatment Preferences Outpatient therapy  Client Statement of Needs Karitza would like to manage mood, reduce frustration tolerance, manage irritability, process passed events, build and maintain boundaries, and manage current interpersonal stressors.    Treatment Level Weekly  Symptoms  Anxiety: feeling nervous, difficulty managing worry, worrying about different things, trouble relaxing, restlessness, irritability, feeling afraid something awful might happen   (Status: maintained)  Depression: loss of interest, feeling down, trouble sleeping, lethargy, overeating, feeling bad about self, difficulty concentrating, psychomotor agitation   (Status: maintained)  Goals:   Mia Underwood experiences symptoms of  depression and anxiety   Target Date: 10/10/23 Frequency: Weekly  Progress: 0 Modality: individual    Therapist will provide referrals for additional resources as appropriate.  Therapist will provide psycho-education regarding Mia Underwood's diagnosis and corresponding treatment approaches and interventions. Licensed Clinical Social Worker, [place], LCSW will support the patient's ability to achieve the goals identified. will employ CBT, BA, Problem-solving, Solution Focused, Mindfulness,  coping skills, & other evidenced-based practices will be used to promote progress towards healthy functioning to help manage decrease symptoms associated with his diagnosis.   Reduce overall level, frequency, and intensity of the feelings of depression, anxiety and panic evidenced by decreased overall symptoms from 6 to 7 days/week to 0 to 1 days/week per client report for at least 3 consecutive months. Verbally express understanding of the  relationship between feelings of depression, anxiety and their impact  on thinking patterns and behaviors. Verbalize an understanding of the role that distorted thinking plays in creating fears, excessive worry, and ruminations.    Mia Underwood participated in the creation of the treatment plan)    [name], LCSW

## 2023-04-17 ENCOUNTER — Other Ambulatory Visit: Payer: Self-pay | Admitting: Internal Medicine

## 2023-04-17 DIAGNOSIS — I10 Essential (primary) hypertension: Secondary | ICD-10-CM

## 2023-04-17 DIAGNOSIS — E7800 Pure hypercholesterolemia, unspecified: Secondary | ICD-10-CM

## 2023-04-18 ENCOUNTER — Other Ambulatory Visit: Payer: Self-pay | Admitting: Internal Medicine

## 2023-04-18 DIAGNOSIS — I10 Essential (primary) hypertension: Secondary | ICD-10-CM

## 2023-05-04 ENCOUNTER — Encounter: Payer: Medicare PPO | Admitting: Psychology

## 2023-05-04 DIAGNOSIS — F324 Major depressive disorder, single episode, in partial remission: Secondary | ICD-10-CM

## 2023-05-04 NOTE — Progress Notes (Unsigned)
[facility] Counselor/Therapist Progress Note  Patient ID: Mia Underwood, MRN: [id number]   Date: 05/04/23  Time Spent: 11:48 pm - *** pm : *** Minutes  Treatment Type: Individual Therapy.  Reported Symptoms: depression, anxiety, and interpersonal stressors  Mental Status Exam: Appearance:  Casual     Behavior: Appropriate  Motor: Normal  Speech/Language:  Clear and Coherent  Affect: Appropriate  Mood: dysthymic  Thought process: normal  Thought content:   WNL  Sensory/Perceptual disturbances:   WNL  Orientation: oriented to person, place, time/date, and situation  Attention: Good  Concentration: Good  Memory: WNL  Fund of knowledge:  Good  Insight:   Good  Judgment:  Good  Impulse Control: Good   Risk Assessment: Danger to Self:  No Self-injurious Behavior: No Danger to Others: No Duty to Warn:no Physical Aggression / Violence:No  Access to Firearms a concern: No  Gang Involvement:No   Subjective:   Mia Underwood participated from her home, via video, and consented to treatment. Therapist participated from home office. We conducted the the session online due to COVID pandemic.  We switched to audio due to technical difficulties. Warrene reviewed the events of the past week.   Mia Underwood noted being ready for a vacation.She noted being agitated by a family member and noted her attempts to provide advice to no avail. She noted her family's over reliance on her. WE worked on identifying boundaries. 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 We explored this during the session and ways to begin to develop time for self and disengaging from antagonistic family members. Therapist modeled assertiveness and boundary setting. Mia Underwood was engaged and motivated and noted here interest in following up on her session goals. Therapist provided supportive therapy.  A follow-up was scheduled for  continued treatment.   Interventions: CBT  Diagnosis:  Major depressive disorder with single episode, in partial remission [facility])   Treatment Plan:  Client Abilities/Strengths Mia Underwood is forthcoming and motivated for change.   Support System: Family and friends.   Client Treatment Preferences Outpatient therapy  Client Statement of Needs Mia Underwood would like to manage mood, reduce frustration tolerance, manage irritability, process passed events, build and maintain boundaries, and manage current interpersonal stressors.    Treatment Level Weekly  Symptoms  Anxiety: feeling nervous, difficulty managing worry, worrying about different things, trouble relaxing, restlessness, irritability, feeling afraid something awful might happen   (Status: maintained)  Depression: loss of interest, feeling down, trouble sleeping, lethargy, overeating, feeling bad about self, difficulty concentrating, psychomotor agitation   (Status: maintained)  Goals:   Mia Underwood experiences symptoms of  depression and anxiety   Target Date: 10/10/23 Frequency: Weekly  Progress: 0 Modality: individual    Therapist will provide referrals for additional resources as appropriate.  Therapist will provide psycho-education regarding Mia Underwood's diagnosis and corresponding treatment approaches and interventions. Licensed Clinical Social Worker, [place], LCSW will support the patient's ability to achieve the goals identified. will employ CBT, BA, Problem-solving, Solution Focused, Mindfulness,  coping skills, & other evidenced-based practices will be used to promote progress towards healthy functioning to help manage decrease symptoms associated with his diagnosis.   Reduce overall level, frequency, and intensity of the feelings of depression, anxiety and panic evidenced by decreased overall symptoms from 6 to 7 days/week to 0 to 1 days/week per client report for at least 3 consecutive months. Verbally express  understanding of the relationship between feelings of  depression, anxiety and their impact on thinking patterns and behaviors. Verbalize an understanding of the role that distorted thinking plays in creating fears, excessive worry, and ruminations.    Mia Underwood participated in the creation of the treatment plan)    [name], LCSW

## 2023-05-07 NOTE — Progress Notes (Signed)
This encounter was created in error - please disregard.  This encounter was created in error - please disregard.

## 2023-05-29 ENCOUNTER — Ambulatory Visit (INDEPENDENT_AMBULATORY_CARE_PROVIDER_SITE_OTHER): Payer: Medicare PPO | Admitting: Psychology

## 2023-05-29 DIAGNOSIS — F324 Major depressive disorder, single episode, in partial remission: Secondary | ICD-10-CM | POA: Diagnosis not present

## 2023-05-29 DIAGNOSIS — F3342 Major depressive disorder, recurrent, in full remission: Secondary | ICD-10-CM

## 2023-05-29 NOTE — Progress Notes (Signed)
[facility] Counselor/Therapist Progress Note  Patient ID: Mia Underwood, MRN: [id number]   Date: 05/29/23  Time Spent: 8:01 am - 9:00 am : 59 Minutes  Treatment Type: Individual Therapy.  Reported Symptoms: depression, anxiety, and interpersonal stressors  Mental Status Exam: Appearance:  Casual     Behavior: Appropriate  Motor: Normal  Speech/Language:  Clear and Coherent  Affect: Appropriate  Mood: dysthymic  Thought process: normal  Thought content:   WNL  Sensory/Perceptual disturbances:   WNL  Orientation: oriented to person, place, time/date, and situation  Attention: Good  Concentration: Good  Memory: WNL  Fund of knowledge:  Good  Insight:   Good  Judgment:  Good  Impulse Control: Good   Risk Assessment: Danger to Self:  No Self-injurious Behavior: No Danger to Others: No Duty to Warn:no Physical Aggression / Violence:No  Access to Firearms a concern: No  Gang Involvement:No   Subjective:   Mia Underwood participated from the office consented to treatment with the therapist. Mia Underwood noted wanting to create distance between her and her grandchildren going forward. She noted feeling her youngest grandchild being rude and disrespectful and noted her own frustration regarding this behavior. She noted her inclination to "give her notice" to move out. We explored this frustration during the session. She noted that her grandchildren are not invested in their relationship with her and noted that her grand-daughter, who lives with her, does not engage in niceties, cleaning after herself, and engaging positively. We worked on processing this and Mia Underwood noted a need for a change. We discussed communication, assertiveness, and boundary setting. Therapist modeled this during the session. We worked on identifying boundaries to enact, going forward. She noted working on exercising regularly and noted walking 2 miles at the gym on a regularly and walks at home on her  off days. Therapist praised Mia Underwood for her effort in this area. Therapist validated Mia Underwood's feelings and experience and encouraged self-care and continued boundaries with stressful relationships including her neighbor. Mia Underwood was engaged and motivated during the session. She expressed commitment towards our goals. Therapist praised Mia Underwood for her effort and energy and provided supportive therapy.   Interventions: CBT & interpersonal  Diagnosis:  Major depressive disorder with single episode, in partial remission (HCC)  Recurrent major depressive disorder, in full remission [facility])   Treatment Plan:  Client Abilities/Strengths Mia Underwood is forthcoming and motivated for change.   Support System: Family and friends.   Client Treatment Preferences Outpatient therapy  Client Statement of Needs Mia Underwood would like to manage mood, reduce frustration tolerance, manage irritability, process passed events, build and maintain boundaries, and manage current interpersonal stressors.    Treatment Level Weekly  Symptoms  Anxiety: feeling nervous, difficulty managing worry, worrying about different things, trouble relaxing, restlessness, irritability, feeling afraid something awful might happen   (Status: maintained)  Depression: loss of interest, feeling down, trouble sleeping, lethargy, overeating, feeling bad about self, difficulty concentrating, psychomotor agitation   (Status: maintained)  Goals:   Mia Underwood experiences symptoms of  depression and anxiety   Target Date: 10/10/23 Frequency: Weekly  Progress: 0 Modality: individual    Therapist will provide referrals for additional resources as appropriate.  Therapist will provide psycho-education regarding Mia Underwood's diagnosis and corresponding treatment approaches and interventions. Licensed Clinical Social Worker, [place], LCSW will support the patient's ability to achieve the goals identified. will employ CBT, BA, Problem-solving, Solution  Focused, Mindfulness,  coping skills, & other evidenced-based practices will be used to promote progress towards  healthy functioning to help manage decrease symptoms associated with his diagnosis.   Reduce overall level, frequency, and intensity of the feelings of depression, anxiety and panic evidenced by decreased overall symptoms from 6 to 7 days/week to 0 to 1 days/week per client report for at least 3 consecutive months. Verbally express understanding of the relationship between feelings of depression, anxiety and their impact on thinking patterns and behaviors. Verbalize an understanding of the role that distorted thinking plays in creating fears, excessive worry, and ruminations.    Mia Underwood participated in the creation of the treatment plan)    [name], LCSW                  [name], LCSW

## 2023-06-05 ENCOUNTER — Encounter (HOSPITAL_COMMUNITY): Payer: Self-pay | Admitting: Psychiatry

## 2023-06-05 ENCOUNTER — Other Ambulatory Visit: Payer: Self-pay

## 2023-06-05 ENCOUNTER — Ambulatory Visit (HOSPITAL_BASED_OUTPATIENT_CLINIC_OR_DEPARTMENT_OTHER): Payer: Medicare PPO | Admitting: Psychiatry

## 2023-06-05 VITALS — BP 156/83 | HR 80 | Ht 61.0 in | Wt 157.0 lb

## 2023-06-05 DIAGNOSIS — F325 Major depressive disorder, single episode, in full remission: Secondary | ICD-10-CM | POA: Diagnosis not present

## 2023-06-05 MED ORDER — SERTRALINE HCL 100 MG PO TABS: ORAL_TABLET | ORAL | 5 refills | Status: DC

## 2023-06-05 MED ORDER — TRAZODONE HCL 50 MG PO TABS: 100.0000 mg | ORAL_TABLET | Freq: Every day | ORAL | 3 refills | Status: DC

## 2023-06-05 NOTE — Progress Notes (Signed)
Psychiatric Initial Adult Assessment   Patient Identification: Mia Underwood MRN:  [id number] Date of Evaluation:  06/05/2023 Referral Source: Waynetta Sandy McKinzie Chief Complaint: Clinical dg along well as best she can. Virtual Visit via Telephone Note    Today the patient is in the office.  She is doing really well.  She is very stable.  She is not letting her neighbors bother her.  She is close with her brother and she spent time with him and the rest of the family on July 4.  The patient continues in one-to-one therapy and has a very good therapy relationship.  Her blood pressure is reasonably well-controlled.  The patient reads a little bit but mainly she spends a lot of time doing crossword puzzles.  She also goes to the gym regularly.  She does not smoke cigarettes she uses no drugs or alcohol.  She is not really taking any Vistaril for acute anxiety because anxiety is well controlled.  Patient is functioning very well..   I connected with AREONNA Underwood on 06/05/23 at  4:00 PM EDT by telephone and verified that I am speaking with the correct person using two identifiers.  Location: Patient: home Provider: office   I discussed the limitations, risks, security and privacy concerns of performing an evaluation and management service by telephone and the availability of in person appointments. I also discussed with the patient that there may be a patient responsible charge related to this service. The patient expressed understanding and agreed to proceed.  ructions:    I discussed the assessment and treatment plan with the patient. The patient was provided an opportunity to ask questions and all were answered. The patient agreed with the plan and demonstrated an understanding of the instructions.   The patient was advised to call back or seek an in-person evaluation if the symptoms worsen or if the condition fails to improve as anticipated.  I provided 15 minutes of non-face-to-face time  during this encounter.   Gypsy Balsam, MD  Past Psychiatric History:   Previous Psychotropic Medications: Yes   Substance Abuse History in the last 12 months:  Yes.    Consequences of Substance Abuse:   Past Medical History:  Past Medical History:  Diagnosis Date   Allergic rhinitis 05/04/2016   Arthritis    Bilateral carpal tunnel syndrome 10/18/2010   Left > Right    Colles' fracture of left radius 07/01/2019   Diverticulosis of sigmoid colon 06/07/2010   Essential hypertension 09/14/2009   Healthcare maintenance 01/03/2016   Hyperlipidemia 09/14/2009   Insomnia secondary to depression with anxiety 05/04/2016   Major depression, recurrent, chronic (HCC) 09/14/2009   Marijuana abuse 04/04/2016   Muscle spasm of left shoulder 02/03/2019   Tobacco abuse 01/17/2016   Upper respiratory infection, viral 02/03/2019   Vitamin D deficiency 04/04/2016    Past Surgical History:  Procedure Laterality Date   ABDOMINAL HYSTERECTOMY  April 1996   For fibroids   COLONOSCOPY  07/18/2010   Patterson-tic's    Family Psychiatric History:   Family History:  Family History  Problem Relation Age of Onset   Breast cancer Mother    Depression Mother    Hypertension Mother    Dementia Mother    Alcoholism Father        Died in his 57's related to alcohol abuse   Leukemia Sister 8   Early death Brother        Died in a Librarian, academic accident   Heart  attack Daughter 63       Per patient report, specifics unknown   Diabetes Mellitus II Maternal Grandmother    Diabetes Mellitus II Paternal Grandmother    Healthy Sister    Healthy Sister    Arthritis Brother    Colon polyps Brother    Healthy Brother    Healthy Brother    Healthy Son    Breast cancer Maternal Aunt    Breast cancer Cousin    Breast cancer Maternal Aunt    Colon cancer Neg Hx    Esophageal cancer Neg Hx    Rectal cancer Neg Hx    Stomach cancer Neg Hx     Social History:   Social History    Socioeconomic History   Marital status: Single    Spouse name: Not on file   Number of children: 2   Years of education: 12 grade   Highest education level: Not on file  Occupational History   Occupation: Research officer, 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[employer]    Comment: Retired  Tobacco Use   Smoking status: Former    Packs/day: 0.20    Years: 48.00    Additional pack years: 0.00    Total pack years: 9.60    Types: Cigarettes    Start date: 12/02/1973    Quit date: 07/2022    Years since quitting: 0.8   Smokeless tobacco: Never  Vaping Use   Vaping Use: Never used  Substance and Sexual Activity   Alcohol use: Yes    Alcohol/week: 2.0 standard drinks of alcohol    Types: 1 Glasses of wine, 1 Cans of beer per week    Comment: drinks socially with friends   Drug use: No   Sexual activity: Not Currently  Other Topics Concern   Not on file  Social History Narrative   Graduated from [employer] in [place].  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      Current Social History 03/01/2021   Patient lives with granddaughter in an apartment on the first floor. There are not steps up to the entrance the patient uses.       Patient's method of transportation is personal car.      The highest level of education was some college.      The patient currently retired.      Identified important Relationships are "My brother, my son"       Pets : None, but I would like to have one       Interests / Fun: Love to read, play Copywriter, advertising, Control and instrumentation engineer with sisters       Current Stressors: My community is getting violent, strained family relationships      Religious / Personal Beliefs: Baptized [employer]       Social Determinants of Health   Financial Resource Strain: Medium Risk (02/12/2023)   Overall Financial Resource Strain (CARDIA)    Difficulty of Paying Living Expenses: Somewhat hard  Food Insecurity: Food Insecurity Present (02/12/2023)   Hunger Vital Sign     Worried About Running Out of Food in the Last Year: Sometimes true    Ran Out of Food in the Last Year: Sometimes true  Transportation Needs: No Transportation Needs (02/12/2023)   PRAPARE - Administrator, Civil Service (Medical): No    Lack of Transportation (Non-Medical): No  Physical Activity: Inactive (02/12/2023)   Exercise Vital Sign    Days of  Exercise per Week: 0 days    Minutes of Exercise per Session: 0 min  Stress: Stress Concern Present (02/12/2023)   [employer] of Occupational Health - Occupational Stress Questionnaire    Feeling of Stress : Very much  Social Connections: Socially Isolated (02/12/2023)   Social Connection and Isolation Panel [NHANES]    Frequency of Communication with Friends and Family: Once a week    Frequency of Social Gatherings with Friends and Family: Never    Attends Religious Services: Never    Database administrator or Organizations: No    Attends Banker Meetings: Never    Marital Status: Divorced    Additional Social History:   Allergies:   Allergies  Allergen Reactions   Penicillins Hives   Naproxen Rash    Pt states she takes Aleve without problems    Metabolic Disorder Labs: No results found for: "HGBA1C", "MPG" No results found for: "PROLACTIN" Lab Results  Component Value Date   CHOL 167 04/04/2022   TRIG 76 04/04/2022   HDL 57 04/04/2022   CHOLHDL 2.9 04/04/2022   VLDL 20 11/01/2010   LDLCALC 96 04/04/2022   LDLCALC 70 06/20/2021     Current Medications: Current Outpatient Medications  Medication Sig Dispense Refill   amLODipine-olmesartan (AZOR) 10-40 MG tablet Take 1 tablet by mouth daily. 90 tablet 3   cetirizine (ZYRTEC) 10 MG tablet Take 1 tablet (10 mg total) by mouth daily. 90 tablet 3   Cholecalciferol (VITAMIN D) 50 MCG (2000 UT) tablet Take 1 tablet (2,000 Units total) by mouth daily. 90 tablet 3   cyanocobalamin (VITAMIN B12) 1000 MCG tablet Take 1 tablet (1,000 mcg total) by  mouth daily. 90 tablet 3   diclofenac Sodium (VOLTAREN) 1 % GEL Apply 4 g topically 4 (four) times daily. 150 g 3   gabapentin (NEURONTIN) 600 MG tablet Take 1 tablet (600 mg total) by mouth daily. 90 tablet 3   hydrochlorothiazide (HYDRODIURIL) 25 MG tablet TAKE 1 TABLET(25 MG) BY MOUTH DAILY 90 tablet 3   hydrOXYzine (VISTARIL) 25 MG capsule 1  qday  prn 30 capsule 4   rosuvastatin (CRESTOR) 20 MG tablet Take 1 tablet (20 mg total) by mouth daily. 90 tablet 3   vitamin E 180 MG (400 UNITS) capsule Take 400 Units by mouth daily.     sertraline (ZOLOFT) 100 MG tablet 1  qam 60 tablet 5   traZODone (DESYREL) 50 MG tablet Take 2 tablets (100 mg total) by mouth at bedtime. 60 tablet 3   No current facility-administered medications for this visit.    Neurologic: Headache: No Seizure: No Paresthesias:No  Musculoskeletal: Strength & Muscle Tone: within normal limits Gait & Station: normal Patient leans: N/A  Psychiatric Specialty Exam: ROS  Blood pressure (!) 156/83, pulse 80, height 5\' 1"  (1.549 m), weight 157 lb (71.2 kg).Body mass index is 29.66 kg/m.  General Appearance: Casual  Eye Contact:  Fair  Speech:  Clear and Coherent  Volume:  Normal  Mood:  Depressed  Affect:  Blunt  Thought Process:  Goal Directed  Orientation:  Full (Time, Place, and Person)  Thought Content:  WDL  Suicidal Thoughts:  No  Homicidal Thoughts:  No  Memory:  NA  Judgement:  Good  Insight:  Good  Psychomotor Activity:  Normal  Concentration:    Recall:  Fair  Fund of Knowledge:Good  Language: Poor  Akathisia:  No  Handed:  Right  AIMS (if indicated):    Assets:  Desire for Improvement  ADL's:  Intact  Cognition: WNL  Sleep:      Treatment Plan Summary: 7/9/20244:21 PM  This patient's diagnosis is major depression in remission.  She continues taking 100 mg of Zoloft.  Her second problem is insomnia.  She continues taking trazodone for this condition.  She will take Vistaril on a as  needed basis.  She will continue in one-to-one therapy and return to see me in 5 months.

## 2023-06-07 ENCOUNTER — Telehealth: Payer: Self-pay | Admitting: *Deleted

## 2023-06-07 NOTE — Telephone Encounter (Addendum)
Pt called / informed of Dr Graciella Freer response. Pt stated she can schedule an appt - call transferred to the front office. Appt schedule with Dr Rosaura Carpenter 7/15 @ 1100Am.

## 2023-06-07 NOTE — Telephone Encounter (Signed)
Please schedule next available appointment to be seen for these concerns. Her calcium and potassium levels were normal at last visit. Thank you.

## 2023-06-07 NOTE — Telephone Encounter (Signed)
Call from pt c/o left leg cramping; calf area. Started early Sunday morning. Now she states she's limping. Denies any swelling or warmth of leg. States she has taken Ostocal before. She wants to know if her doctor could prescribe a calcium and/or potassium pill? Last BMP was 04/02/23. Thanks

## 2023-06-08 ENCOUNTER — Ambulatory Visit
Admission: RE | Admit: 2023-06-08 | Discharge: 2023-06-08 | Disposition: A | Discharge status: Home / Self Care | Payer: Medicare PPO | Source: Ambulatory Visit | Attending: Internal Medicine | Admitting: Internal Medicine

## 2023-06-08 DIAGNOSIS — Z780 Asymptomatic menopausal state: Secondary | ICD-10-CM

## 2023-06-08 DIAGNOSIS — M8588 Other specified disorders of bone density and structure, other site: Secondary | ICD-10-CM | POA: Diagnosis not present

## 2023-06-08 DIAGNOSIS — N958 Other specified menopausal and perimenopausal disorders: Secondary | ICD-10-CM | POA: Diagnosis not present

## 2023-06-08 DIAGNOSIS — E349 Endocrine disorder, unspecified: Secondary | ICD-10-CM | POA: Diagnosis not present

## 2023-06-11 ENCOUNTER — Encounter: Payer: Self-pay | Admitting: Student

## 2023-06-11 ENCOUNTER — Ambulatory Visit: Payer: Medicare PPO | Admitting: Student

## 2023-06-11 VITALS — BP 116/64 | HR 99 | Ht 61.0 in | Wt 155.7 lb

## 2023-06-11 DIAGNOSIS — R252 Cramp and spasm: Secondary | ICD-10-CM

## 2023-06-11 DIAGNOSIS — R739 Hyperglycemia, unspecified: Secondary | ICD-10-CM | POA: Diagnosis not present

## 2023-06-11 DIAGNOSIS — L989 Disorder of the skin and subcutaneous tissue, unspecified: Secondary | ICD-10-CM

## 2023-06-11 DIAGNOSIS — E7800 Pure hypercholesterolemia, unspecified: Secondary | ICD-10-CM

## 2023-06-11 DIAGNOSIS — I10 Essential (primary) hypertension: Secondary | ICD-10-CM | POA: Diagnosis not present

## 2023-06-11 DIAGNOSIS — R7303 Prediabetes: Secondary | ICD-10-CM | POA: Insufficient documentation

## 2023-06-11 DIAGNOSIS — E785 Hyperlipidemia, unspecified: Secondary | ICD-10-CM

## 2023-06-11 MED ORDER — ROSUVASTATIN CALCIUM 20 MG PO TABS: 20.0000 mg | ORAL_TABLET | Freq: Every day | ORAL | 3 refills | Status: DC

## 2023-06-11 NOTE — Progress Notes (Signed)
Subjective:  CC: Skin lesions, cramping  HPI:  [name] is a 72 y.o. female with a past medical history stated below and presents today for skin lesions and cramping. Please see problem based assessment and plan for additional details.  Past Medical History:  Diagnosis Date   Allergic rhinitis 05/04/2016   Arthritis    Bilateral carpal tunnel syndrome 10/18/2010   Left > Right    Colles' fracture of left radius 07/01/2019   Diverticulosis of sigmoid colon 06/07/2010   Essential hypertension 09/14/2009   Healthcare maintenance 01/03/2016   Hyperlipidemia 09/14/2009   Insomnia secondary to depression with anxiety 05/04/2016   Major depression, recurrent, chronic (HCC) 09/14/2009   Marijuana abuse 04/04/2016   Muscle spasm of left shoulder 02/03/2019   Tobacco abuse 01/17/2016   Upper respiratory infection, viral 02/03/2019   Vitamin D deficiency 04/04/2016    Current Outpatient Medications on File Prior to Visit  Medication Sig Dispense Refill   amLODipine-olmesartan (AZOR) 10-40 MG tablet Take 1 tablet by mouth daily. 90 tablet 3   cetirizine (ZYRTEC) 10 MG tablet Take 1 tablet (10 mg total) by mouth daily. 90 tablet 3   gabapentin (NEURONTIN) 600 MG tablet Take 1 tablet (600 mg total) by mouth daily. 90 tablet 3   hydrochlorothiazide (HYDRODIURIL) 25 MG tablet TAKE 1 TABLET(25 MG) BY MOUTH DAILY 90 tablet 3   hydrOXYzine (VISTARIL) 25 MG capsule 1  qday  prn 30 capsule 4   sertraline (ZOLOFT) 100 MG tablet 1  qam 60 tablet 5   traZODone (DESYREL) 50 MG tablet Take 2 tablets (100 mg total) by mouth at bedtime. 60 tablet 3   Cholecalciferol (VITAMIN D) 50 MCG (2000 UT) tablet Take 1 tablet (2,000 Units total) by mouth daily. 90 tablet 3   cyanocobalamin (VITAMIN B12) 1000 MCG tablet Take 1 tablet (1,000 mcg total) by mouth daily. 90 tablet 3   diclofenac Sodium (VOLTAREN) 1 % GEL Apply 4 g topically 4 (four) times daily. 150 g 3   vitamin E 180 MG (400 UNITS)  capsule Take 400 Units by mouth daily.     No current facility-administered medications on file prior to visit.    Family History  Problem Relation Age of Onset   Breast cancer Mother    Depression Mother    Hypertension Mother    Dementia Mother    Alcoholism Father        Died in his 69's related to alcohol abuse   Leukemia Sister 8   Early death Brother        Died in a motor vehicle accident   Heart attack Daughter 68       Per patient report, specifics unknown   Diabetes Mellitus II Maternal Grandmother    Diabetes Mellitus II Paternal Grandmother    Healthy Sister    Healthy Sister    Arthritis Brother    Colon polyps Brother    Healthy Brother    Healthy Brother    Healthy Son    Breast cancer Maternal Aunt    Breast cancer Cousin    Breast cancer Maternal Aunt    Colon cancer Neg Hx    Esophageal cancer Neg Hx    Rectal cancer Neg Hx    Stomach cancer Neg Hx     Social History   Socioeconomic History   Marital status: Single    Spouse name: Not on file   Number of children: 2   Years of education: 16  grade   Highest education level: Not on file  Occupational History   Occupation: Research officer, 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[employer]    Comment: Retired  Tobacco Use   Smoking status: Former    Current packs/day: 0.00    Average packs/day: 0.2 packs/day for 48.7 years (9.7 ttl pk-yrs)    Types: Cigarettes    Start date: 12/02/1973    Quit date: 07/2022    Years since quitting: 0.8   Smokeless tobacco: Never  Vaping Use   Vaping status: Never Used  Substance and Sexual Activity   Alcohol use: Yes    Alcohol/week: 2.0 standard drinks of alcohol    Types: 1 Glasses of wine, 1 Cans of beer per week    Comment: drinks socially with friends   Drug use: No   Sexual activity: Not Currently  Other Topics Concern   Not on file  Social History Narrative   Graduated from [employer] in [place].  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      Current  Social History 03/01/2021   Patient lives with granddaughter in an apartment on the first floor. There are not steps up to the entrance the patient uses.       Patient's method of transportation is personal car.      The highest level of education was some college.      The patient currently retired.      Identified important Relationships are "My brother, my son"       Pets : None, but I would like to have one       Interests / Fun: Love to read, play Copywriter, advertising, Control and instrumentation engineer with sisters       Current Stressors: My community is getting violent, strained family relationships      Religious / Personal Beliefs: Baptized [employer]       Social Determinants of Health   Financial Resource Strain: Medium Risk (02/12/2023)   Overall Financial Resource Strain (CARDIA)    Difficulty of Paying Living Expenses: Somewhat hard  Food Insecurity: Food Insecurity Present (02/12/2023)   Hunger Vital Sign    Worried About Running Out of Food in the Last Year: Sometimes true    Ran Out of Food in the Last Year: Sometimes true  Transportation Needs: No Transportation Needs (02/12/2023)   PRAPARE - Administrator, Civil Service (Medical): No    Lack of Transportation (Non-Medical): No  Physical Activity: Inactive (02/12/2023)   Exercise Vital Sign    Days of Exercise per Week: 0 days    Minutes of Exercise per Session: 0 min  Stress: Stress Concern Present (02/12/2023)   [employer] of Occupational Health - Occupational Stress Questionnaire    Feeling of Stress : Very much  Social Connections: Socially Isolated (02/12/2023)   Social Connection and Isolation Panel [NHANES]    Frequency of Communication with Friends and Family: Once a week    Frequency of Social Gatherings with Friends and Family: Never    Attends Religious Services: Never    Database administrator or Organizations: No    Attends Banker Meetings: Never    Marital Status: Divorced   Catering manager Violence: Not At Risk (02/12/2023)   Humiliation, Afraid, Rape, and Kick questionnaire    Fear of Current or Ex-Partner: No    Emotionally Abused: No    Physically Abused: No    Sexually Abused: No  Review of Systems: ROS negative except for what is noted on the assessment and plan.  Objective:   Vitals:   06/11/23 1104  BP: 116/64  Pulse: 99  SpO2: 95%  Weight: 155 lb 11.2 oz (70.6 kg)  Height: 5\' 1"  (1.549 m)    Physical Exam: Constitutional: well-appearing in no acute distress HENT: normocephalic atraumatic, mucous membranes moist Eyes: conjunctiva non-erythematous Neck: supple Cardiovascular: regular rate and rhythm, no m/r/g Pulmonary/Chest: normal work of breathing on room air, lungs clear to auscultation bilaterally Abdominal: soft, non-tender, non-distended MSK: normal bulk and tone Skin: warm and dry Psych: Normal mood and affect   Assessment & Plan:  Leg cramping Patient called our office on 06/07/2023, complaining of cramping which has begun on 06/03/2023.  Patient states that at the time she was limping from the cramping, denies swelling or warmth of the leg, and says she has taken also Calvey for which may have helped with her cramping.  She asked that we can prescribe a calcium or potassium pill.  Last BMP was on 04/02/2023, which showed no electrolyte abnormalities.  Magnesium was obtained and also normal.  Patient denies any swelling, warmth, or claudication of the lower extremity.  He says the cramping comes on by itself, and resolves on its own.  It is not related to activity.   Plan: Repeat BMP today to assess for electrolyte abnormality Encourage p.o. water intake   Hyperglycemia Patient has a history of elevated glucose readings on BMPs in the past, BMI borderline obese today (29.42).  We will screen the patient for diabetes given these findings. Plan: HbA1c today.  Hyperlipidemia Patient has history of dyslipidemia, on  medication review today patient is not taking her rosuvastatin.  States that she likely needs a refill. Plan: Refill rosuvastatin today.   Patient seen with Dr. Cleda Daub  Lovie Macadamia MD [facility]  PGY-1 Pager: [phone]  Phone: [phone] Date 06/11/2023  Time 12:55 PM

## 2023-06-11 NOTE — Assessment & Plan Note (Signed)
Patient called our office on 06/07/2023, complaining of cramping which has begun on 06/03/2023.  Patient states that at the time she was limping from the cramping, denies swelling or warmth of the leg, and says she has taken also Calvey for which may have helped with her cramping.  She asked that we can prescribe a calcium or potassium pill.  Last BMP was on 04/02/2023, which showed no electrolyte abnormalities.  Magnesium was obtained and also normal.  Patient denies any swelling, warmth, or claudication of the lower extremity.  He says the cramping comes on by itself, and resolves on its own.  It is not related to activity.   Plan: Repeat BMP today to assess for electrolyte abnormality Encourage p.o. water intake

## 2023-06-11 NOTE — Patient Instructions (Addendum)
Thank you, [name] for allowing [place] to provide your care today. Today we discussed cramping, skin findings.    I have ordered the following labs for you:  Lab Orders         BMP8+Anion Gap         Hemoglobin A1c      Referrals ordered today:   Referral Orders  No referral(s) requested today     I have ordered the following medication/changed the following medications:   Stop the following medications: Medications Discontinued During This Encounter  Medication Reason   rosuvastatin (CRESTOR) 20 MG tablet Reorder     Start the following medications: Meds ordered this encounter  Medications   rosuvastatin (CRESTOR) 20 MG tablet    Sig: Take 1 tablet (20 mg total) by mouth daily.    Dispense:  90 tablet    Refill:  3     Follow up: 4-6 months   We look forward to seeing you next time. Please call our clinic at [phone] if you have any questions or concerns. The best time to call is Monday-Friday from 9am-4pm, but there is someone available 24/7. If after hours or the weekend, call the main hospital number and ask for the Internal Medicine Resident On-Call. If you need medication refills, please notify your pharmacy one week in advance and they will send [place] a request.   Thank you for trusting me with your care. Wishing you the best!  Lovie Macadamia MD [facility]

## 2023-06-11 NOTE — Assessment & Plan Note (Signed)
Patient has history of dyslipidemia, on medication review today patient is not taking her rosuvastatin.  States that she likely needs a refill. Plan: Refill rosuvastatin today.

## 2023-06-11 NOTE — Assessment & Plan Note (Signed)
Patient has a history of elevated glucose readings on BMPs in the past, BMI borderline obese today (29.42).  We will screen the patient for diabetes given these findings. Plan: HbA1c today.

## 2023-06-12 ENCOUNTER — Telehealth: Payer: Self-pay | Admitting: Student

## 2023-06-12 ENCOUNTER — Ambulatory Visit: Payer: Medicare PPO | Admitting: Psychology

## 2023-06-12 LAB — BMP8+ANION GAP
Anion Gap: 16 mmol/L (ref 10.0–18.0)
BUN/Creatinine Ratio: 26 (ref 12–28)
BUN: 22 mg/dL (ref 8–27)
CO2: 21 mmol/L (ref 20–29)
Calcium: 9.7 mg/dL (ref 8.7–10.3)
Chloride: 105 mmol/L (ref 96–106)
Creatinine, Ser: 0.85 mg/dL (ref 0.57–1.00)
Glucose: 102 mg/dL — ABNORMAL HIGH (ref 70–99)
Potassium: 4.5 mmol/L (ref 3.5–5.2)
Sodium: 142 mmol/L (ref 134–144)
eGFR: 74 mL/min/{1.73_m2} (ref >59)

## 2023-06-12 LAB — HEMOGLOBIN A1C
Est. average glucose Bld gHb Est-mCnc: 128 mg/dL
Hgb A1c MFr Bld: 6.1 % — ABNORMAL HIGH (ref 4.8–5.6)

## 2023-06-12 NOTE — Telephone Encounter (Signed)
I spoke with Mia Underwood on the phone. Patient's identity was confirmed using two patient specific identifiers. We discussed her recent lab work.  Her BMP showed elevated glucose, but was otherwise unremarkable for electrolyte abnormality or reduced kidney function.  Her A1c was found to be 6.1%, which puts her in the prediabetic range.  We discussed making some diet and exercise changes. I asked that the patient tries to cut out simple sugars especially in the form of sodas and sweet teas from her diet.  She has a appoint scheduled with [place] on 07/03/2023.

## 2023-06-14 ENCOUNTER — Ambulatory Visit
Admission: RE | Admit: 2023-06-14 | Discharge: 2023-06-14 | Disposition: A | Discharge status: Home / Self Care | Payer: Medicare PPO | Source: Ambulatory Visit | Attending: Internal Medicine | Admitting: Internal Medicine

## 2023-06-14 DIAGNOSIS — Z1231 Encounter for screening mammogram for malignant neoplasm of breast: Secondary | ICD-10-CM | POA: Diagnosis not present

## 2023-06-18 NOTE — Progress Notes (Signed)
DEXA remains consistent with osteopenia. I have been unable to reach Mia Underwood to review these results. I will plan to review them at her upcoming appointment.

## 2023-06-19 ENCOUNTER — Ambulatory Visit: Payer: Medicare PPO | Admitting: Psychology

## 2023-06-19 NOTE — Progress Notes (Signed)
Internal Medicine Clinic Attending  I was physically present during the key portions of the resident provided service and participated in the medical decision making of patient's management care. I reviewed pertinent patient test results.  The assessment, diagnosis, and plan were formulated together and I agree with the documentation in the resident's note.  Gust Rung, DO

## 2023-07-03 ENCOUNTER — Ambulatory Visit: Payer: Medicare PPO | Admitting: Psychology

## 2023-07-03 ENCOUNTER — Encounter: Payer: Self-pay | Admitting: Internal Medicine

## 2023-07-03 ENCOUNTER — Ambulatory Visit (INDEPENDENT_AMBULATORY_CARE_PROVIDER_SITE_OTHER): Payer: Medicare PPO | Admitting: Internal Medicine

## 2023-07-03 VITALS — BP 155/88 | HR 68 | Temp 99.8°F | Ht 61.0 in | Wt 155.4 lb

## 2023-07-03 DIAGNOSIS — E559 Vitamin D deficiency, unspecified: Secondary | ICD-10-CM

## 2023-07-03 DIAGNOSIS — I10 Essential (primary) hypertension: Secondary | ICD-10-CM | POA: Diagnosis not present

## 2023-07-03 DIAGNOSIS — F332 Major depressive disorder, recurrent severe without psychotic features: Secondary | ICD-10-CM | POA: Diagnosis not present

## 2023-07-03 DIAGNOSIS — L989 Disorder of the skin and subcutaneous tissue, unspecified: Secondary | ICD-10-CM | POA: Diagnosis not present

## 2023-07-03 DIAGNOSIS — R7303 Prediabetes: Secondary | ICD-10-CM

## 2023-07-03 DIAGNOSIS — R252 Cramp and spasm: Secondary | ICD-10-CM

## 2023-07-03 DIAGNOSIS — Z87891 Personal history of nicotine dependence: Secondary | ICD-10-CM

## 2023-07-03 DIAGNOSIS — M8580 Other specified disorders of bone density and structure, unspecified site: Secondary | ICD-10-CM

## 2023-07-03 MED ORDER — VITAMIN D (CHOLECALCIFEROL) 25 MCG (1000 UT) PO CAPS
1.0000 | ORAL_CAPSULE | Freq: Every day | ORAL | 3 refills | Status: DC
Start: 2023-07-03 — End: 2024-02-26

## 2023-07-03 NOTE — Assessment & Plan Note (Deleted)
[name] has osteopenia based on her recent DEXA scan. She denied any recent falls. She reports she goes to the gym 3x a week however does not use any weights.  Plan  - Start taking Vitamin 1000 UT - Try weight bearing exercises

## 2023-07-03 NOTE — Assessment & Plan Note (Signed)
Prediabetes with A1c of 6.1% diagnosed at last visit. Continue healthy lifestyle measures including diet and exercise which were discussed today.

## 2023-07-03 NOTE — Patient Instructions (Addendum)
Thank you, [name] for allowing [place] to provide your care today. Today we discussed the following.    Leg cramping - Please drink more water. Below is a list of potassium containing foods you can try.  Foods with potassium: Fruits like bananas, apricots, nectarines, melon, prunes, raisins, kiwi, and oranges. Vegetables, such as potatoes, sweet potatoes, yams, tomatoes, leafy greens, beets, avocado, pumpkin, and winter squash. Beans, like lima beans. Nuts. High Blood pressure - Slightly elevated today which maybe due to the missed medication. Please try not to forget your medicine and try to check it at home.  Nodule - Try not to pop the nodule. You can put warm compress on the area to encourage drainage. Low bone mass- We will send a vitamin D prescription to your pharmacy. Continue exercising and include calcium-rich foods in your diet.   I have ordered the following medication/changed the following medications:   Stop the following medications: Medications Discontinued During This Encounter  Medication Reason   Cholecalciferol (VITAMIN D) 50 MCG (2000 UT) tablet      Start the following medications: Meds ordered this encounter  Medications   Vitamin D, Cholecalciferol, 25 MCG (1000 UT) CAPS    Sig: Take 1 capsule by mouth daily.    Dispense:  90 capsule    Refill:  3     Follow up:  6 months with Dr. Sol Blazing, 4 weeks with nurse for BP check  Remember:   We look forward to seeing you next time. Please call our clinic at [phone] if you have any questions or concerns. The best time to call is Monday-Friday from 9am-4pm, but there is someone available 24/7. If after hours or the weekend, call the main hospital number and ask for the Internal Medicine Resident On-Call. If you need medication refills, please notify your pharmacy one week in advance and they will send [place] a request.   Thank you for trusting me with your care. Wishing you the best!   [facility]

## 2023-07-03 NOTE — Assessment & Plan Note (Signed)
Repeat DEXA consistent with known osteopenia, no significant change since last evaluated in 2019. FRAX score does not indicate need for treatment at this time. Continue with calcium-rich foods and exercise. Vitamin D 1000 international units daily.

## 2023-07-03 NOTE — Assessment & Plan Note (Addendum)
Small, flesh-colored papule over left posterior thigh that looks consistent with blocked hair follicle. We discussed that this appears benign and does not appear infected today. It is asymptomatic and not bothersome. I recommend warm compresses to see if this may affect the area but encouraged her to not pick at or lance the area herself to avoid infection. She will return if noticing significant growth or symptoms.

## 2023-07-03 NOTE — Assessment & Plan Note (Signed)
PHQ9 improved from 11 > 0 today. Mia Underwood is doing well and continues to follow with psychiatry, Dr. Alisia Ferrari. She is continuing to work on stressors, including family relationships.

## 2023-07-03 NOTE — Progress Notes (Signed)
The care of the patient was discussed with Dr. Sol Blazing and the assessment and plan was formulated with their assistance.  Please see their note for official documentation of the patient encounter.   Subjective:   Patient ID: Mia Underwood female   DOB: 1951/02/02 72 y.o.   MRN: [id number]  HPI: Mia Underwood is a 72 y.o. female with PMH of hyperlipidemia, hypertension, and osteopenia presenting to clinic for routine follow up. Similar to last clinic visit, she has complaints about leg cramping and a skin lesion. Please see problem based assessment and plan for details of the visit.    Past Medical History:  Diagnosis Date   Allergic rhinitis 05/04/2016   Arthritis    Bilateral carpal tunnel syndrome 10/18/2010   Left > Right    Colles' fracture of left radius 07/01/2019   Diverticulosis of sigmoid colon 06/07/2010   Essential hypertension 09/14/2009   Healthcare maintenance 01/03/2016   Hyperlipidemia 09/14/2009   Insomnia secondary to depression with anxiety 05/04/2016   Major depression, recurrent, chronic (HCC) 09/14/2009   Marijuana abuse 04/04/2016   Muscle spasm of left shoulder 02/03/2019   Tobacco abuse 01/17/2016   Upper respiratory infection, viral 02/03/2019   Vitamin D deficiency 04/04/2016   Current Outpatient Medications  Medication Sig Dispense Refill   Vitamin D, Cholecalciferol, 25 MCG (1000 UT) CAPS Take 1 capsule by mouth daily. 90 capsule 3   amLODipine-olmesartan (AZOR) 10-40 MG tablet Take 1 tablet by mouth daily. 90 tablet 3   cetirizine (ZYRTEC) 10 MG tablet Take 1 tablet (10 mg total) by mouth daily. 90 tablet 3   cyanocobalamin (VITAMIN B12) 1000 MCG tablet Take 1 tablet (1,000 mcg total) by mouth daily. 90 tablet 3   diclofenac Sodium (VOLTAREN) 1 % GEL Apply 4 g topically 4 (four) times daily. 150 g 3   gabapentin (NEURONTIN) 600 MG tablet Take 1 tablet (600 mg total) by mouth daily. 90 tablet 3   hydrochlorothiazide (HYDRODIURIL) 25 MG tablet TAKE  1 TABLET(25 MG) BY MOUTH DAILY 90 tablet 3   hydrOXYzine (VISTARIL) 25 MG capsule 1  qday  prn 30 capsule 4   rosuvastatin (CRESTOR) 20 MG tablet Take 1 tablet (20 mg total) by mouth daily. 90 tablet 3   sertraline (ZOLOFT) 100 MG tablet 1  qam 60 tablet 5   traZODone (DESYREL) 50 MG tablet Take 2 tablets (100 mg total) by mouth at bedtime. 60 tablet 3   vitamin E 180 MG (400 UNITS) capsule Take 400 Units by mouth daily.     No current facility-administered medications for this visit.   Family History  Problem Relation Age of Onset   Breast cancer Mother    Depression Mother    Hypertension Mother    Dementia Mother    Alcoholism Father        Died in his 81's related to alcohol abuse   Leukemia Sister 8   Early death Brother        Died in a motor vehicle accident   Heart attack Daughter 17       Per patient report, specifics unknown   Diabetes Mellitus II Maternal Grandmother    Diabetes Mellitus II Paternal Grandmother    Healthy Sister    Healthy Sister    Arthritis Brother    Colon polyps Brother    Healthy Brother    Healthy Brother    Healthy Son    Breast cancer Maternal Aunt    Breast cancer Cousin  Breast cancer Maternal Aunt    Colon cancer Neg Hx    Esophageal cancer Neg Hx    Rectal cancer Neg Hx    Stomach cancer Neg Hx    Social History   Socioeconomic History   Marital status: Single    Spouse name: Not on file   Number of children: 2   Years of education: 12 grade   Highest education level: Not on file  Occupational History   Occupation: Research officer, 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[employer]    Comment: Retired  Tobacco Use   Smoking status: Former    Current packs/day: 0.00    Average packs/day: 0.2 packs/day for 48.7 years (9.7 ttl pk-yrs)    Types: Cigarettes    Start date: 12/02/1973    Quit date: 07/2022    Years since quitting: 0.9   Smokeless tobacco: Never  Vaping Use   Vaping status: Never Used  Substance and Sexual Activity   Alcohol use: Yes     Alcohol/week: 2.0 standard drinks of alcohol    Types: 1 Glasses of wine, 1 Cans of beer per week    Comment: drinks socially with friends   Drug use: No   Sexual activity: Not Currently  Other Topics Concern   Not on file  Social History Narrative   Graduated from [employer] in [place].  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      Current Social History 03/01/2021   Patient lives with granddaughter in an apartment on the first floor. There are not steps up to the entrance the patient uses.       Patient's method of transportation is personal car.      The highest level of education was some college.      The patient currently retired.      Identified important Relationships are "My brother, my son"       Pets : None, but I would like to have one       Interests / Fun: Love to read, play Copywriter, advertising, Control and instrumentation engineer with sisters       Current Stressors: My community is getting violent, strained family relationships      Religious / Personal Beliefs: Baptized [employer]       Social Determinants of Health   Financial Resource Strain: Medium Risk (02/12/2023)   Overall Financial Resource Strain (CARDIA)    Difficulty of Paying Living Expenses: Somewhat hard  Food Insecurity: Food Insecurity Present (02/12/2023)   Hunger Vital Sign    Worried About Running Out of Food in the Last Year: Sometimes true    Ran Out of Food in the Last Year: Sometimes true  Transportation Needs: No Transportation Needs (02/12/2023)   PRAPARE - Administrator, Civil Service (Medical): No    Lack of Transportation (Non-Medical): No  Physical Activity: Inactive (02/12/2023)   Exercise Vital Sign    Days of Exercise per Week: 0 days    Minutes of Exercise per Session: 0 min  Stress: Stress Concern Present (02/12/2023)   [employer] of Occupational Health - Occupational Stress Questionnaire    Feeling of Stress : Very much  Social Connections: Socially  Isolated (02/12/2023)   Social Connection and Isolation Panel [NHANES]    Frequency of Communication with Friends and Family: Once a week    Frequency of Social Gatherings with Friends and Family: Never    Attends Religious Services: Never    Active  Member of Clubs or Organizations: No    Attends Banker Meetings: Never    Marital Status: Divorced   Review of Systems: ROS negative except for what is noted on the assessment and plan.  Objective:  Physical Exam: Vitals:   07/03/23 0855 07/03/23 0919  BP: (!) 144/84 (!) 155/88  Pulse: 73 68  Temp: 99.8 F (37.7 C)   TempSrc: Oral   SpO2: 97%   Weight: 155 lb 6.4 oz (70.5 kg)   Height: 5\' 1"  (1.549 m)     General appearance: well-appearing; pleasant  Eyes: tracking appropriately Extremities: No peripheral edema, radial and DP pulses present bilaterally  Skin: Normal temperature, turgor and texture; no rash; 1 cm skin-colored papule on left posterior thigh, no drainage, surrounding erythema, or TTP; small scab on left posterior knee Psych: Appropriate affect Neuro: Alert and oriented to person and place  Assessment & Plan:  Leg cramping Mia Underwood still has left lower leg cramping which is unpredictable to her. She was worried about electrolyte abnormalities and wanted to take potassium tablets. We discussed that given her potassium levels have been normal, I do not recommend potassium supplementation but she may try increasing her intake of healthy, potassium-containing foods to see if this helps her symptoms. She reports she does not drink enough water. She was a former smoker but no longer smoking. There was no edema, erythema or any lesions on the leg. Her last labs on 06/11/23 were unremarkable including potassium, calcium and magnesium.   Plan - Stretch calves before her exercises - increase water intake to 6-8 glasses of water    Vitamin D deficiency Mia Underwood has osteopenia based on her recent DEXA scan. She  denied any recent falls. She reports she goes to the gym 3x a week however does not use any weights.  Plan  - Start taking Vitamin 1000 UT - Try weight bearing exercises  Essential hypertension Blood pressure today in clinic was 155/88 when repeated. She occasionally checks blood pressure at home. She denies any SOB, headaches, dizziness or chest pains. She missed her medications (amlodipine-olmesartan (10-40 mg and hydrochlorothiazide 25mg ) this morning which could be causing a high reading. Her past blood pressures have been well controlled so holding off on any blood pressure medication changes.   Plan  - Continue amlodipine and hydrochlorothiazide  - f/u in 4 weeks for a nurse visit for blood pressure check  - f/u on 6 months with Dr. Sol Blazing  Osteopenia Repeat DEXA consistent with known osteopenia, no significant change since last evaluated in 2019. FRAX score does not indicate need for treatment at this time. Continue with calcium-rich foods and exercise. Vitamin D 1000 international units daily.  Skin lesion Small, flesh-colored papule over left posterior thigh that looks consistent with blocked hair follicle. We discussed that this appears benign and does not appear infected today. It is asymptomatic and not bothersome. I recommend warm compresses to see if this may affect the area but encouraged her to not pick at or lance the area herself to avoid infection. She will return if noticing significant growth or symptoms.  Moderately severe recurrent major depression (HCC) PHQ9 improved from 11 > 0 today. Mia Underwood is doing well and continues to follow with psychiatry, Dr. Alisia Ferrari. She is continuing to work on stressors, including family relationships.   Prediabetes Prediabetes with A1c of 6.1% diagnosed at last visit. Continue healthy lifestyle measures including diet and exercise which were discussed today.  Patient discussed with Dr.  Sol Blazing

## 2023-07-03 NOTE — Progress Notes (Signed)
Attestation for Student Documentation:  I personally was present and re-performed the history, physical exam and medical decision-making activities of this service and have verified that the service and findings are accurately documented in the student's note.  Dickie La, MD 07/03/2023, 1:31 PM

## 2023-07-03 NOTE — Assessment & Plan Note (Signed)
Mia Underwood has osteopenia based on her recent DEXA scan. She denied any recent falls. She reports she goes to the gym 3x a week however does not use any weights.  Plan  - Start taking Vitamin 1000 UT - Try weight bearing exercises

## 2023-07-03 NOTE — Assessment & Plan Note (Signed)
Blood pressure today in clinic was 155/88 when repeated. She occasionally checks blood pressure at home. She denies any SOB, headaches, dizziness or chest pains. She missed her medications (amlodipine-olmesartan (10-40 mg and hydrochlorothiazide 25mg ) this morning which could be causing a high reading. Her past blood pressures have been well controlled so holding off on any blood pressure medication changes.   Plan  - Continue amlodipine and hydrochlorothiazide  - f/u in 4 weeks for a nurse visit for blood pressure check  - f/u on 6 months with Dr. Sol Blazing

## 2023-07-03 NOTE — Assessment & Plan Note (Addendum)
Mia Underwood still has left lower leg cramping which is unpredictable to her. She was worried about electrolyte abnormalities and wanted to take potassium tablets. We discussed that given her potassium levels have been normal, I do not recommend potassium supplementation but she may try increasing her intake of healthy, potassium-containing foods to see if this helps her symptoms. She reports she does not drink enough water. She was a former smoker but no longer smoking. There was no edema, erythema or any lesions on the leg. Her last labs on 06/11/23 were unremarkable including potassium, calcium and magnesium.   Plan - Stretch calves before her exercises - increase water intake to 6-8 glasses of water

## 2023-07-13 ENCOUNTER — Other Ambulatory Visit (HOSPITAL_COMMUNITY): Payer: Self-pay | Admitting: Psychiatry

## 2023-07-13 ENCOUNTER — Other Ambulatory Visit: Payer: Self-pay | Admitting: Internal Medicine

## 2023-07-13 DIAGNOSIS — I10 Essential (primary) hypertension: Secondary | ICD-10-CM

## 2023-07-16 ENCOUNTER — Encounter: Payer: Self-pay | Admitting: Pharmacist

## 2023-07-16 NOTE — Progress Notes (Signed)
Adherence check-in. Appears adherent to rosuvastatin based on 90 day fill on 07/13/23 and amlodipine-olmesartan based on 90 day fill on 05/30/23.

## 2023-07-31 ENCOUNTER — Ambulatory Visit: Payer: Medicare PPO | Admitting: *Deleted

## 2023-07-31 VITALS — BP 143/66 | HR 71

## 2023-07-31 DIAGNOSIS — Z23 Encounter for immunization: Secondary | ICD-10-CM

## 2023-07-31 NOTE — Progress Notes (Signed)
    [name] presented today for blood pressure check. Patient is prescribed blood pressure medications and I confirmed that patient did take their blood pressure medication prior to today's appointment. Blood pressure was taken in the usual and appropriate manner using an automated BP cuff.     Vitals:   07/31/23 1035 07/31/23 1103  BP: 139/74 (!) 143/66      Results of today's visit will be routed to Dr. Sol Blazing for review and further management.

## 2023-08-13 ENCOUNTER — Ambulatory Visit (INDEPENDENT_AMBULATORY_CARE_PROVIDER_SITE_OTHER): Payer: Medicare PPO | Admitting: Psychology

## 2023-08-13 DIAGNOSIS — F325 Major depressive disorder, single episode, in full remission: Secondary | ICD-10-CM | POA: Diagnosis not present

## 2023-08-13 NOTE — Progress Notes (Signed)
[facility] Counselor/Therapist Progress Note  Patient ID: Mia Underwood, MRN: [id number]   Date: 08/13/23  Time Spent: 11:06 am - 11:57   am : 51 Minutes  Treatment Type: Individual Therapy.  Reported Symptoms: depression, anxiety, and interpersonal stressors  Mental Status Exam: Appearance:  Casual     Behavior: Appropriate  Motor: Normal  Speech/Language:  Clear and Coherent  Affect: Appropriate  Mood: dysthymic  Thought process: normal  Thought content:   WNL  Sensory/Perceptual disturbances:   WNL  Orientation: oriented to person, place, time/date, and situation  Attention: Good  Concentration: Good  Memory: WNL  Fund of knowledge:  Good  Insight:   Good  Judgment:  Good  Impulse Control: Good   Risk Assessment: Danger to Self:  No Self-injurious Behavior: No Danger to Others: No Duty to Warn:no Physical Aggression / Violence:No  Access to Firearms a concern: No  Gang Involvement:No   Subjective:   Mia Underwood participated from the office consented to treatment with the therapist. Mia Underwood discussed the events of the past two weeks. She noted feeling like she's been in a "slump" and noted this affecting her socializing. She noted at times she feels varying levels of comfort when socializing.  She noted often having difficulty with ADLs, socializing, and engaging day-to-day. She noted going to the gym to get out of the house. She noted, outside of this, playing crosswords and eating junk food. She also noted feeling agitated more frequently. Mia Underwood noted her psychiatric appointment being in December and that she takes her Sertraline consistently but does not taking the traZODone nor the hydrOXYzine. She noted a need to develop a consistent sleep schedule. She noted taking "a lot of little naps". She noted this time of year is difficult due to her deceased daughter's birthday 09[birth year]10[birth year]20[birth year]. 23rd and noted this being difficult to talk about with others. She noted, when  taking her traZODone, she often feels groggy in the AM. She often noted watching TV and noted staying up to watching movies in lieu of sleep. Therapist encouraged Mia Underwood to contact her prescriber, Dr. Donell Beers, to discuss her concerns and make changes to the medication. Additionally, we discussed the importance of maintaining positive sleep hygiene and a handout was provided during the session for reference and review. Mia Underwood was engaged and motivated during the session. She expressed commitment towards goals. A follow-up was scheduled for continued treatment, which she benefits from.   Interventions: CBT & interpersonal  Diagnosis:  Major depressive disorder with single episode, in full remission [facility])   Treatment Plan:  Client Abilities/Strengths Mia Underwood is forthcoming and motivated for change.   Support System: Family and friends.   Client Treatment Preferences Outpatient therapy  Client Statement of Needs Mia Underwood would like to manage mood, reduce frustration tolerance, manage irritability, process passed events, build and maintain boundaries, and manage current interpersonal stressors.    Treatment Level Weekly  Symptoms  Anxiety: feeling nervous, difficulty managing worry, worrying about different things, trouble relaxing, restlessness, irritability, feeling afraid something awful might happen   (Status: maintained)  Depression: loss of interest, feeling down, trouble sleeping, lethargy, overeating, feeling bad about self, difficulty concentrating, psychomotor agitation   (Status: maintained)  Goals:   Mia Underwood experiences symptoms of  depression and anxiety   Target Date: 10/10/23 Frequency: Weekly  Progress: 0 Modality: individual    Therapist will provide referrals for additional resources as appropriate.  Therapist will provide psycho-education regarding Mia Underwood's diagnosis and corresponding treatment approaches and interventions. Licensed Clinical  Social Worker, Mia Ovens,  LCSW will support the patient's ability to achieve the goals identified. will employ CBT, BA, Problem-solving, Solution Focused, Mindfulness,  coping skills, & other evidenced-based practices will be used to promote progress towards healthy functioning to help manage decrease symptoms associated with his diagnosis.   Reduce overall level, frequency, and intensity of the feelings of depression, anxiety and panic evidenced by decreased overall symptoms from 6 to 7 days/week to 0 to 1 days/week per client report for at least 3 consecutive months. Verbally express understanding of the relationship between feelings of depression, anxiety and their impact on thinking patterns and behaviors. Verbalize an understanding of the role that distorted thinking plays in creating fears, excessive worry, and ruminations.    Mia Underwood participated in the creation of the treatment plan)   Mia Ovens, LCSW

## 2023-08-27 ENCOUNTER — Ambulatory Visit (INDEPENDENT_AMBULATORY_CARE_PROVIDER_SITE_OTHER): Payer: Medicare PPO | Admitting: Psychology

## 2023-08-27 DIAGNOSIS — F324 Major depressive disorder, single episode, in partial remission: Secondary | ICD-10-CM | POA: Diagnosis not present

## 2023-08-27 NOTE — Progress Notes (Signed)
[facility] Counselor/Therapist Progress Note  Patient ID: Mia Underwood, MRN: [id number]   Date: 08/27/23  Time Spent: 12:00 pm - 12:39  pm : 39 Minutes  Treatment Type: Individual Therapy.  Reported Symptoms: depression, anxiety, and interpersonal stressors  Mental Status Exam: Appearance:  Casual     Behavior: Appropriate  Motor: Normal  Speech/Language:  Clear and Coherent  Affect: Appropriate  Mood: dysthymic  Thought process: normal  Thought content:   WNL  Sensory/Perceptual disturbances:   WNL  Orientation: oriented to person, place, time/date, and situation  Attention: Good  Concentration: Good  Memory: WNL  Fund of knowledge:  Good  Insight:   Good  Judgment:  Good  Impulse Control: Good   Risk Assessment: Danger to Self:  No Self-injurious Behavior: No Danger to Others: No Duty to Warn:no Physical Aggression / Violence:No  Access to Firearms a concern: No  Gang Involvement:No   Subjective:   Mia Underwood participated from the office consented to treatment with the therapist. Mia Underwood discussed the events of the past two weeks. Mia Underwood noted recently attending the funeral of a family friend. She noted an unexpected loss of a different acquaintances. She noted this being difficult for her to deal with. She noted working on engaging in enjoyable activities and noted her need to go to the gym after missing a week due to funerals. Therapist validated Mia Underwood's experience during the session. Mia Underwood noted worrying about how people might see, or think about, her. We explored this during the session and what might contribute to this perspective. She noted worry about her grand daughter and noted having to "build her up" much like Mia Underwood had to do for her own mother. Mia Underwood noted having to do this for herself as a child. We worked on processing this during the session.  Therapist praised Mia Underwood for her effort and vulnerability during the session. Therapist provided  supportive therapy and a follow-up was scheduled for continued treatment.   Interventions: CBT & interpersonal  Diagnosis:  Major depressive disorder with single episode, in partial remission [name])   Treatment Plan:  Client Abilities/Strengths Mia Underwood is forthcoming and motivated for change.   Support System: Family and friends.   Client Treatment Preferences Outpatient therapy  Client Statement of Needs Mia Underwood would like to manage mood, reduce frustration tolerance, manage irritability, process passed events, build and maintain boundaries, and manage current interpersonal stressors.    Treatment Level Weekly  Symptoms  Anxiety: feeling nervous, difficulty managing worry, worrying about different things, trouble relaxing, restlessness, irritability, feeling afraid something awful might happen   (Status: maintained)  Depression: loss of interest, feeling down, trouble sleeping, lethargy, overeating, feeling bad about self, difficulty concentrating, psychomotor agitation   (Status: maintained)  Goals:   Mia Underwood experiences symptoms of  depression and anxiety   Target Date: 10/10/23 Frequency: Weekly  Progress: 0 Modality: individual    Therapist will provide referrals for additional resources as appropriate.  Therapist will provide psycho-education regarding Mia Underwood's diagnosis and corresponding treatment approaches and interventions. Licensed Clinical Social Worker, [place], LCSW will support the patient's ability to achieve the goals identified. will employ CBT, BA, Problem-solving, Solution Focused, Mindfulness,  coping skills, & other evidenced-based practices will be used to promote progress towards healthy functioning to help manage decrease symptoms associated with his diagnosis.   Reduce overall level, frequency, and intensity of the feelings of depression, anxiety and panic evidenced by decreased overall symptoms from 6 to 7 days/week to 0 to 1 days/week per  client  report for at least 3 consecutive months. Verbally express understanding of the relationship between feelings of depression, anxiety and their impact on thinking patterns and behaviors. Verbalize an understanding of the role that distorted thinking plays in creating fears, excessive worry, and ruminations.    Mia Underwood participated in the creation of the treatment plan)   Mia Ovens, LCSW

## 2023-09-03 ENCOUNTER — Ambulatory Visit (INDEPENDENT_AMBULATORY_CARE_PROVIDER_SITE_OTHER): Payer: Medicare PPO | Admitting: Psychology

## 2023-09-03 DIAGNOSIS — F324 Major depressive disorder, single episode, in partial remission: Secondary | ICD-10-CM | POA: Diagnosis not present

## 2023-09-03 NOTE — Progress Notes (Unsigned)
[facility] Counselor/Therapist Progress Note  Patient ID: Mia Underwood, MRN: [id number]   Date: 09/03/23  Time Spent: 11:41 am -  12:29 pm : 48 Minutes  Treatment Type: Individual Therapy.  Reported Symptoms: depression, anxiety, and interpersonal stressors  Mental Status Exam: Appearance:  Casual     Behavior: Appropriate  Motor: Normal  Speech/Language:  Clear and Coherent  Affect: Appropriate  Mood: dysthymic  Thought process: normal  Thought content:   WNL  Sensory/Perceptual disturbances:   WNL  Orientation: oriented to person, place, time/date, and situation  Attention: Good  Concentration: Good  Memory: WNL  Fund of knowledge:  Good  Insight:   Good  Judgment:  Good  Impulse Control: Good   Risk Assessment: Danger to Self:  No Self-injurious Behavior: No Danger to Others: No Duty to Warn:no Physical Aggression / Violence:No  Access to Firearms a concern: No  Gang Involvement:No   Subjective:   Mia Underwood participated from her home, via video, consented to treatment. Therapist participated from office. Ashby discussed the events of the past two weeks. Mia Underwood noted worry about a family member not following the necessary steps to switch insurance and noted her frustration regarding this. We worked on processing this during the session. She noted reviewing the sleep hygiene handout between sessions and has been working on making changes to her sleep routine. She noted often getting side-tracked while attempting to complete a task. She noted her efforts to manage the areas that she has say in while not focusing on areas of not control. She noted her family often calling her to provide support in areas that she has no control. She noted attempts to be supportive, when she is able to. She noted often relying on smoking as a coping mechanism in the past. She noted her efforts to do things for her mother due to her own mother's childhood of being unloved.  She noted this being ineffective in having her mother treat her more lovingly. We worked on processing this during the session.  Therapist praised Mia Underwood for her effort an disclosure during the session. Therapist provided supportive therapy and a follow-up was scheduled for continued treatment which she benefits from.   Interventions: CBT & interpersonal  Diagnosis:  Major depressive disorder with single episode, in partial remission [facility])   Treatment Plan:  Client Abilities/Strengths Mia Underwood is forthcoming and motivated for change.   Support System: Family and friends.   Client Treatment Preferences Outpatient therapy  Client Statement of Needs Madylyn would like to manage mood, reduce frustration tolerance, manage irritability, process passed events, build and maintain boundaries, and manage current interpersonal stressors.    Treatment Level Weekly  Symptoms  Anxiety: feeling nervous, difficulty managing worry, worrying about different things, trouble relaxing, restlessness, irritability, feeling afraid something awful might happen   (Status: maintained)  Depression: loss of interest, feeling down, trouble sleeping, lethargy, overeating, feeling bad about self, difficulty concentrating, psychomotor agitation   (Status: maintained)  Goals:   Amanat experiences symptoms of  depression and anxiety   Target Date: 10/10/23 Frequency: Weekly  Progress: 0 Modality: individual    Therapist will provide referrals for additional resources as appropriate.  Therapist will provide psycho-education regarding Reign's diagnosis and corresponding treatment approaches and interventions. Licensed Clinical Social Worker, [place], LCSW will support the patient's ability to achieve the goals identified. will employ CBT, BA, Problem-solving, Solution Focused, Mindfulness,  coping skills, & other evidenced-based practices will be used to promote progress towards healthy functioning  to help  manage decrease symptoms associated with his diagnosis.   Reduce overall level, frequency, and intensity of the feelings of depression, anxiety and panic evidenced by decreased overall symptoms from 6 to 7 days/week to 0 to 1 days/week per client report for at least 3 consecutive months. Verbally express understanding of the relationship between feelings of depression, anxiety and their impact on thinking patterns and behaviors. Verbalize an understanding of the role that distorted thinking plays in creating fears, excessive worry, and ruminations.    Mia Underwood participated in the creation of the treatment plan)   [name], LCSW

## 2023-09-10 ENCOUNTER — Ambulatory Visit (INDEPENDENT_AMBULATORY_CARE_PROVIDER_SITE_OTHER): Payer: Medicare PPO | Admitting: Psychology

## 2023-09-10 DIAGNOSIS — F324 Major depressive disorder, single episode, in partial remission: Secondary | ICD-10-CM

## 2023-09-10 NOTE — Progress Notes (Signed)
[facility] Counselor/Therapist Progress Note  Patient ID: Mia Underwood, MRN: [id number]   Date: 09/10/23  Time Spent: 12:08 pm -  12:56 pm : 48 Minutes  Treatment Type: Individual Therapy.  Reported Symptoms: depression, anxiety, and interpersonal stressors  Mental Status Exam: Appearance:  Casual     Behavior: Appropriate  Motor: Normal  Speech/Language:  Clear and Coherent  Affect: Appropriate  Mood: dysthymic  Thought process: normal  Thought content:   WNL  Sensory/Perceptual disturbances:   WNL  Orientation: oriented to person, place, time/date, and situation  Attention: Good  Concentration: Good  Memory: WNL  Fund of knowledge:  Good  Insight:   Good  Judgment:  Good  Impulse Control: Good   Risk Assessment: Danger to Self:  No Self-injurious Behavior: No Danger to Others: No Duty to Warn:no Physical Aggression / Violence:No  Access to Firearms a concern: No  Gang Involvement:No   Subjective:   Mia Underwood participated from her home, via video, consented to treatment. Therapist participated from office. Mia Underwood discussed the events of the past two weeks. Mia Underwood noted her effort to keep motivated, keep busy, and engage in enjoyable activities. She noted a lack of emotional support and discussed that people around her aren't aware of her depression and difficulty completing ADLS. She noted experiencing many losses. She noted her efforts to manage her frustration towards her sister and not engage negatively despite her feelings being hurt and feeling "really disappointed". She noted "here lately feeling agitated for no reason at all". We worked on exploring the cause of her agitation. Mia Underwood noted a history of this, in the past, which we began processing. She noted lack of companionship and the lack of alignment in relationship expectations being a factor, as well. We worked on identifying ways to be more social. She noted her level of irritation is often  related to interpersonal stressors. We will continue to process this going forward. Therapist praised Mia Underwood for her effort to be vulnerable during the session. Therapist provided supportive therapy and a follow-up was scheduled for continued treatment which she benefits from.   Interventions: CBT & interpersonal  Diagnosis:  Major depressive disorder with single episode, in partial remission [facility])   Treatment Plan:  Client Abilities/Strengths Mia Underwood is forthcoming and motivated for change.   Support System: Family and friends.   Client Treatment Preferences Outpatient therapy  Client Statement of Needs Mia Underwood would like to manage mood, reduce frustration tolerance, manage irritability, process passed events, build and maintain boundaries, and manage current interpersonal stressors.    Treatment Level Weekly  Symptoms  Anxiety: feeling nervous, difficulty managing worry, worrying about different things, trouble relaxing, restlessness, irritability, feeling afraid something awful might happen   (Status: maintained)  Depression: loss of interest, feeling down, trouble sleeping, lethargy, overeating, feeling bad about self, difficulty concentrating, psychomotor agitation   (Status: maintained)  Goals:   Mia Underwood experiences symptoms of  depression and anxiety   Target Date: 10/10/23 Frequency: Weekly  Progress: 0 Modality: individual    Therapist will provide referrals for additional resources as appropriate.  Therapist will provide psycho-education regarding Mia Underwood's diagnosis and corresponding treatment approaches and interventions. Licensed Clinical Social Worker, [place], LCSW will support the patient's ability to achieve the goals identified. will employ CBT, BA, Problem-solving, Solution Focused, Mindfulness,  coping skills, & other evidenced-based practices will be used to promote progress towards healthy functioning to help manage decrease symptoms associated with his  diagnosis.   Reduce overall level, frequency, and  intensity of the feelings of depression, anxiety and panic evidenced by decreased overall symptoms from 6 to 7 days/week to 0 to 1 days/week per client report for at least 3 consecutive months. Verbally express understanding of the relationship between feelings of depression, anxiety and their impact on thinking patterns and behaviors. Verbalize an understanding of the role that distorted thinking plays in creating fears, excessive worry, and ruminations.    Mia Underwood participated in the creation of the treatment plan)   [name], LCSW

## 2023-09-17 ENCOUNTER — Ambulatory Visit: Payer: Medicare PPO | Admitting: Psychology

## 2023-10-01 ENCOUNTER — Ambulatory Visit (INDEPENDENT_AMBULATORY_CARE_PROVIDER_SITE_OTHER): Payer: Medicare PPO | Admitting: Psychology

## 2023-10-01 DIAGNOSIS — F324 Major depressive disorder, single episode, in partial remission: Secondary | ICD-10-CM

## 2023-10-01 NOTE — Progress Notes (Signed)
[facility] Counselor/Therapist Progress Note  Patient ID: Mia Underwood, MRN: [id number]   Date: 10/01/23  Time Spent: 1:37 pm -  2:31 pm : 54 Minutes  Treatment Type: Individual Therapy.  Reported Symptoms: depression, anxiety, and interpersonal stressors  Mental Status Exam: Appearance:  Casual     Behavior: Appropriate  Motor: Normal  Speech/Language:  Clear and Coherent  Affect: Appropriate  Mood: dysthymic  Thought process: normal  Thought content:   WNL  Sensory/Perceptual disturbances:   WNL  Orientation: oriented to person, place, time/date, and situation  Attention: Good  Concentration: Good  Memory: WNL  Fund of knowledge:  Good  Insight:   Good  Judgment:  Good  Impulse Control: Good   Risk Assessment: Danger to Self:  No Self-injurious Behavior: No Danger to Others: No Duty to Warn:no Physical Aggression / Violence:No  Access to Firearms a concern: No  Gang Involvement:No   Subjective:   Mia Underwood participated from her home, via video, consented to treatment. Therapist participated from office. Mia Underwood discussed the events of the past two weeks. Mia Underwood noted feeling stressed about the upcoming presidential election and noted  her frustration regarding this, as a whole. She noted being overwhelmed by the political process. We worked on processing this during the session. She noted an increase in her interpersonal stressors with her neighbor. We worked on exploring and processing this during the session. She noted interpersonal stressors with others and noted wanting to be left alone. She noted interest in identifying ways to address her stressors and noted wondering "how guys would deal with this". We explored this during the session. Therapist highlighted that Mia Underwood's avoidance of stressful relationships could be playing a part in not engaging in activities that could include those who affect her negatively. Therapist encouraged Mia Underwood to identify  boundaries for others that would allow her to engage in enjoyable activities that could affect her mood positive.She noted feeling "angry" and "needing to get that out". We will work on exploring this going forward and her sense of guilt when she does not engage with those who create stress in her life. She noted a need to develop a sense of self and "not worry" about others.Mia Underwood was engaged and motivated during the session. Therapist praised Devika for her effort today and validated her experience. Therapist provided supportive therapy. A follow-up was scheduled for continued treatment.   Interventions: CBT & interpersonal  Diagnosis:  Major depressive disorder with single episode, in partial remission [facility])   Treatment Plan:  Client Abilities/Strengths Mia Underwood is forthcoming and motivated for change.   Support System: Family and friends.   Client Treatment Preferences Outpatient therapy  Client Statement of Needs Mia Underwood would like to manage mood, reduce frustration tolerance, manage irritability, process passed events, build and maintain boundaries, and manage current interpersonal stressors.    Treatment Level Weekly  Symptoms  Anxiety: feeling nervous, difficulty managing worry, worrying about different things, trouble relaxing, restlessness, irritability, feeling afraid something awful might happen   (Status: maintained)  Depression: loss of interest, feeling down, trouble sleeping, lethargy, overeating, feeling bad about self, difficulty concentrating, psychomotor agitation   (Status: maintained)  Goals:   Mia Underwood experiences symptoms of  depression and anxiety   Target Date: 10/10/23 Frequency: Weekly  Progress: 0 Modality: individual    Therapist will provide referrals for additional resources as appropriate.  Therapist will provide psycho-education regarding Mia Underwood's diagnosis and corresponding treatment approaches and interventions. Licensed Clinical Social Worker,  [place], [place]  will support the patient's ability to achieve the goals identified. will employ CBT, BA, Problem-solving, Solution Focused, Mindfulness,  coping skills, & other evidenced-based practices will be used to promote progress towards healthy functioning to help manage decrease symptoms associated with his diagnosis.   Reduce overall level, frequency, and intensity of the feelings of depression, anxiety and panic evidenced by decreased overall symptoms from 6 to 7 days/week to 0 to 1 days/week per client report for at least 3 consecutive months. Verbally express understanding of the relationship between feelings of depression, anxiety and their impact on thinking patterns and behaviors. Verbalize an understanding of the role that distorted thinking plays in creating fears, excessive worry, and ruminations.    Mia Underwood participated in the creation of the treatment plan)   Mia Ovens, LCSW

## 2023-10-11 ENCOUNTER — Other Ambulatory Visit (HOSPITAL_COMMUNITY): Payer: Self-pay | Admitting: Psychiatry

## 2023-10-15 ENCOUNTER — Ambulatory Visit (INDEPENDENT_AMBULATORY_CARE_PROVIDER_SITE_OTHER): Payer: Medicare PPO | Admitting: Psychology

## 2023-10-15 DIAGNOSIS — F324 Major depressive disorder, single episode, in partial remission: Secondary | ICD-10-CM

## 2023-10-15 NOTE — Progress Notes (Signed)
[facility] Counselor/Therapist Progress Note  Patient ID: EDLYN ESTIME, MRN: [id number]   Date: 10/15/23  Time Spent: 11:36 am -  12:29 pm   53 Minutes  Treatment Type: Individual Therapy.  Reported Symptoms: depression, anxiety, and interpersonal stressors  Mental Status Exam: Appearance:  Casual     Behavior: Appropriate  Motor: Normal  Speech/Language:  Clear and Coherent  Affect: Appropriate  Mood: dysthymic  Thought process: normal  Thought content:   WNL  Sensory/Perceptual disturbances:   WNL  Orientation: oriented to person, place, time/date, and situation  Attention: Good  Concentration: Good  Memory: WNL  Fund of knowledge:  Good  Insight:   Good  Judgment:  Good  Impulse Control: Good   Risk Assessment: Danger to Self:  No Self-injurious Behavior: No Danger to Others: No Duty to Warn:no Physical Aggression / Violence:No  Access to Firearms a concern: No  Gang Involvement:No   Subjective:   KENNIYAH FELLA participated from her home, via video, consented to treatment. Therapist participated from office. Jerika discussed the events of the past two weeks. She noted her effort to plan the upcoming holiday. She noted that the family, at times, had difficulty getting along. She noted her granddaughter being "so difficult". She noted her own frustration regarding her granddaughter's choices. She noted that her effort to find her grand-daughter a therapist she used to see and had good rapport with. She noted that her granddaughter often displaces her frustration onto [place]. She noted setting boundaries, going forward, regarding this issue. We processed her boundaries. She noted that when upset, she (granddaughter) takes away access of the grandchildren. She noted a lack of positive interactions and pleasantries with her younger granddaughter. She noted continue grief for her daughter who passed due to cardiac issues. She was tearful during the session as she  discussed her daughter's passing. Therapist validated Kazue's feelings and experiences, praised Jaylianis for her effort to set boundaries, and provided supportive therapy. A follow-up was scheduled for continued treatment. Therapist provided supportive therapy.   Interventions: CBT & interpersonal  Diagnosis:  Major depressive disorder with single episode, in partial remission [facility])   Treatment Plan:  Client Abilities/Strengths Corneshia is forthcoming and motivated for change.   Support System: Family and friends.   Client Treatment Preferences Outpatient therapy  Client Statement of Needs Keeghan would like to manage mood, reduce frustration tolerance, manage irritability, process passed events, build and maintain boundaries, and manage current interpersonal stressors.    Treatment Level Weekly  Symptoms  Anxiety: feeling nervous, difficulty managing worry, worrying about different things, trouble relaxing, restlessness, irritability, feeling afraid something awful might happen   (Status: maintained)  Depression: loss of interest, feeling down, trouble sleeping, lethargy, overeating, feeling bad about self, difficulty concentrating, psychomotor agitation   (Status: maintained)  Goals:   Nazifa experiences symptoms of  depression and anxiety   Target Date: 11/09/23 Frequency: Weekly  Progress: 0 Modality: individual    Therapist will provide referrals for additional resources as appropriate.  Therapist will provide psycho-education regarding Genetta's diagnosis and corresponding treatment approaches and interventions. Licensed Clinical Social Worker, [place], LCSW will support the patient's ability to achieve the goals identified. will employ CBT, BA, Problem-solving, Solution Focused, Mindfulness,  coping skills, & other evidenced-based practices will be used to promote progress towards healthy functioning to help manage decrease symptoms associated with his diagnosis.   Reduce  overall level, frequency, and intensity of the feelings of depression, anxiety and panic evidenced by decreased  overall symptoms from 6 to 7 days/week to 0 to 1 days/week per client report for at least 3 consecutive months. Verbally express understanding of the relationship between feelings of depression, anxiety and their impact on thinking patterns and behaviors. Verbalize an understanding of the role that distorted thinking plays in creating fears, excessive worry, and ruminations.    [name] in the creation of the treatment plan)   [name], LCSW

## 2023-10-31 ENCOUNTER — Ambulatory Visit (HOSPITAL_COMMUNITY): Payer: Medicare PPO | Admitting: Psychiatry

## 2023-11-07 ENCOUNTER — Ambulatory Visit (HOSPITAL_COMMUNITY): Payer: Medicare PPO | Admitting: Psychiatry

## 2023-11-14 ENCOUNTER — Ambulatory Visit: Payer: Medicare PPO | Admitting: Psychology

## 2023-11-14 DIAGNOSIS — F324 Major depressive disorder, single episode, in partial remission: Secondary | ICD-10-CM | POA: Diagnosis not present

## 2023-11-14 NOTE — Progress Notes (Signed)
[facility] Counselor/Therapist Progress Note  Patient ID: EVELLYN HELBLING, MRN: [id number]   Date: 11/14/23  Time Spent: 2:12 pm -  2:36 pm   24 Minutes  Treatment Type: Individual Therapy.  Reported Symptoms: depression, anxiety, and interpersonal stressors  Mental Status Exam: Appearance:  Casual     Behavior: Appropriate  Motor: Normal  Speech/Language:  Clear and Coherent  Affect: Appropriate  Mood: dysthymic  Thought process: normal  Thought content:   WNL  Sensory/Perceptual disturbances:   WNL  Orientation: oriented to person, place, time/date, and situation  Attention: Good  Concentration: Good  Memory: WNL  Fund of knowledge:  Good  Insight:   Good  Judgment:  Good  Impulse Control: Good   Risk Assessment: Danger to Self:  No Self-injurious Behavior: No Danger to Others: No Duty to Warn:no Physical Aggression / Violence:No  Access to Firearms a concern: No  Gang Involvement:No   Subjective:   RAMIA MILLENDER participated from her home, via video, consented to treatment. Therapist participated from office. Tzivy discussed the events of the past two weeks. Alyne noted having a "nice thanksgiving" with family. She noted being hopeful to sign up for a gym membership with [employer] and noted a need to get help with the sign-up process to workout at home.She noted her efforts to eat healthier. She noted the gym workouts being stressful due to interpersonal issues. . She noted experiencing recent losses and noted this being depressing. She noted inviting her granddaughter to thanksgiving and not receiving any response. She noted being resolved that her granddaughter behaves in this way and noted not having control in this area. She noted having the same acceptance towards other family members who are inconsistent and "keep a lot of drama going". She noted not feeling well today and noted having a cold and developing a headache. Seh elected to end the  session early. We scheduled a follow-up during the session. Therapist validated Lashya's feelings and experience and provided supportive therapy.  Interventions: CBT & interpersonal  Diagnosis:  Major depressive disorder with single episode, in partial remission [facility])   Treatment Plan:  Client Abilities/Strengths Maydell is forthcoming and motivated for change.   Support System: Family and friends.   Client Treatment Preferences Outpatient therapy  Client Statement of Needs Iyiana would like to manage mood, reduce frustration tolerance, manage irritability, process passed events, build and maintain boundaries, and manage current interpersonal stressors.    Treatment Level Weekly  Symptoms  Anxiety: feeling nervous, difficulty managing worry, worrying about different things, trouble relaxing, restlessness, irritability, feeling afraid something awful might happen   (Status: maintained)  Depression: loss of interest, feeling down, trouble sleeping, lethargy, overeating, feeling bad about self, difficulty concentrating, psychomotor agitation   (Status: maintained)  Goals:   Sanniyah experiences symptoms of  depression and anxiety   Target Date: 12/20/23 Frequency: Weekly  Progress: 0 Modality: individual    Therapist will provide referrals for additional resources as appropriate.  Therapist will provide psycho-education regarding Minela's diagnosis and corresponding treatment approaches and interventions. Licensed Clinical Social Worker, [place], LCSW will support the patient's ability to achieve the goals identified. will employ CBT, BA, Problem-solving, Solution Focused, Mindfulness,  coping skills, & other evidenced-based practices will be used to promote progress towards healthy functioning to help manage decrease symptoms associated with his diagnosis.   Reduce overall level, frequency, and intensity of the feelings of depression, anxiety and panic evidenced by decreased  overall symptoms from 6 to 7  days/week to 0 to 1 days/week per client report for at least 3 consecutive months. Verbally express understanding of the relationship between feelings of depression, anxiety and their impact on thinking patterns and behaviors. Verbalize an understanding of the role that distorted thinking plays in creating fears, excessive worry, and ruminations.    [name] in the creation of the treatment plan)   [name], LCSW

## 2023-12-12 ENCOUNTER — Ambulatory Visit (HOSPITAL_COMMUNITY): Payer: Medicare PPO | Admitting: Psychiatry

## 2023-12-20 ENCOUNTER — Encounter: Payer: Medicare PPO | Admitting: Psychology

## 2023-12-20 NOTE — Progress Notes (Signed)
 This encounter was created in error - please disregard.

## 2024-01-03 ENCOUNTER — Ambulatory Visit: Payer: Medicare PPO | Admitting: Psychology

## 2024-01-03 DIAGNOSIS — F324 Major depressive disorder, single episode, in partial remission: Secondary | ICD-10-CM

## 2024-01-03 NOTE — Progress Notes (Signed)
 [facility] Counselor/Therapist Progress Note  Patient ID: Mia Underwood, MRN: [id number]   Date: 01/03/24  Time Spent: 8:09 am - 8:58 am   49 Minutes  Treatment Type: Individual Therapy.  Reported Symptoms: depression, anxiety, and interpersonal stressors  Mental Status Exam: Appearance:  Casual     Behavior: Appropriate  Motor: Normal  Speech/Language:  Clear and Coherent  Affect: Appropriate  Mood: dysthymic  Thought process: normal  Thought content:   WNL  Sensory/Perceptual disturbances:   WNL  Orientation: oriented to person, place, time/date, and situation  Attention: Good  Concentration: Good  Memory: WNL  Fund of knowledge:  Good  Insight:   Good  Judgment:  Good  Impulse Control: Good   Risk Assessment: Danger to Self:  No Self-injurious Behavior: No Danger to Others: No Duty to Warn:no Physical Aggression / Violence:No  Access to Firearms a concern: No  Gang Involvement:No   Subjective:   Mia Underwood participated from her home, via video, consented to treatment. Therapist participated from home office. Mia Underwood discussed the events of the past two weeks. She noted increased frustrations while driving due to other people's driving decisions. She noted her attempts to manage her frustrations and noted feeling shaken up. She noted beginning to feel like she doesn't want to leave the home. We worked on processing these experiences and noted experiencing a high level of distress. She noted using prayer as a coping mechanism along with positive self-talk. We worked on identifying ways to manage this distress going forward. Therapist encouraged Mia Underwood to be mindful of past driving experiences that were positive, work on putting these experiences in perspective with many dries that were not close calls, and monitoring her negative self-talk. Therapist encouraged Mia Underwood to work on challenging negative self-talk regarding driving. She noted a friend of hers  recently becoming ill and having a stroke. She noted worry about her friend but noted some improvements in her functioning since the stroke. Therapist validated Mia Underwood's feelings and experience during the session. A follow-up was scheduled for continued treatment.   Interventions: CBT & interpersonal  Diagnosis:  Major depressive disorder with single episode, in partial remission [facility])   Treatment Plan:  Client Abilities/Strengths Mia Underwood is forthcoming and motivated for change.   Support System: Family and friends.   Client Treatment Preferences Outpatient therapy  Client Statement of Needs Mia Underwood would like to manage mood, reduce frustration tolerance, manage irritability, process passed events, build and maintain boundaries, and manage current interpersonal stressors.    Treatment Level Weekly  Symptoms  Anxiety: feeling nervous, difficulty managing worry, worrying about different things, trouble relaxing, restlessness, irritability, feeling afraid something awful might happen   (Status: maintained)  Depression: loss of interest, feeling down, trouble sleeping, lethargy, overeating, feeling bad about self, difficulty concentrating, psychomotor agitation   (Status: maintained)  Goals:   Mia Underwood experiences symptoms of  depression and anxiety   Target Date: 01/20/24 Frequency: Weekly  Progress: 0 Modality: individual    Therapist will provide referrals for additional resources as appropriate.  Therapist will provide psycho-education regarding Mia Underwood's diagnosis and corresponding treatment approaches and interventions. Licensed Clinical Social Worker, [place], LCSW will support the patient's ability to achieve the goals identified. will employ CBT, BA, Problem-solving, Solution Focused, Mindfulness,  coping skills, & other evidenced-based practices will be used to promote progress towards healthy functioning to help manage decrease symptoms associated with his diagnosis.    Reduce overall level, frequency, and intensity of the feelings of depression, anxiety and  panic evidenced by decreased overall symptoms from 6 to 7 days/week to 0 to 1 days/week per client report for at least 3 consecutive months. Verbally express understanding of the relationship between feelings of depression, anxiety and their impact on thinking patterns and behaviors. Verbalize an understanding of the role that distorted thinking plays in creating fears, excessive worry, and ruminations.    Mia Underwood participated in the creation of the treatment plan)   [name], LCSW

## 2024-01-04 ENCOUNTER — Other Ambulatory Visit (HOSPITAL_COMMUNITY): Payer: Self-pay | Admitting: Psychiatry

## 2024-01-21 ENCOUNTER — Ambulatory Visit (INDEPENDENT_AMBULATORY_CARE_PROVIDER_SITE_OTHER): Payer: Medicare PPO | Admitting: Psychology

## 2024-01-21 DIAGNOSIS — F324 Major depressive disorder, single episode, in partial remission: Secondary | ICD-10-CM | POA: Diagnosis not present

## 2024-01-21 NOTE — Progress Notes (Signed)
 [facility] Counselor Initial Adult Exam  Name: Mia Underwood Date: 01/21/2024 MRN: [id number] DOB: 1951/07/09 PCP: Dickie La, MD  Time Spent: 8:04 am - 8:51 am : 28 Minutes  Guardian/Payee:  Self    Paperwork requested: No   Reason for Visit /Presenting Problem: Depression.  Mental Status Exam: Appearance:   Casual     Behavior:  Appropriate  Motor:  Normal  Speech/Language:   Clear and Coherent  Affect:  Congruent  Mood:  anxious and dysthymic  Thought process:  normal  Thought content:    WNL  Sensory/Perceptual disturbances:    WNL  Orientation:  oriented to person, place, time/date, and situation  Attention:  Good  Concentration:  Good  Memory:  WNL  Fund of knowledge:   Good  Insight:    Good  Judgment:   Good  Impulse Control:  Good   Reported Symptoms:  Depression and interpersonal stressors.   Risk Assessment: Danger to Self:  No Self-injurious Behavior: No Danger to Others: No Duty to Warn:no Physical Aggression / Violence:No  Access to Firearms a concern: No  Gang Involvement:No  Patient / guardian was educated about steps to take if suicide or homicide risk level increases between visits: no While future psychiatric events cannot be accurately predicted, the patient does not currently require acute inpatient psychiatric care and does not currently meet [facility] involuntary commitment criteria.  Substance Abuse History: Current substance abuse: No     Tobacco: 2x-3x cigarettes a day.  Caffeine: 1x-2x per week coffee, 1-2x soda daily.  Alcohol: ~2x beers on weekend night. Marijuana use: denied. Previously smoked daily when "younger"  Past Psychiatric History:   Previous psychological history is significant for anxiety and depression Outpatient Providers:Victoria Winstead, LMFT & Dr. Donell Beers [facility]. Has a March 5th appointment. History of Psych Hospitalization: No  Psychological Testing:  na    Abuse History:   Victim of: No.,  na    Report needed: No. Victim of Neglect:No. Perpetrator of  na   Witness / Exposure to Domestic Violence: No   Protective Services Involvement: No  Witness to [employer] Violence:  No   Family History:  Family History  Problem Relation Age of Onset   Breast cancer Mother    Depression Mother    Hypertension Mother    Dementia Mother    Alcoholism Father        Died in his 02/23/2024 related to alcohol abuse   Leukemia Sister 8   Early death Brother        Died in a Librarian, academic accident   Heart attack Daughter 62       Per patient report, specifics unknown   Diabetes Mellitus II Maternal Grandmother    Diabetes Mellitus II Paternal Grandmother    Healthy Sister    Healthy Sister    Arthritis Brother    Colon polyps Brother    Healthy Brother    Healthy Brother    Healthy Son    Breast cancer Maternal Aunt    Breast cancer Cousin    Breast cancer Maternal Aunt    Colon cancer Neg Hx    Esophageal cancer Neg Hx    Rectal cancer Neg Hx    Stomach cancer Neg Hx     Living situation: the patient lives with their family  Sexual Orientation: Straight  Relationship Status: single  Name of spouse / other: na If a parent, number of children / ages: Para Skeans.  Grandchildren  [name] (32), [name] (25), and [name] (23).   Support Systems: Family.   Financial Stress:  No , noted having to manage her monthly budget.   Income/Employment/Disability: Doctor, general practice: No   Educational History: Education: some college  Religion/Sprituality/World View: Christian  Any cultural differences that may affect / interfere with treatment:  not applicable   Recreation/Hobbies: reading, crosswords puzzles, spades, backgammon, play on tablet, watch movies, planting.    Stressors: Health problems   Other: familial concerns    Strengths: Supportive Relationships, Church, and Spirituality  Barriers:  mood.    Legal  History: Pending legal issue / charges: The patient has no significant history of legal issues. History of legal issue / charges:  na  Medical History/Surgical History: reviewed Past Medical History:  Diagnosis Date   Allergic rhinitis 05/04/2016   Arthritis    Bilateral carpal tunnel syndrome 10/18/2010   Left > Right    Colles' fracture of left radius 07/01/2019   Diverticulosis of sigmoid colon 06/07/2010   Essential hypertension 09/14/2009   Healthcare maintenance 01/03/2016   Hyperlipidemia 09/14/2009   Insomnia secondary to depression with anxiety 05/04/2016   Major depression, recurrent, chronic (HCC) 09/14/2009   Marijuana abuse 04/04/2016   Muscle spasm of left shoulder 02/03/2019   Tobacco abuse 01/17/2016   Upper respiratory infection, viral 02/03/2019   Vitamin D deficiency 04/04/2016    Past Surgical History:  Procedure Laterality Date   ABDOMINAL HYSTERECTOMY  April 1996   For fibroids   COLONOSCOPY  07/18/2010   Patterson-tic's    Medications: Current Outpatient Medications  Medication Sig Dispense Refill   amLODipine-olmesartan (AZOR) 10-40 MG tablet Take 1 tablet by mouth daily. 90 tablet 3   cetirizine (ZYRTEC) 10 MG tablet Take 1 tablet (10 mg total) by mouth daily. 90 tablet 3   cyanocobalamin (VITAMIN B12) 1000 MCG tablet Take 1 tablet (1,000 mcg total) by mouth daily. 90 tablet 3   diclofenac Sodium (VOLTAREN) 1 % GEL Apply 4 g topically 4 (four) times daily. 150 g 3   gabapentin (NEURONTIN) 600 MG tablet Take 1 tablet (600 mg total) by mouth daily. 90 tablet 3   hydrochlorothiazide (HYDRODIURIL) 25 MG tablet TAKE 1 TABLET(25 MG) BY MOUTH DAILY 90 tablet 3   hydrOXYzine (VISTARIL) 25 MG capsule 1  qday  prn 30 capsule 4   rosuvastatin (CRESTOR) 20 MG tablet Take 1 tablet (20 mg total) by mouth daily. 90 tablet 3   sertraline (ZOLOFT) 100 MG tablet 1  qam 60 tablet 5   traZODone (DESYREL) 50 MG tablet Take 2 tablets (100 mg total) by mouth at bedtime.  60 tablet 3   Vitamin D, Cholecalciferol, 25 MCG (1000 UT) CAPS Take 1 capsule by mouth daily. 90 capsule 3   vitamin E 180 MG (400 UNITS) capsule Take 400 Units by mouth daily.     No current facility-administered medications for this visit.    Allergies  Allergen Reactions   Penicillins Hives   Naproxen Rash    Pt states she takes Aleve without problems    Diagnoses:  Major depressive disorder with single episode, in partial remission (HCC)  Plan of Care: Outpatient Therapy.   Narrative:  Gerene was referred by her primary care physician due to symptoms of depression.  This is her annual evaluation. She was initially referred due to symptoms of depression by Dr. Heide Spark. Zaia participated from her home, via video, and consented to treatment. Therapist participated from office. We  reviewed the limits of confidentiality prior to the start of the evaluation and Waniya expressed her understanding and agreement to proceed.  Kamilla has been attending treatment regularly and consistently engaged. She noted her current stressors include familial stressors, interpersonal stressors with friend, anxiety driving and leaving the home, and a recent unexpected loss of a family friend due to dementia. She continued being under the care of a psychiatrist, Dr. Donell Beers with [facility]. She has an upcoming appointment, in early March, and is hopeful to have her sleep medicine Trazodone to be changed to something "light". She noted feeling groggy with her current medication. She noted consistent stressors with her grandchildren, specifically after her daughter's passing (2017). Fujie endorsed depressive symptoms including loss of interest, feeling down, lethargy, fluctuating appetite, difficulty concentrating, and psychomotor retardation. She endorsed anxiety symptoms including feeling anxious, difficulty managing worry, worrying about different things, irritability, and feeling afraid something awful  might happen. She noted anxiety while driving, in the car, or leaving the home. She noted being irritated with people who are unhappy with life and noted this being a stressor for her. She noted difficulty dealing with people's negative approach towards her and noted having to reconcile this, at times. Hinley currently lives with her youngest grand-daughter, Purcell Mouton, and noted frequent interpersonal stressors. She noted a need to "learn how to let them go" in relation to her grandchildren and their general decision-making. She noted hope that her grandchildren would make better decisions and "not bring drama". She noted frustration regarding her grandchildren's repeated poor decision-making and looking to Billie for advice and reacting poorly when she isn't open to providing additional support regarding repeated issues in which her grandchildren continue to make the same decision repeatedly. Fe noted her disinterest in being the family's "problem-solver". She would benefit from continued counseling to address symptoms, process past events, verbalize thoughts and feelings, and set boundaries with others. Specific treatment focus on her anxiety related to leaving the home and driving will also be a treatment goal. Katerra was engaged and motivated during the evaluation. Therapist answered all questions prior to the end of the session. A follow-up was scheduled to create a treatment plan.   GAD-7:  15 PHQ-9: [address] LCSW

## 2024-01-30 ENCOUNTER — Ambulatory Visit (HOSPITAL_COMMUNITY): Payer: Medicare PPO | Admitting: Psychiatry

## 2024-02-05 ENCOUNTER — Ambulatory Visit: Payer: Medicare PPO | Admitting: Psychology

## 2024-02-05 DIAGNOSIS — F324 Major depressive disorder, single episode, in partial remission: Secondary | ICD-10-CM | POA: Diagnosis not present

## 2024-02-05 NOTE — Progress Notes (Signed)
 [facility] Counselor/Therapist Progress Note  Patient ID: Mia Underwood, MRN: [id number]   Date: 02/05/24  Time Spent: 9:07  am - 9:50 am : 43 Minutes  Treatment Type: Individual Therapy.  Reported Symptoms:  depression and anxiety.   Mental Status Exam: Appearance:  Casual     Behavior: Appropriate  Motor: Normal  Speech/Language:  Clear and Coherent  Affect: Congruent  Mood: dysthymic  Thought process: normal  Thought content:   WNL  Sensory/Perceptual disturbances:   WNL  Orientation: oriented to person, place, time/date, and situation  Attention: Good  Concentration: Good  Memory: WNL  Fund of knowledge:  Good  Insight:   Good  Judgment:  Good  Impulse Control: Good   Risk Assessment: Danger to Self:  No Self-injurious Behavior: No Danger to Others: No Duty to Warn:no Physical Aggression / Violence:No  Access to Firearms a concern: No  Gang Involvement:No   Subjective:   Mia Underwood participated from home, via video and consented to treatment. Therapist participated from office. I discussed the limitations of evaluation and management by telemedicine and the availability of in person appointments. The patient expressed understanding and agreed to proceed. Mia Underwood reviewed the events of the past week.   We reviewed numerous treatment approaches including CBT, BA, Problem Solving, and Solution focused therapy. Psych-education regarding the Mia Underwood's diagnosis of Major depressive disorder with single episode, in partial remission (HCC) was Underwood during the session. We discussed Mia Underwood's goals treatment goals which include improve frustration tolerance, manage interpersonal stressors, reduce avoidance, engage socially, manage symptoms proactively, process past events, verbalize thoughts and feelings, build a positive supportive system, engage in enjoyable activities, increase mindfulness, manage anxiety regarding driving (other drivers). Mia Underwood verbal approval of the treatment plan.   Interventions: Psycho-education & Goal Setting.   Diagnosis:  Major depressive disorder with single episode, in partial remission [facility])  Psychiatric Treatment: Yes , Dr. Donell Beers. See Chart.    Treatment Plan:  Client Abilities/Strengths Mia Underwood is forthcoming and motivated for change.   Support System:  Family and friends.   Client Treatment Preferences OPT  Client Statement of Needs Mia Underwood would like to improve frustration tolerance, manage interpersonal stressors, reduce avoidance, engage socially, manage symptoms proactively, process past events, verbalize thoughts and feelings, build a positive supportive system, engage in enjoyable activities, increase mindfulness, manage anxiety regarding driving (other drivers).   Treatment Level Weekly  Symptoms  Anxiety: feeling anxious, difficulty managing worry, worrying too much about different things, difficulty relaxing, easily annoyed or irritable, feelings afraid something awful might happen.    (Status: maintained) Depression:  Feeling down, lethargy, poor appetite, difficulty concentrating, psychomotor retardation.   (Status: maintained)  Goals:   Mia Underwood experiences symptoms of depression.   Treatment plan signed and available on s-drive:  No, pending.    Target Date: 02/04/25 Frequency: Weekly  Progress: 0 Modality: individual    Therapist will provide referrals for additional resources as appropriate.  Therapist will provide psycho-education regarding Mia Underwood diagnosis and corresponding treatment approaches and interventions. [name], LCSW will support the patient's ability to achieve the goals identified. will employ CBT, BA, Problem-solving, Solution Focused, Mindfulness,  coping skills, & other evidenced-based practices will be used to promote progress towards healthy functioning to help manage decrease symptoms associated with her diagnosis.   Reduce  overall level, frequency, and intensity of the feelings of depression, anxiety and panic evidenced by decreased overall symptoms from 6 to 7 days/week to 0 to  1 days/week per client report for at least 3 consecutive months. Verbally express understanding of the relationship between feelings of depression, anxiety and their impact on thinking patterns and behaviors. Verbalize an understanding of the role that distorted thinking plays in creating fears, excessive worry, and ruminations.    [name] in the creation of the treatment plan)   [name], LCSW

## 2024-02-25 ENCOUNTER — Ambulatory Visit (INDEPENDENT_AMBULATORY_CARE_PROVIDER_SITE_OTHER): Admitting: Psychology

## 2024-02-25 DIAGNOSIS — F324 Major depressive disorder, single episode, in partial remission: Secondary | ICD-10-CM

## 2024-02-25 NOTE — Progress Notes (Signed)
 [facility] Counselor/Therapist Progress Note  Patient ID: Mia Underwood, MRN: [id number]   Date: 02/25/24  Time Spent: 12:04  pm - 1:02 pm : 58 Minutes  Treatment Type: Individual Therapy.  Reported Symptoms:  depression and anxiety.   Mental Status Exam: Appearance:  Casual     Behavior: Appropriate  Motor: Normal  Speech/Language:  Clear and Coherent  Affect: Congruent  Mood: dysthymic  Thought process: normal  Thought content:   WNL  Sensory/Perceptual disturbances:   WNL  Orientation: oriented to person, place, time/date, and situation  Attention: Good  Concentration: Good  Memory: WNL  Fund of knowledge:  Good  Insight:   Good  Judgment:  Good  Impulse Control: Good   Risk Assessment: Danger to Self:  No Self-injurious Behavior: No Danger to Others: No Duty to Warn:no Physical Aggression / Violence:No  Access to Firearms a concern: No  Gang Involvement:No   Subjective:   Mia Underwood participated from home, via video and consented to treatment. Therapist participated from office.  Mia Underwood reviewed the events of the past week. Mia Underwood noted dealing with the anniversary of her daughter's passing. We worked on exploring this, during the session. She noted an upcoming appointment with her psychiatric provider and is hopeful to change her trazodone medication due to feeling groggy. She noted a recent disagreement with a friend who accused her of taking something from his home but would not tell her what the item was. She noted her feelings of frustration regarding this experience and noted needing figure out what she is "supposed to do". She noted having to manage her frustration regarding this behavior. She noted this leading her to "just staying home". We worked on processing this and her possible avoidance of leaving the home. Therapist highlighted Mia Underwood's generalizing and discussed the importance of challenging this during the session. She was noted her plan  to "have a cocktail". We explored her coping and she noted that she might "drink a couple of beers or smoke a cigarette". She noted often playing puzzles, as well. She noted having a drink prior to the session. We discussed the importance of being sober in session and the effect of this on mood and the sessions, as a whole. She noted "Maybe I understand when people say you don't have to tell your doctor everything" and  "not wanting to work on her wine drinking". We will explore her alcohol use and coping during our follow-up appointments. Mia Underwood committed to not drinking prior to our appointments going forward. Therapist praised Mia Underwood for her disclosure and commitment. Therapist validated Mia Underwood experience and provided supportive therapy. A follow-up was scheduled for continued treatment.   Interventions: CBT & Grief  Diagnosis:  Major depressive disorder with single episode, in partial remission [facility])  Psychiatric Treatment: Yes , Dr. Donell Beers. See Chart.    Treatment Plan:  Client Abilities/Strengths Mia Underwood is forthcoming and motivated for change.   Support System:  Family and friends.   Client Treatment Preferences OPT  Client Statement of Needs Mia Underwood would like to improve frustration tolerance, manage interpersonal stressors, reduce avoidance, engage socially, manage symptoms proactively, process past events, verbalize thoughts and feelings, build a positive supportive system, engage in enjoyable activities, increase mindfulness, manage anxiety regarding driving (other drivers).   Treatment Level Weekly  Symptoms  Anxiety: feeling anxious, difficulty managing worry, worrying too much about different things, difficulty relaxing, easily annoyed or irritable, feelings afraid something awful might happen.    (Status: maintained) Depression:  Feeling  down, lethargy, poor appetite, difficulty concentrating, psychomotor retardation.   (Status: maintained)  Goals:   Adisen experiences  symptoms of depression.   Treatment plan signed and available on s-drive:  No, pending.    Target Date: 02/04/25 Frequency: Weekly  Progress: 0 Modality: individual    Therapist will provide referrals for additional resources as appropriate.  Therapist will provide psycho-education regarding Mia Underwood's diagnosis and corresponding treatment approaches and interventions. [name], LCSW will support the patient's ability to achieve the goals identified. will employ CBT, BA, Problem-solving, Solution Focused, Mindfulness,  coping skills, & other evidenced-based practices will be used to promote progress towards healthy functioning to help manage decrease symptoms associated with her diagnosis.   Reduce overall level, frequency, and intensity of the feelings of depression, anxiety and panic evidenced by decreased overall symptoms from 6 to 7 days/week to 0 to 1 days/week per client report for at least 3 consecutive months. Verbally express understanding of the relationship between feelings of depression, anxiety and their impact on thinking patterns and behaviors. Verbalize an understanding of the role that distorted thinking plays in creating fears, excessive worry, and ruminations.    [name] in the creation of the treatment plan)   [name], LCSW

## 2024-02-26 ENCOUNTER — Encounter: Payer: Self-pay | Admitting: Internal Medicine

## 2024-02-26 ENCOUNTER — Ambulatory Visit (INDEPENDENT_AMBULATORY_CARE_PROVIDER_SITE_OTHER): Payer: Self-pay | Admitting: Internal Medicine

## 2024-02-26 VITALS — BP 134/67 | HR 68 | Temp 98.2°F | Ht 61.0 in | Wt 158.1 lb

## 2024-02-26 DIAGNOSIS — E7800 Pure hypercholesterolemia, unspecified: Secondary | ICD-10-CM | POA: Diagnosis not present

## 2024-02-26 DIAGNOSIS — F17210 Nicotine dependence, cigarettes, uncomplicated: Secondary | ICD-10-CM | POA: Diagnosis not present

## 2024-02-26 DIAGNOSIS — L989 Disorder of the skin and subcutaneous tissue, unspecified: Secondary | ICD-10-CM | POA: Diagnosis not present

## 2024-02-26 DIAGNOSIS — F332 Major depressive disorder, recurrent severe without psychotic features: Secondary | ICD-10-CM | POA: Diagnosis not present

## 2024-02-26 DIAGNOSIS — I10 Essential (primary) hypertension: Secondary | ICD-10-CM

## 2024-02-26 DIAGNOSIS — I700 Atherosclerosis of aorta: Secondary | ICD-10-CM

## 2024-02-26 DIAGNOSIS — E785 Hyperlipidemia, unspecified: Secondary | ICD-10-CM | POA: Diagnosis not present

## 2024-02-26 DIAGNOSIS — F17200 Nicotine dependence, unspecified, uncomplicated: Secondary | ICD-10-CM

## 2024-02-26 DIAGNOSIS — R7303 Prediabetes: Secondary | ICD-10-CM | POA: Diagnosis not present

## 2024-02-26 DIAGNOSIS — E559 Vitamin D deficiency, unspecified: Secondary | ICD-10-CM | POA: Diagnosis not present

## 2024-02-26 DIAGNOSIS — M8580 Other specified disorders of bone density and structure, unspecified site: Secondary | ICD-10-CM | POA: Diagnosis not present

## 2024-02-26 NOTE — Assessment & Plan Note (Signed)
 Previously had quit smoking, although today noting she has  intermittently been smoking again. We discussed reasons for quitting and she would like to try on her own. Not sure total pack-years qualifies for lung cancer screening, however prior scan in 2022 with pulmonary nodules and recommended f/u scan. We discussed today and she would like to get this done.  Plan -CT chest -Continue cessation counseling at future visits

## 2024-02-26 NOTE — Assessment & Plan Note (Signed)
 Prediabetes with A1c of 6.1% diagnosed July 2024. Continue healthy lifestyle measures including diet and exercise which were discussed today.  Plan -Annual A1c

## 2024-02-26 NOTE — Assessment & Plan Note (Addendum)
 Blood pressure at goal on repeat value today. She has been adherent to all medications. She denies any SOB, chest pain, edema, or syncope. No symptoms of orthostasis. We discussed smoking cessation and dietary changes for further BP management.  Plan  - Continue amlodipine-olmesartan and hydrochlorothiazide  - f/u in 3 months

## 2024-02-26 NOTE — Progress Notes (Signed)
 Established Patient Office Visit  Subjective   Patient ID: Mia Underwood, female    DOB: Mar 09, 1951  Age: 73 y.o. MRN: [id number]  Chief Complaint  Patient presents with   Medical Management of Chronic Issues    Occasional dizziness.    Mia Underwood returns to clinic today for follow-up of chronic medical conditions. Please see assessment/plan in problem-based charting for further details of today's visit.     Patient Active Problem List   Diagnosis Date Noted   Prediabetes 06/11/2023   Skin lesion 06/11/2023   Aortic atherosclerosis (HCC) 04/02/2023   Mild aortic valve regurgitation 01/01/2023   Osteopenia 12/16/2019   Neuropathy of left lower extremity 09/03/2019   Leg cramping 09/09/2018   Allergic rhinitis 05/04/2016   Insomnia secondary to depression with anxiety 05/04/2016   Marijuana abuse 04/04/2016   Vitamin D deficiency 04/04/2016   Current smoker on some days 01/17/2016   Bilateral carpal tunnel syndrome 10/18/2010   Diverticulosis of sigmoid colon 06/07/2010   Hyperlipidemia 09/14/2009   Moderately severe recurrent major depression (HCC) 09/14/2009   Essential hypertension 09/14/2009      Objective:     BP 134/67 (BP Location: Right Arm, Patient Position: Sitting, Cuff Size: Normal)   Pulse 68   Temp 98.2 F (36.8 C) (Oral)   Ht 5\' 1"  (1.549 m)   Wt 158 lb 1.6 oz (71.7 kg)   SpO2 97% Comment: RA  BMI 29.87 kg/m  BP Readings from Last 3 Encounters:  02/26/24 134/67  07/31/23 (!) 143/66  07/03/23 (!) 155/88   Wt Readings from Last 3 Encounters:  02/26/24 158 lb 1.6 oz (71.7 kg)  07/03/23 155 lb 6.4 oz (70.5 kg)  06/11/23 155 lb 11.2 oz (70.6 kg)    Physical Exam Vitals reviewed.  Constitutional:      Appearance: Normal appearance.  Pulmonary:     Effort: Pulmonary effort is normal.  Musculoskeletal:        General: No swelling.  Neurological:     General: No focal deficit present.     Mental Status: She is alert. Mental status is at  baseline.     Gait: Gait normal.  Psychiatric:        Mood and Affect: Mood normal.        Behavior: Behavior normal.       Assessment & Plan:   Problem List Items Addressed This Visit       Cardiovascular and Mediastinum   Essential hypertension - Primary (Chronic)   Blood pressure at goal on repeat value today. She has been adherent to all medications. She denies any SOB, chest pain, edema, or syncope. No symptoms of orthostasis. We discussed smoking cessation and dietary changes for further BP management.  Plan  - Continue amlodipine-olmesartan and hydrochlorothiazide  - f/u in 3 months      Relevant Orders   BMP8+Anion Gap   Aortic atherosclerosis (HCC)   Noted on CT chest 04/2021. Continue risk factor modification including lifestyle changes and statin therapy.        Musculoskeletal and Integument   Osteopenia   DEXA remained consistent with osteopenia in 2024. Continue with calcium-rich foods and exercise, vitamin D supplementation.  Plan -Consider repeat DEXA in 2026      Skin lesion   Stable, flesh colored papule over left posterior thigh. Appears benign.         Other   Hyperlipidemia (Chronic)   Remains on rosuvastatin 20 mg. Recheck today.  Plan -Lipid panel -  Continue rosuvastatin 20 mg      Relevant Orders   Lipid Profile   BMP8+Anion Gap   Vitamin D deficiency (Chronic)   History of osteopenia. She denies any recent falls. Taking vitamin D daily.  Plan  - Continue vitamin D supplementation      Moderately severe recurrent major depression (HCC)   PHQ9 stable at 0. Mia Underwood is doing well and continues to follow with psychiatry, Dr. Alisia Ferrari. She notes not wanting to take trazodone any longer and has stopped. Encouraged her to discuss with Dr. Donell Beers.  Plan -Continue sertraline 100 mg daily -Continue hydroxyzine 25 mg prn -Fu with Dr. Donell Beers       Current smoker on some days   Previously had quit smoking, although today noting she  has  intermittently been smoking again. We discussed reasons for quitting and she would like to try on her own. Not sure total pack-years qualifies for lung cancer screening, however prior scan in 2022 with pulmonary nodules and recommended f/u scan. We discussed today and she would like to get this done.  Plan -CT chest -Continue cessation counseling at future visits      Relevant Orders   CT CHEST LUNG CA SCREEN LOW DOSE W/O CM   Prediabetes   Prediabetes with A1c of 6.1% diagnosed July 2024. Continue healthy lifestyle measures including diet and exercise which were discussed today.  Plan -Annual A1c       Return in about 3 months (around 05/27/2024) for f/u BP.    Dickie La, MD

## 2024-02-26 NOTE — Assessment & Plan Note (Signed)
 Stable, flesh colored papule over left posterior thigh. Appears benign.

## 2024-02-26 NOTE — Patient Instructions (Addendum)
 It was wonderful to see you today! I'm glad you are doing well.  I will call you with your lab results and we will talk about if another medication is needed for blood pressure.  Please continue to work on completely quitting smoking. Try not to replace it with unhealthy snacks. Let me know if you need more help!  I have ordered the CT scan of your chest to look for lung cancer.  *For exercise classes (in-person and online), group activities, and more* (50+)            [email or web address]

## 2024-02-26 NOTE — Assessment & Plan Note (Signed)
 PHQ9 stable at 0. Mia Underwood is doing well and continues to follow with psychiatry, Dr. Alisia Ferrari. She notes not wanting to take trazodone any longer and has stopped. Encouraged her to discuss with Dr. Donell Beers.  Plan -Continue sertraline 100 mg daily -Continue hydroxyzine 25 mg prn -Fu with Dr. Donell Beers

## 2024-02-26 NOTE — Assessment & Plan Note (Signed)
 History of osteopenia. She denies any recent falls. Taking vitamin D daily.  Plan  - Continue vitamin D supplementation

## 2024-02-26 NOTE — Assessment & Plan Note (Signed)
 Noted on CT chest 04/2021. Continue risk factor modification including lifestyle changes and statin therapy.

## 2024-02-26 NOTE — Assessment & Plan Note (Signed)
 DEXA remained consistent with osteopenia in 2024. Continue with calcium-rich foods and exercise, vitamin D supplementation.  Plan -Consider repeat DEXA in 2026

## 2024-02-26 NOTE — Assessment & Plan Note (Signed)
 Remains on rosuvastatin 20 mg. Recheck today.  Plan -Lipid panel -Continue rosuvastatin 20 mg

## 2024-02-27 LAB — BMP8+ANION GAP
Anion Gap: 15 mmol/L (ref 10.0–18.0)
BUN/Creatinine Ratio: 24 (ref 12–28)
BUN: 17 mg/dL (ref 8–27)
CO2: 23 mmol/L (ref 20–29)
Calcium: 9.3 mg/dL (ref 8.7–10.3)
Chloride: 107 mmol/L — ABNORMAL HIGH (ref 96–106)
Creatinine, Ser: 0.71 mg/dL (ref 0.57–1.00)
Glucose: 96 mg/dL (ref 70–99)
Potassium: 4 mmol/L (ref 3.5–5.2)
Sodium: 145 mmol/L — ABNORMAL HIGH (ref 134–144)
eGFR: 91 mL/min/{1.73_m2} (ref >59)

## 2024-02-27 LAB — LIPID PANEL
Chol/HDL Ratio: 3.4 ratio (ref 0.0–4.4)
Cholesterol, Total: 168 mg/dL (ref 100–199)
HDL: 50 mg/dL (ref >39)
LDL Chol Calc (NIH): 94 mg/dL (ref 0–99)
Triglycerides: 137 mg/dL (ref 0–149)
VLDL Cholesterol Cal: 24 mg/dL (ref 5–40)

## 2024-02-27 NOTE — Progress Notes (Signed)
 Reviewed labs with Ms. Laird 4/2. Encouraged her to stay hydrated as her sodium is mildly elevated, likely reflecting mild dehydration.

## 2024-03-05 ENCOUNTER — Telehealth: Payer: Self-pay

## 2024-03-05 ENCOUNTER — Ambulatory Visit (HOSPITAL_BASED_OUTPATIENT_CLINIC_OR_DEPARTMENT_OTHER): Admitting: Psychiatry

## 2024-03-05 ENCOUNTER — Other Ambulatory Visit: Payer: Self-pay

## 2024-03-05 ENCOUNTER — Encounter (HOSPITAL_COMMUNITY): Payer: Self-pay | Admitting: Psychiatry

## 2024-03-05 ENCOUNTER — Ambulatory Visit

## 2024-03-05 VITALS — BP 173/78 | HR 81 | Ht 61.0 in | Wt 157.0 lb

## 2024-03-05 DIAGNOSIS — Z0000 Encounter for general adult medical examination without abnormal findings: Secondary | ICD-10-CM | POA: Diagnosis not present

## 2024-03-05 DIAGNOSIS — F325 Major depressive disorder, single episode, in full remission: Secondary | ICD-10-CM

## 2024-03-05 MED ORDER — DOXEPIN HCL 25 MG PO CAPS: 25.0000 mg | ORAL_CAPSULE | Freq: Every day | ORAL | 4 refills | Status: DC

## 2024-03-05 MED ORDER — SERTRALINE HCL 100 MG PO TABS: ORAL_TABLET | ORAL | 5 refills | Status: DC

## 2024-03-05 NOTE — Patient Instructions (Addendum)
 Health Maintenance, Female Adopting a healthy lifestyle and getting preventive care are important in promoting health and wellness. Ask your health care provider about: The right schedule for you to have regular tests and exams. Things you can do on your own to prevent diseases and keep yourself healthy. What should I know about diet, weight, and exercise? Eat a healthy diet  Eat a diet that includes plenty of vegetables, fruits, low-fat dairy products, and lean protein. Do not eat a lot of foods that are high in solid fats, added sugars, or sodium. Maintain a healthy weight Body mass index (BMI) is used to identify weight problems. It estimates body fat based on height and weight. Your health care provider can help determine your BMI and help you achieve or maintain a healthy weight. Get regular exercise Get regular exercise. This is one of the most important things you can do for your health. Most adults should: Exercise for at least 150 minutes each week. The exercise should increase your heart rate and make you sweat (moderate-intensity exercise). Do strengthening exercises at least twice a week. This is in addition to the moderate-intensity exercise. Spend less time sitting. Even light physical activity can be beneficial. Watch cholesterol and blood lipids Have your blood tested for lipids and cholesterol at 72 years of age, then have this test every 5 years. Have your cholesterol levels checked more often if: Your lipid or cholesterol levels are high. You are older than 72 years of age. You are at high risk for heart disease. What should I know about cancer screening? Depending on your health history and family history, you may need to have cancer screening at various ages. This may include screening for: Breast cancer. Cervical cancer. Colorectal cancer. Skin cancer. Lung cancer. What should I know about heart disease, diabetes, and high blood pressure? Blood pressure and heart  disease High blood pressure causes heart disease and increases the risk of stroke. This is more likely to develop in people who have high blood pressure readings or are overweight. Have your blood pressure checked: Every 3-5 years if you are 42-63 years of age. Every year if you are 9 years old or older. Diabetes Have regular diabetes screenings. This checks your fasting blood sugar level. Have the screening done: Once every three years after age 51 if you are at a normal weight and have a low risk for diabetes. More often and at a younger age if you are overweight or have a high risk for diabetes. What should I know about preventing infection? Hepatitis B If you have a higher risk for hepatitis B, you should be screened for this virus. Talk with your health care provider to find out if you are at risk for hepatitis B infection. Hepatitis C Testing is recommended for: Everyone born from [birth year] through 1964. Anyone with known risk factors for hepatitis C. Sexually transmitted infections (STIs) Get screened for STIs, including gonorrhea and chlamydia, if: You are sexually active and are younger than 72 years of age. You are older than 72 years of age and your health care provider tells you that you are at risk for this type of infection. Your sexual activity has changed since you were last screened, and you are at increased risk for chlamydia or gonorrhea. Ask your health care provider if you are at risk. Ask your health care provider about whether you are at high risk for HIV. Your health care provider may recommend a prescription medicine to help prevent HIV  infection. If you choose to take medicine to prevent HIV, you should first get tested for HIV. You should then be tested every 3 months for as long as you are taking the medicine. Pregnancy If you are about to stop having your period (premenopausal) and you may become pregnant, seek counseling before you get pregnant. Take 400 to 800  micrograms (mcg) of folic acid every day if you become pregnant. Ask for birth control (contraception) if you want to prevent pregnancy. Osteoporosis and menopause Osteoporosis is a disease in which the bones lose minerals and strength with aging. This can result in bone fractures. If you are 33 years old or older, or if you are at risk for osteoporosis and fractures, ask your health care provider if you should: Be screened for bone loss. Take a calcium or vitamin D supplement to lower your risk of fractures. Be given hormone replacement therapy (HRT) to treat symptoms of menopause. Follow these instructions at home: Alcohol use Do not drink alcohol if: Your health care provider tells you not to drink. You are pregnant, may be pregnant, or are planning to become pregnant. If you drink alcohol: Limit how much you have to: 0-1 drink a day. Know how much alcohol is in your drink. In the [place] one drink equals one 12 oz bottle of beer (355 mL), one 5 oz glass of wine (148 mL), or one 1 oz glass of hard liquor (44 mL). Lifestyle Do not use any products that contain nicotine or tobacco. These products include cigarettes, chewing tobacco, and vaping devices, such as e-cigarettes. If you need help quitting, ask your health care provider. Do not use street drugs. Do not share needles. Ask your health care provider for help if you need support or information about quitting drugs. General instructions Schedule regular health, dental, and eye exams. Stay current with your vaccines. Tell your health care provider if: You often feel depressed. You have ever been abused or do not feel safe at home. Summary Adopting a healthy lifestyle and getting preventive care are important in promoting health and wellness. Follow your health care provider's instructions about healthy diet, exercising, and getting tested or screened for diseases. Follow your health care provider's instructions on monitoring your  cholesterol and blood pressure. This information is not intended to replace advice given to you by your health care provider. Make sure you discuss any questions you have with your health care provider. Document Revised: 04/04/2021 Document Reviewed: 04/04/2021 Elsevier Patient Education  2024 [employer] Health Maintenance, Female Adopting a healthy lifestyle and getting preventive care are important in promoting health and wellness. Ask your health care provider about: The right schedule for you to have regular tests and exams. Things you can do on your own to prevent diseases and keep yourself healthy. What should I know about diet, weight, and exercise? Eat a healthy diet  Eat a diet that includes plenty of vegetables, fruits, low-fat dairy products, and lean protein. Do not eat a lot of foods that are high in solid fats, added sugars, or sodium. Maintain a healthy weight Body mass index (BMI) is used to identify weight problems. It estimates body fat based on height and weight. Your health care provider can help determine your BMI and help you achieve or maintain a healthy weight. Get regular exercise Get regular exercise. This is one of the most important things you can do for your health. Most adults should: Exercise for at least 150 minutes each week. The exercise  should increase your heart rate and make you sweat (moderate-intensity exercise). Do strengthening exercises at least twice a week. This is in addition to the moderate-intensity exercise. Spend less time sitting. Even light physical activity can be beneficial. Watch cholesterol and blood lipids Have your blood tested for lipids and cholesterol at 72 years of age, then have this test every 5 years. Have your cholesterol levels checked more often if: Your lipid or cholesterol levels are high. You are older than 72 years of age. You are at high risk for heart disease. What should I know about cancer screening? Depending  on your health history and family history, you may need to have cancer screening at various ages. This may include screening for: Breast cancer. Cervical cancer. Colorectal cancer. Skin cancer. Lung cancer. What should I know about heart disease, diabetes, and high blood pressure? Blood pressure and heart disease High blood pressure causes heart disease and increases the risk of stroke. This is more likely to develop in people who have high blood pressure readings or are overweight. Have your blood pressure checked: Every 3-5 years if you are 36-57 years of age. Every year if you are 79 years old or older. Diabetes Have regular diabetes screenings. This checks your fasting blood sugar level. Have the screening done: Once every three years after age 53 if you are at a normal weight and have a low risk for diabetes. More often and at a younger age if you are overweight or have a high risk for diabetes. What should I know about preventing infection? Hepatitis B If you have a higher risk for hepatitis B, you should be screened for this virus. Talk with your health care provider to find out if you are at risk for hepatitis B infection. Hepatitis C Testing is recommended for: Everyone born from [birth year] through 1965. Anyone with known risk factors for hepatitis C. Sexually transmitted infections (STIs) Get screened for STIs, including gonorrhea and chlamydia, if: You are sexually active and are younger than 72 years of age. You are older than 72 years of age and your health care provider tells you that you are at risk for this type of infection. Your sexual activity has changed since you were last screened, and you are at increased risk for chlamydia or gonorrhea. Ask your health care provider if you are at risk. Ask your health care provider about whether you are at high risk for HIV. Your health care provider may recommend a prescription medicine to help prevent HIV infection. If you choose to  take medicine to prevent HIV, you should first get tested for HIV. You should then be tested every 3 months for as long as you are taking the medicine. Pregnancy If you are about to stop having your period (premenopausal) and you may become pregnant, seek counseling before you get pregnant. Take 400 to 800 micrograms (mcg) of folic acid every day if you become pregnant. Ask for birth control (contraception) if you want to prevent pregnancy. Osteoporosis and menopause Osteoporosis is a disease in which the bones lose minerals and strength with aging. This can result in bone fractures. If you are 29 years old or older, or if you are at risk for osteoporosis and fractures, ask your health care provider if you should: Be screened for bone loss. Take a calcium or vitamin D supplement to lower your risk of fractures. Be given hormone replacement therapy (HRT) to treat symptoms of menopause. Follow these instructions at home: Alcohol use Do  not drink alcohol if: Your health care provider tells you not to drink. You are pregnant, may be pregnant, or are planning to become pregnant. If you drink alcohol: Limit how much you have to: 0-1 drink a day. Know how much alcohol is in your drink. In the [place] one drink equals one 12 oz bottle of beer (355 mL), one 5 oz glass of wine (148 mL), or one 1 oz glass of hard liquor (44 mL). Lifestyle Do not use any products that contain nicotine or tobacco. These products include cigarettes, chewing tobacco, and vaping devices, such as e-cigarettes. If you need help quitting, ask your health care provider. Do not use street drugs. Do not share needles. Ask your health care provider for help if you need support or information about quitting drugs. General instructions Schedule regular health, dental, and eye exams. Stay current with your vaccines. Tell your health care provider if: You often feel depressed. You have ever been abused or do not feel safe at  home. Summary Adopting a healthy lifestyle and getting preventive care are important in promoting health and wellness. Follow your health care provider's instructions about healthy diet, exercising, and getting tested or screened for diseases. Follow your health care provider's instructions on monitoring your cholesterol and blood pressure. This information is not intended to replace advice given to you by your health care provider. Make sure you discuss any questions you have with your health care provider. Document Revised: 04/04/2021 Document Reviewed: 04/04/2021 Elsevier Patient Education  2024 [employer].

## 2024-03-05 NOTE — Progress Notes (Signed)
 Psychiatric Initial Adult Assessment   Patient Identification: Mia Underwood MRN:  [id number] Date of Evaluation:  03/05/2024 Referral Source: Waynetta Sandy McKinzie Chief Complaint: Clinical dg along well as best she can. Virtual Visit via Telephone Note   Today the patient is seen in the office.  She is actually doing quite well.  Her only issue is that sometimes the trazodone makes her sedated the next day.  Overall though she denies daily depression and is eating well.  She has got good energy.  She loves to read she loves music.  She is watching TV.  She lives independently.  She takes her medicines as prescribed.  She denies use of alcohol or drugs.  She denies any psychotic symptoms.  The patient is engageable and friendly and today feels healthy.   I connected with PETE MERTEN on 03/05/24 at  3:00 PM EDT by telephone and verified that I am speaking with the correct person using two identifiers.  Location: Patient: home Provider: office   I discussed the limitations, risks, security and privacy concerns of performing an evaluation and management service by telephone and the availability of in person appointments. I also discussed with the patient that there may be a patient responsible charge related to this service. The patient expressed understanding and agreed to proceed.  ructions:    I discussed the assessment and treatment plan with the patient. The patient was provided an opportunity to ask questions and all were answered. The patient agreed with the plan and demonstrated an understanding of the instructions.   The patient was advised to call back or seek an in-person evaluation if the symptoms worsen or if the condition fails to improve as anticipated.  I provided 15 minutes of non-face-to-face time during this encounter.   Gypsy Balsam, MD  Past Psychiatric History:   Previous Psychotropic Medications: Yes   Substance Abuse History in the last 12 months:  Yes.     Consequences of Substance Abuse:   Past Medical History:  Past Medical History:  Diagnosis Date   Allergic rhinitis 05/04/2016   Arthritis    Bilateral carpal tunnel syndrome 10/18/2010   Left > Right    Colles' fracture of left radius 07/01/2019   Diverticulosis of sigmoid colon 06/07/2010   Essential hypertension 09/14/2009   Healthcare maintenance 01/03/2016   Hyperlipidemia 09/14/2009   Insomnia secondary to depression with anxiety 05/04/2016   Major depression, recurrent, chronic (HCC) 09/14/2009   Marijuana abuse 04/04/2016   Muscle spasm of left shoulder 02/03/2019   Tobacco abuse 01/17/2016   Upper respiratory infection, viral 02/03/2019   Vitamin D deficiency 04/04/2016    Past Surgical History:  Procedure Laterality Date   ABDOMINAL HYSTERECTOMY  April 1996   For fibroids   COLONOSCOPY  07/18/2010   Patterson-tic's    Family Psychiatric History:   Family History:  Family History  Problem Relation Age of Onset   Breast cancer Mother    Depression Mother    Hypertension Mother    Dementia Mother    Alcoholism Father        Died in his 64's related to alcohol abuse   Leukemia Sister 8   Early death Brother        Died in a Librarian, academic accident   Heart attack Daughter 67       Per patient report, specifics unknown   Diabetes Mellitus II Maternal Grandmother    Diabetes Mellitus II Paternal Grandmother    Healthy Sister  Healthy Sister    Arthritis Brother    Colon polyps Brother    Healthy Brother    Healthy Brother    Healthy Son    Breast cancer Maternal Aunt    Breast cancer Cousin    Breast cancer Maternal Aunt    Colon cancer Neg Hx    Esophageal cancer Neg Hx    Rectal cancer Neg Hx    Stomach cancer Neg Hx     Social History:   Social History   Socioeconomic History   Marital status: Single    Spouse name: Not on file   Number of children: 2   Years of education: 12 grade   Highest education level: Not on file   Occupational History   Occupation: Research officer, 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[employer]    Comment: Retired  Tobacco Use   Smoking status: Former    Current packs/day: 0.00    Average packs/day: 0.2 packs/day for 48.7 years (9.7 ttl pk-yrs)    Types: Cigarettes    Start date: 12/02/1973    Quit date: 07/2022    Years since quitting: 1.6   Smokeless tobacco: Never  Vaping Use   Vaping status: Never Used  Substance and Sexual Activity   Alcohol use: Yes    Alcohol/week: 2.0 standard drinks of alcohol    Types: 1 Glasses of wine, 1 Cans of beer per week    Comment: drinks socially with friends   Drug use: No   Sexual activity: Not Currently  Other Topics Concern   Not on file  Social History Narrative   Graduated from [employer] in [place].  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      Current Social History 03/01/2021   Patient lives with granddaughter in an apartment on the first floor. There are not steps up to the entrance the patient uses.       Patient's method of transportation is personal car.      The highest level of education was some college.      The patient currently retired.      Identified important Relationships are "My brother, my son"       Pets : None, but I would like to have one       Interests / Fun: Love to read, play Copywriter, advertising, Control and instrumentation engineer with sisters       Current Stressors: My community is getting violent, strained family relationships      Religious / Personal Beliefs: Baptized [employer]ist       Social Drivers of Corporate investment banker Strain: Low Risk  (03/05/2024)   Overall Financial Resource Strain (CARDIA)    Difficulty of Paying Living Expenses: Not hard at all  Food Insecurity: No Food Insecurity (03/05/2024)   Hunger Vital Sign    Worried About Running Out of Food in the Last Year: Never true    Ran Out of Food in the Last Year: Never true  Transportation Needs: No Transportation Needs (03/05/2024)   PRAPARE -  Administrator, Civil Service (Medical): No    Lack of Transportation (Non-Medical): No  Physical Activity: Insufficiently Active (03/05/2024)   Exercise Vital Sign    Days of Exercise per Week: 2 days    Minutes of Exercise per Session: 10 min  Stress: No Stress Concern Present (03/05/2024)   [employer] of Occupational Health - Occupational Stress Questionnaire    Feeling of  Stress : Not at all  Social Connections: Moderately Integrated (03/05/2024)   Social Connection and Isolation Panel [NHANES]    Frequency of Communication with Friends and Family: More than three times a week    Frequency of Social Gatherings with Friends and Family: Once a week    Attends Religious Services: More than 4 times per year    Active Member of [employer] or Organizations: Yes    Attends Banker Meetings: More than 4 times per year    Marital Status: Widowed    Additional Social History:   Allergies:   Allergies  Allergen Reactions   Penicillins Hives   Naproxen Rash    Pt states she takes Aleve without problems    Metabolic Disorder Labs: Lab Results  Component Value Date   HGBA1C 6.1 (H) 06/11/2023   No results found for: "PROLACTIN" Lab Results  Component Value Date   CHOL 168 02/26/2024   TRIG 137 02/26/2024   HDL 50 02/26/2024   CHOLHDL 3.4 02/26/2024   VLDL 20 11/01/2010   LDLCALC 94 02/26/2024   LDLCALC 96 04/04/2022     Current Medications: Current Outpatient Medications  Medication Sig Dispense Refill   amLODipine-olmesartan (AZOR) 10-40 MG tablet Take 1 tablet by mouth daily. 90 tablet 3   cetirizine (ZYRTEC) 10 MG tablet Take 1 tablet (10 mg total) by mouth daily. 90 tablet 3   Cholecalciferol (VITAMIN D3) 10 MCG (400 UNIT) tablet Take 400 Units by mouth daily.     cyanocobalamin (VITAMIN B12) 1000 MCG tablet Take 1 tablet (1,000 mcg total) by mouth daily. 90 tablet 3   diclofenac Sodium (VOLTAREN) 1 % GEL Apply 4 g topically 4 (four) times daily.  150 g 3   doxepin (SINEQUAN) 25 MG capsule Take 1 capsule (25 mg total) by mouth at bedtime. 30 capsule 4   gabapentin (NEURONTIN) 600 MG tablet Take 1 tablet (600 mg total) by mouth daily. 90 tablet 3   hydrochlorothiazide (HYDRODIURIL) 25 MG tablet TAKE 1 TABLET(25 MG) BY MOUTH DAILY 90 tablet 3   hydrOXYzine (VISTARIL) 25 MG capsule 1  qday  prn 30 capsule 4   rosuvastatin (CRESTOR) 20 MG tablet Take 1 tablet (20 mg total) by mouth daily. 90 tablet 3   vitamin E 180 MG (400 UNITS) capsule Take 400 Units by mouth daily.     sertraline (ZOLOFT) 100 MG tablet 1  qam 60 tablet 5   No current facility-administered medications for this visit.    Neurologic: Headache: No Seizure: No Paresthesias:No  Musculoskeletal: Strength & Muscle Tone: within normal limits Gait & Station: normal Patient leans: N/A  Psychiatric Specialty Exam: ROS  Blood pressure (!) 173/78, pulse 81, height 5\' 1"  (1.549 m), weight 157 lb (71.2 kg).Body mass index is 29.66 kg/m.  General Appearance: Casual  Eye Contact:  Fair  Speech:  Clear and Coherent  Volume:  Normal  Mood:  Depressed  Affect:  Blunt  Thought Process:  Goal Directed  Orientation:  Full (Time, Place, and Person)  Thought Content:  WDL  Suicidal Thoughts:  No  Homicidal Thoughts:  No  Memory:  NA  Judgement:  Good  Insight:  Good  Psychomotor Activity:  Normal  Concentration:    Recall:  Fair  Fund of Knowledge:Good  Language: Poor  Akathisia:  No  Handed:  Right  AIMS (if indicated):    Assets:  Desire for Improvement  ADL's:  Intact  Cognition: WNL  Sleep:  Treatment Plan Summary: 4/9/20253:23 PM  This patient's diagnosis is major depression in remission.  She will continue to take 100 mg of Zoloft.  Her second problem is insomnia.  We will discontinue her.trazodone and begin her on doxepin 25 mg for sleep.  The patient return to see me in 3 months.  She is functioning very well.

## 2024-03-05 NOTE — Progress Notes (Deleted)
 Subjective:   Mia Underwood is a 73 y.o. female who presents for Medicare Annual (Subsequent) preventive examination.  Visit Complete: {[phone]}  Patient Medicare AWV questionnaire was completed by the patient on ***; I have confirmed that all information answered by patient is correct and no changes since this date.        Objective:    There were no vitals filed for this visit. There is no height or weight on file to calculate BMI.     02/26/2024    8:25 AM 07/03/2023    8:57 AM 06/11/2023   11:03 AM 04/02/2023    8:10 AM 02/12/2023    1:50 PM 02/12/2023   10:27 AM 01/01/2023    9:11 AM  Advanced Directives  Does Patient Have a Medical Advance Directive? No No No No No No No  Does patient want to make changes to medical advance directive?    No - Patient declined  No - Patient declined Yes (MAU/Ambulatory/Procedural Areas - Information given)  Would patient like information on creating a medical advance directive? No - Patient declined No - Patient declined No - Patient declined No - Patient declined No - Patient declined No - Patient declined Yes (MAU/Ambulatory/Procedural Areas - Information given)    Current Medications (verified) Outpatient Encounter Medications as of 03/05/2024  Medication Sig   amLODipine-olmesartan (AZOR) 10-40 MG tablet Take 1 tablet by mouth daily.   cetirizine (ZYRTEC) 10 MG tablet Take 1 tablet (10 mg total) by mouth daily.   Cholecalciferol (VITAMIN D3) 10 MCG (400 UNIT) tablet Take 400 Units by mouth daily.   cyanocobalamin (VITAMIN B12) 1000 MCG tablet Take 1 tablet (1,000 mcg total) by mouth daily.   diclofenac Sodium (VOLTAREN) 1 % GEL Apply 4 g topically 4 (four) times daily.   gabapentin (NEURONTIN) 600 MG tablet Take 1 tablet (600 mg total) by mouth daily.   hydrochlorothiazide (HYDRODIURIL) 25 MG tablet TAKE 1 TABLET(25 MG) BY MOUTH DAILY   hydrOXYzine (VISTARIL) 25 MG capsule 1  qday  prn   rosuvastatin (CRESTOR) 20 MG tablet  Take 1 tablet (20 mg total) by mouth daily.   sertraline (ZOLOFT) 100 MG tablet 1  qam   traZODone (DESYREL) 50 MG tablet Take 2 tablets (100 mg total) by mouth at bedtime.   vitamin E 180 MG (400 UNITS) capsule Take 400 Units by mouth daily.   No facility-administered encounter medications on file as of 03/05/2024.    Allergies (verified) Penicillins and Naproxen   History: Past Medical History:  Diagnosis Date   Allergic rhinitis 05/04/2016   Arthritis    Bilateral carpal tunnel syndrome 10/18/2010   Left > Right    Colles' fracture of left radius 07/01/2019   Diverticulosis of sigmoid colon 06/07/2010   Essential hypertension 09/14/2009   Healthcare maintenance 01/03/2016   Hyperlipidemia 09/14/2009   Insomnia secondary to depression with anxiety 05/04/2016   Major depression, recurrent, chronic (HCC) 09/14/2009   Marijuana abuse 04/04/2016   Muscle spasm of left shoulder 02/03/2019   Tobacco abuse 01/17/2016   Upper respiratory infection, viral 02/03/2019   Vitamin D deficiency 04/04/2016   Past Surgical History:  Procedure Laterality Date   ABDOMINAL HYSTERECTOMY  April 1996   For fibroids   COLONOSCOPY  07/18/2010   Patterson-tic's   Family History  Problem Relation Age of Onset   Breast cancer Mother    Depression Mother    Hypertension Mother    Dementia Mother    Alcoholism Father  Died in his 20's related to alcohol abuse   Leukemia Sister 8   Early death Brother        Died in a motor vehicle accident   Heart attack Daughter 67       Per patient report, specifics unknown   Diabetes Mellitus II Maternal Grandmother    Diabetes Mellitus II Paternal Grandmother    Healthy Sister    Healthy Sister    Arthritis Brother    Colon polyps Brother    Healthy Brother    Healthy Brother    Healthy Son    Breast cancer Maternal Aunt    Breast cancer Cousin    Breast cancer Maternal Aunt    Colon cancer Neg Hx    Esophageal cancer Neg Hx    Rectal  cancer Neg Hx    Stomach cancer Neg Hx    Social History   Socioeconomic History   Marital status: Single    Spouse name: Not on file   Number of children: 2   Years of education: 12 grade   Highest education level: Not on file  Occupational History   Occupation: Research officer, 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[employer]    Comment: Retired  Tobacco Use   Smoking status: Former    Current packs/day: 0.00    Average packs/day: 0.2 packs/day for 48.7 years (9.7 ttl pk-yrs)    Types: Cigarettes    Start date: 12/02/1973    Quit date: 07/2022    Years since quitting: 1.6   Smokeless tobacco: Never  Vaping Use   Vaping status: Never Used  Substance and Sexual Activity   Alcohol use: Yes    Alcohol/week: 2.0 standard drinks of alcohol    Types: 1 Glasses of wine, 1 Cans of beer per week    Comment: drinks socially with friends   Drug use: No   Sexual activity: Not Currently  Other Topics Concern   Not on file  Social History Narrative   Graduated from [employer] in [place].  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      Current Social History 03/01/2021   Patient lives with granddaughter in an apartment on the first floor. There are not steps up to the entrance the patient uses.       Patient's method of transportation is personal car.      The highest level of education was some college.      The patient currently retired.      Identified important Relationships are "My brother, my son"       Pets : None, but I would like to have one       Interests / Fun: Love to read, play Copywriter, advertising, Control and instrumentation engineer with sisters       Current Stressors: My community is getting violent, strained family relationships      Religious / Personal Beliefs: Baptized [employer] Adventist       Social Drivers of Health   Financial Resource Strain: Medium Risk (02/12/2023)   Overall Financial Resource Strain (CARDIA)    Difficulty of Paying Living Expenses: Somewhat hard  Food Insecurity:  Food Insecurity Present (02/12/2023)   Hunger Vital Sign    Worried About Running Out of Food in the Last Year: Sometimes true    Ran Out of Food in the Last Year: Sometimes true  Transportation Needs: No Transportation Needs (02/12/2023)   PRAPARE - Administrator, Civil Service (Medical):  No    Lack of Transportation (Non-Medical): No  Physical Activity: Inactive (02/12/2023)   Exercise Vital Sign    Days of Exercise per Week: 0 days    Minutes of Exercise per Session: 0 min  Stress: Stress Concern Present (02/12/2023)   [employer] of Occupational Health - Occupational Stress Questionnaire    Feeling of Stress : Very much  Social Connections: Socially Isolated (02/12/2023)   Social Connection and Isolation Panel [NHANES]    Frequency of Communication with Friends and Family: Once a week    Frequency of Social Gatherings with Friends and Family: Never    Attends Religious Services: Never    Database administrator or Organizations: No    Attends Banker Meetings: Never    Marital Status: Divorced    Tobacco Counseling Counseling given: Not Answered   Clinical Intake:                        Activities of Daily Living    07/03/2023    8:57 AM 06/11/2023   11:03 AM  In your present state of health, do you have any difficulty performing the following activities:  Hearing? 0 0  Vision? 0 0  Difficulty concentrating or making decisions? 0 1  Walking or climbing stairs? 1 1  Dressing or bathing? 0 0  Doing errands, shopping? 0 0    Patient Care Team: Dickie La, MD as PCP - General (Internal Medicine) Archer Asa, MD as Consulting Physician (Psychiatry) Jennye Boroughs as Counselor (Licensed Clinical Social Worker)  Indicate any recent Medical Services you may have received from other than [facility] providers in the past year (date may be approximate).     Assessment:   This is a routine wellness examination for  Mia Underwood.  Hearing/Vision screen No results found.   Goals Addressed   None   Depression Screen    02/26/2024    9:07 AM 07/03/2023    8:57 AM 06/11/2023   11:03 AM 04/02/2023    8:56 AM 02/12/2023    1:50 PM 02/12/2023   11:00 AM 01/01/2023   10:10 AM  PHQ 2/9 Scores  PHQ - 2 Score 0 0 0 4 2 2 4   PHQ- 9 Score    11 9 9 14     Fall Risk    02/26/2024    8:25 AM 07/03/2023    8:57 AM 06/11/2023   11:02 AM 04/02/2023    8:09 AM 02/12/2023    1:50 PM  Fall Risk   Falls in the past year? 0 0 0 0 0  Number falls in past yr: 0 0 0 0 0  Injury with Fall? 0 0 0 0 0  Risk for fall due to : No Fall Risks No Fall Risks No Fall Risks No Fall Risks No Fall Risks  Follow up  Falls evaluation completed;Falls prevention discussed Falls evaluation completed;Falls prevention discussed Falls evaluation completed;Falls prevention discussed Falls evaluation completed;Falls prevention discussed    MEDICARE RISK AT HOME:    TIMED UP AND GO:  Was the test performed?  {AMBTIMEDUPGO:[phone]}    Cognitive Function:        02/12/2023    1:50 PM  6CIT Screen  What Year? 0 points  What month? 0 points  What time? 0 points  Count back from 20 0 points  Months in reverse 0 points  Repeat phrase 0 points  Total Score 0 points    Immunizations  Immunization History  Administered Date(s) Administered   Fluad Quad(high Dose 65+) 08/08/2022   Influenza, High Dose Seasonal PF 12/27/2021   Influenza, Mdck, Trivalent,PF 6+ MOS(egg free) 07/31/2023   Influenza,inj,Quad PF,6+ Mos 08/02/2017, 09/09/2018, 09/03/2019, 10/26/2020   PFIZER(Purple Top)SARS-COV-2 Vaccination 01/23/2020, 02/17/2020, 10/18/2020   Pneumococcal Conjugate-13 11/30/2017   Pneumococcal Polysaccharide-23 02/10/2019   Td 06/17/2010   Tdap 03/22/2021    {TDAP status:2101805}  {Flu Vaccine status:2101806}  {Pneumococcal vaccine status:2101807}  {Covid-19 vaccine status:2101808}  Qualifies for Shingles Vaccine? {YES/NO:21197}   Zostavax completed {YES/NO:21197}  {Shingrix Completed?:2101804}  Screening Tests Health Maintenance  Topic Date Due   COVID-19 Vaccine (4 - 2024-25 season) 07/29/2023   MAMMOGRAM  06/13/2024   INFLUENZA VACCINE  06/27/2024   Medicare Annual Wellness (AWV)  03/05/2025   Colonoscopy  01/19/2026   DTaP/Tdap/Td (3 - Td or Tdap) 03/23/2031   Pneumonia Vaccine 53+ Years old  Completed   DEXA SCAN  Completed   Hepatitis C Screening  Completed   HPV VACCINES  Aged Out   Zoster Vaccines- Shingrix  Discontinued    Health Maintenance  Health Maintenance Due  Topic Date Due   COVID-19 Vaccine (4 - 2024-25 season) 07/29/2023    {Colorectal cancer screening:2101809}  {Mammogram status:21018020}  {Bone Density status:21018021}  Lung Cancer Screening: (Low Dose CT Chest recommended if Age 52-80 years, 20 pack-year currently smoking OR have quit w/in 15years.) {DOES NOT does:27190::"does not"} qualify.   Lung Cancer Screening Referral: ***  Additional Screening:  Hepatitis C Screening: {DOES NOT does:27190::"does not"} qualify; Completed ***  Vision Screening: Recommended annual ophthalmology exams for early detection of glaucoma and other disorders of the eye. Is the patient up to date with their annual eye exam?  {YES/NO:21197} Who is the provider or what is the name of the office in which the patient attends annual eye exams? *** If pt is not established with a provider, would they like to be referred to a provider to establish care? {YES/NO:21197}.   Dental Screening: Recommended annual dental exams for proper oral hygiene  Diabetic Foot Exam: {Diabetic Foot Exam:2101802}  Community Resource Referral / Chronic Care Management: CRR required this visit?  {YES/NO:21197}  CCM required this visit?  {CCM Required choices:[id number]}     Plan:     I have personally reviewed and noted the following in the patient's chart:   Medical and social history Use of alcohol,  tobacco or illicit drugs  Current medications and supplements including opioid prescriptions. {Opioid Prescriptions:[id number]} Functional ability and status Nutritional status Physical activity Advanced directives List of other physicians Hospitalizations, surgeries, and ER visits in previous 12 months Vitals Screenings to include cognitive, depression, and falls Referrals and appointments  In addition, I have reviewed and discussed with patient certain preventive protocols, quality metrics, and best practice recommendations. A written personalized care plan for preventive services as well as general preventive health recommendations were provided to patient.     Derrell Lolling, CMA   03/05/2024   After Visit Summary: {CHL AMB AWV After Visit Summary:[phone]}  Nurse Notes: NON FACE  TO FACE  .AWV

## 2024-03-05 NOTE — Progress Notes (Signed)
 Subjective:   Mia Underwood is a 73 y.o. female who presents for Medicare Annual (Subsequent) preventive examination.  Visit Complete: Virtual I connected with  [name] on 03/05/24 by a audio enabled telemedicine application and verified that I am speaking with the correct person using two identifiers.  Patient Location: Home  Provider Location: Office/Clinic  I discussed the limitations of evaluation and management by telemedicine. The patient expressed understanding and agreed to proceed.  Vital Signs: Because this visit was a virtual/telehealth visit, some criteria may be missing or patient reported. Any vitals not documented were not able to be obtained and vitals that have been documented are patient reported.        Objective:    There were no vitals filed for this visit. There is no height or weight on file to calculate BMI.     03/05/2024   10:42 AM 02/26/2024    8:25 AM 07/03/2023    8:57 AM 06/11/2023   11:03 AM 04/02/2023    8:10 AM 02/12/2023    1:50 PM 02/12/2023   10:27 AM  Advanced Directives  Does Patient Have a Medical Advance Directive? No;Yes No No No No No No  Type of Advance Directive Living will        Does patient want to make changes to medical advance directive?     No - Patient declined  No - Patient declined  Would patient like information on creating a medical advance directive?  No - Patient declined No - Patient declined No - Patient declined No - Patient declined No - Patient declined No - Patient declined    Current Medications (verified) Outpatient Encounter Medications as of 03/05/2024  Medication Sig   amLODipine-olmesartan (AZOR) 10-40 MG tablet Take 1 tablet by mouth daily.   cetirizine (ZYRTEC) 10 MG tablet Take 1 tablet (10 mg total) by mouth daily.   Cholecalciferol (VITAMIN D3) 10 MCG (400 UNIT) tablet Take 400 Units by mouth daily.   cyanocobalamin (VITAMIN B12) 1000 MCG tablet Take 1 tablet (1,000 mcg total) by mouth daily.    diclofenac Sodium (VOLTAREN) 1 % GEL Apply 4 g topically 4 (four) times daily.   gabapentin (NEURONTIN) 600 MG tablet Take 1 tablet (600 mg total) by mouth daily.   hydrochlorothiazide (HYDRODIURIL) 25 MG tablet TAKE 1 TABLET(25 MG) BY MOUTH DAILY   hydrOXYzine (VISTARIL) 25 MG capsule 1  qday  prn   rosuvastatin (CRESTOR) 20 MG tablet Take 1 tablet (20 mg total) by mouth daily.   sertraline (ZOLOFT) 100 MG tablet 1  qam   traZODone (DESYREL) 50 MG tablet Take 2 tablets (100 mg total) by mouth at bedtime.   vitamin E 180 MG (400 UNITS) capsule Take 400 Units by mouth daily.   No facility-administered encounter medications on file as of 03/05/2024.    Allergies (verified) Penicillins and Naproxen   History: Past Medical History:  Diagnosis Date   Allergic rhinitis 05/04/2016   Arthritis    Bilateral carpal tunnel syndrome 10/18/2010   Left > Right    Colles' fracture of left radius 07/01/2019   Diverticulosis of sigmoid colon 06/07/2010   Essential hypertension 09/14/2009   Healthcare maintenance 01/03/2016   Hyperlipidemia 09/14/2009   Insomnia secondary to depression with anxiety 05/04/2016   Major depression, recurrent, chronic (HCC) 09/14/2009   Marijuana abuse 04/04/2016   Muscle spasm of left shoulder 02/03/2019   Tobacco abuse 01/17/2016   Upper respiratory infection, viral 02/03/2019   Vitamin D deficiency  04/04/2016   Past Surgical History:  Procedure Laterality Date   ABDOMINAL HYSTERECTOMY  April 1996   For fibroids   COLONOSCOPY  07/18/2010   Patterson-tic's   Family History  Problem Relation Age of Onset   Breast cancer Mother    Depression Mother    Hypertension Mother    Dementia Mother    Alcoholism Father        Died in his 37's related to alcohol abuse   Leukemia Sister 8   Early death Brother        Died in a motor vehicle accident   Heart attack Daughter 77       Per patient report, specifics unknown   Diabetes Mellitus II Maternal Grandmother     Diabetes Mellitus II Paternal Grandmother    Healthy Sister    Healthy Sister    Arthritis Brother    Colon polyps Brother    Healthy Brother    Healthy Brother    Healthy Son    Breast cancer Maternal Aunt    Breast cancer Cousin    Breast cancer Maternal Aunt    Colon cancer Neg Hx    Esophageal cancer Neg Hx    Rectal cancer Neg Hx    Stomach cancer Neg Hx    Social History   Socioeconomic History   Marital status: Single    Spouse name: Not on file   Number of children: 2   Years of education: 12 grade   Highest education level: Not on file  Occupational History   Occupation: Research officer, 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[employer]    Comment: Retired  Tobacco Use   Smoking status: Former    Current packs/day: 0.00    Average packs/day: 0.2 packs/day for 48.7 years (9.7 ttl pk-yrs)    Types: Cigarettes    Start date: 12/02/1973    Quit date: 07/2022    Years since quitting: 1.6   Smokeless tobacco: Never  Vaping Use   Vaping status: Never Used  Substance and Sexual Activity   Alcohol use: Yes    Alcohol/week: 2.0 standard drinks of alcohol    Types: 1 Glasses of wine, 1 Cans of beer per week    Comment: drinks socially with friends   Drug use: No   Sexual activity: Not Currently  Other Topics Concern   Not on file  Social History Narrative   Graduated from [employer] in [place].  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      Current Social History 03/01/2021   Patient lives with granddaughter in an apartment on the first floor. There are not steps up to the entrance the patient uses.       Patient's method of transportation is personal car.      The highest level of education was some college.      The patient currently retired.      Identified important Relationships are "My brother, my son"       Pets : None, but I would like to have one       Interests / Fun: Love to read, play solitaire, Control and instrumentation engineer with sisters       Current Stressors:  My community is getting violent, strained family relationships      Religious / Personal Beliefs: Baptized [employer]       Social Drivers of Health   Financial Resource Strain: Low Risk  (03/05/2024)   Overall Financial  Resource Strain (CARDIA)    Difficulty of Paying Living Expenses: Not hard at all  Food Insecurity: No Food Insecurity (03/05/2024)   Hunger Vital Sign    Worried About Running Out of Food in the Last Year: Never true    Ran Out of Food in the Last Year: Never true  Transportation Needs: No Transportation Needs (03/05/2024)   PRAPARE - Administrator, Civil Service (Medical): No    Lack of Transportation (Non-Medical): No  Physical Activity: Insufficiently Active (03/05/2024)   Exercise Vital Sign    Days of Exercise per Week: 2 days    Minutes of Exercise per Session: 10 min  Stress: No Stress Concern Present (03/05/2024)   [employer] of Occupational Health - Occupational Stress Questionnaire    Feeling of Stress : Not at all  Social Connections: Moderately Integrated (03/05/2024)   Social Connection and Isolation Panel [NHANES]    Frequency of Communication with Friends and Family: More than three times a week    Frequency of Social Gatherings with Friends and Family: Once a week    Attends Religious Services: More than 4 times per year    Active Member of [employer] or Organizations: Yes    Attends Banker Meetings: More than 4 times per year    Marital Status: Widowed    Tobacco Counseling Counseling given: Not Answered   Clinical Intake:  Pre-visit preparation completed: Yes  Pain : No/denies pain        How often do you need to have someone help you when you read instructions, pamphlets, or other written materials from your doctor or pharmacy?: 1 - Never What is the last grade level you completed in school?: 12 GRADE  Interpreter Needed?: No  Information entered by :: [facility] Adel Neyer   Activities of Daily Living     07/03/2023    8:57 AM 06/11/2023   11:03 AM  In your present state of health, do you have any difficulty performing the following activities:  Hearing? 0 0  Vision? 0 0  Difficulty concentrating or making decisions? 0 1  Walking or climbing stairs? 1 1  Dressing or bathing? 0 0  Doing errands, shopping? 0 0    Patient Care Team: Dickie La, MD as PCP - General (Internal Medicine) Archer Asa, MD as Consulting Physician (Psychiatry) Jennye Boroughs as Counselor (Licensed Clinical Social Worker)  Indicate any recent Medical Services you may have received from other than [facility] providers in the past year (date may be approximate).     Assessment:   This is a routine wellness examination for Jennell.  Hearing/Vision screen No results found.   Goals Addressed   None   Depression Screen    03/05/2024   10:40 AM 02/26/2024    9:07 AM 07/03/2023    8:57 AM 06/11/2023   11:03 AM 04/02/2023    8:56 AM 02/12/2023    1:50 PM 02/12/2023   11:00 AM  PHQ 2/9 Scores  PHQ - 2 Score 0 0 0 0 4 2 2   PHQ- 9 Score     11 9 9     Fall Risk    03/05/2024   10:42 AM 02/26/2024    8:25 AM 07/03/2023    8:57 AM 06/11/2023   11:02 AM 04/02/2023    8:09 AM  Fall Risk   Falls in the past year? 0 0 0 0 0  Number falls in past yr: 0 0 0 0 0  Injury  with Fall? 0 0 0 0 0  Risk for fall due to : Impaired balance/gait No Fall Risks No Fall Risks No Fall Risks No Fall Risks  Follow up Falls prevention discussed;Falls evaluation completed  Falls evaluation completed;Falls prevention discussed Falls evaluation completed;Falls prevention discussed Falls evaluation completed;Falls prevention discussed    MEDICARE RISK AT HOME: Medicare Risk at Home Any stairs in or around the home?: Yes If so, are there any without handrails?: No Home free of loose throw rugs in walkways, pet beds, electrical cords, etc?: No Adequate lighting in your home to reduce risk of falls?: Yes Life alert?: No Use of a cane,  walker or w/c?: Yes Grab bars in the bathroom?: Yes Shower chair or bench in shower?: No Elevated toilet seat or a handicapped toilet?: No  TIMED UP AND GO:  Was the test performed?  No    Cognitive Function:        03/05/2024   10:44 AM 02/12/2023    1:50 PM  6CIT Screen  What Year? 0 points 0 points  What month? 0 points 0 points  What time? 0 points 0 points  Count back from 20 0 points 0 points  Months in reverse 0 points 0 points  Repeat phrase 0 points 0 points  Total Score 0 points 0 points    Immunizations Immunization History  Administered Date(s) Administered   Fluad Quad(high Dose 65+) 08/08/2022   Influenza, High Dose Seasonal PF 12/27/2021   Influenza, Mdck, Trivalent,PF 6+ MOS(egg free) 07/31/2023   Influenza,inj,Quad PF,6+ Mos 08/02/2017, 09/09/2018, 09/03/2019, 10/26/2020   PFIZER(Purple Top)SARS-COV-2 Vaccination 01/23/2020, 02/17/2020, 10/18/2020   Pneumococcal Conjugate-13 11/30/2017   Pneumococcal Polysaccharide-23 02/10/2019   Td 06/17/2010   Tdap 03/22/2021    TDAP status: Up to date  Flu Vaccine status: Up to date  Pneumococcal vaccine status: Up to date  Covid-19 vaccine status: Completed vaccines  Qualifies for Shingles Vaccine? Yes   Zostavax completed No   Shingrix Completed?: No.    Education has been provided regarding the importance of this vaccine. Patient has been advised to call insurance company to determine out of pocket expense if they have not yet received this vaccine. Advised may also receive vaccine at local pharmacy or Health Dept. Verbalized acceptance and understanding.  Screening Tests Health Maintenance  Topic Date Due   COVID-19 Vaccine (4 - 2024-25 season) 07/29/2023   MAMMOGRAM  06/13/2024   INFLUENZA VACCINE  06/27/2024   Medicare Annual Wellness (AWV)  03/05/2025   Colonoscopy  01/19/2026   DTaP/Tdap/Td (3 - Td or Tdap) 03/23/2031   Pneumonia Vaccine 24+ Years old  Completed   DEXA SCAN  Completed    Hepatitis C Screening  Completed   HPV VACCINES  Aged Out   Zoster Vaccines- Shingrix  Discontinued    Health Maintenance  Health Maintenance Due  Topic Date Due   COVID-19 Vaccine (4 - 2024-25 season) 07/29/2023    Colorectal cancer screening: Type of screening: Colonoscopy. Completed 01/19/2021. Repeat every 5 years  Mammogram status: Completed 06/14/2023. Repeat every year    Dexa Scan  : Completed  06/08/2023  Lung Cancer Screening: (Low Dose CT Chest recommended if Age 61-80 years, 20 pack-year currently smoking OR have quit w/in 15years.) does not qualify.   Lung Cancer Screening Referral: DEFERRED TO PCP  Additional Screening:  Hepatitis C Screening: does qualify; Completed 01/03/2016  Vision Screening: Recommended annual ophthalmology exams for early detection of glaucoma and other disorders of the eye. Is the patient  up to date with their annual eye exam?  No  Who is the provider or what is the name of the office in which the patient attends annual eye exams?  DOES NOT KNOW THE NAME  BUT DOES PLAN ON GOING TO THE OFFICE TO MAKE AND APPT  If pt is not established with a provider, would they like to be referred to a provider to establish care? No .   Dental Screening: Recommended annual dental exams for proper oral hygiene  Diabetic Foot Exam: N/A  Community Resource Referral / Chronic Care Management: CRR required this visit?  No   CCM required this visit?  No     Plan:     I have personally reviewed and noted the following in the patient's chart:   Medical and social history Use of alcohol, tobacco or illicit drugs  Current medications and supplements including opioid prescriptions. Patient is currently taking opioid prescriptions. Information provided to patient regarding non-opioid alternatives. Patient advised to discuss non-opioid treatment plan with their provider. Functional ability and status Nutritional status Physical activity Advanced directives List  of other physicians Hospitalizations, surgeries, and ER visits in previous 12 months Vitals Screenings to include cognitive, depression, and falls Referrals and appointments  In addition, I have reviewed and discussed with patient certain preventive protocols, quality metrics, and best practice recommendations. A written personalized care plan for preventive services as well as general preventive health recommendations were provided to patient.     Derrell Lolling, CMA   03/05/2024   After Visit Summary: (Mail) Due to this being a telephonic visit, the after visit summary with patients personalized plan was offered to patient via mail   Nurse Notes: NON FACE  TO FACE   Ms. Claudette Laws , Thank you for taking time to come for your Medicare Wellness Visit. I appreciate your ongoing commitment to your health goals. Please review the following plan we discussed and let me know if I can assist you in the future.   These are the goals we discussed:  Goals       Blood Pressure < 140/90      improve relationships with family members (pt-stated)      Increase physical activity      Begin seated and standing exercises with exercise band to increase strength and balance 3 times a week for 20 minutes each time       Quit smoking / using tobacco        This is a list of the screening recommended for you and due dates:  Health Maintenance  Topic Date Due   COVID-19 Vaccine (4 - 2024-25 season) 07/29/2023   Mammogram  06/13/2024   Flu Shot  06/27/2024   Medicare Annual Wellness Visit  03/05/2025   Colon Cancer Screening  01/19/2026   DTaP/Tdap/Td vaccine (3 - Td or Tdap) 03/23/2031   Pneumonia Vaccine  Completed   DEXA scan (bone density measurement)  Completed   Hepatitis C Screening  Completed   HPV Vaccine  Aged Out   Zoster (Shingles) Vaccine  Discontinued

## 2024-03-05 NOTE — Telephone Encounter (Signed)
 Good Morning ,                                    I have completed pt AWV  and sent it to you to sign off on .Marland Kitchen Thanks in advance

## 2024-03-10 ENCOUNTER — Ambulatory Visit (INDEPENDENT_AMBULATORY_CARE_PROVIDER_SITE_OTHER): Admitting: Psychology

## 2024-03-10 DIAGNOSIS — F3340 Major depressive disorder, recurrent, in remission, unspecified: Secondary | ICD-10-CM | POA: Diagnosis not present

## 2024-03-10 DIAGNOSIS — F325 Major depressive disorder, single episode, in full remission: Secondary | ICD-10-CM

## 2024-03-10 NOTE — Progress Notes (Signed)
 [facility] Counselor/Therapist Progress Note  Patient ID: Mia Underwood, MRN: [id number]   Date: 03/10/24  Time Spent: 8:08  am - 9:00 am : 52 Minutes  Treatment Type: Individual Therapy.  Reported Symptoms:  depression and anxiety.   Mental Status Exam: Appearance:  Casual     Behavior: Appropriate  Motor: Normal  Speech/Language:  Clear and Coherent  Affect: Congruent  Mood: dysthymic  Thought process: normal  Thought content:   WNL  Sensory/Perceptual disturbances:   WNL  Orientation: oriented to person, place, time/date, and situation  Attention: Good  Concentration: Good  Memory: WNL  Fund of knowledge:  Good  Insight:   Good  Judgment:  Good  Impulse Control: Good   Risk Assessment: Danger to Self:  No Self-injurious Behavior: No Danger to Others: No Duty to Warn:no Physical Aggression / Violence:No  Access to Firearms a concern: No  Gang Involvement:No   Subjective:   Mia Underwood participated from home, via video and consented to treatment. Therapist participated from office.  Karmel reviewed the events of the past week. She noted having a medication change and was prescribed Doxepine for sleep. She noted this going well, thus far. She noted interpersonal stressors with her grand daughter, who lives with her, and noted her hope that he grand daughter moves out. She noted that her grand daughter has been living with her for ~ 8 years, since Kristine's daughter's passing. She noted her grand daughter often being antagonistic and displacing her anger towards her. She noted her grand daughter's history of agreeing to do tasks around the home and then not following up on said tasks. She noted her attempts, late last year, to address these issues, to no avail.  She noted informing her grand daughter that if her behavior does not improve, she will need to move out. She noted some improvement but noted this not lasting long. We worked on exploring this during  the session. Malak noted feelings of frustration and feeling a lack of love. She noted a need for her grand-daughter to leave the family home. We worked on processing her feelings, how this strain effects her mood, and her frustration regarding her attempts to address these issues. We will work on identifying ways to communicate to her grand daughter the need to move out and setting a timeline during our follow-up. Vista was engaged and motivated during the session. She expressed commitment towards goals. Therapist praised Leeanne for her effort and provided supportive therapy.    Interventions: CBT & interpersonal.   Diagnosis:  Major depression in remission [facility])  Psychiatric Treatment: Yes , Dr. Levie Ream. See Chart.    Treatment Plan:  Client Abilities/Strengths Nekisha is forthcoming and motivated for change.   Support System:  Family and friends.   Client Treatment Preferences OPT  Client Statement of Needs Katie would like to improve frustration tolerance, manage interpersonal stressors, reduce avoidance, engage socially, manage symptoms proactively, process past events, verbalize thoughts and feelings, build a positive supportive system, engage in enjoyable activities, increase mindfulness, manage anxiety regarding driving (other drivers).   Treatment Level Weekly  Symptoms  Anxiety: feeling anxious, difficulty managing worry, worrying too much about different things, difficulty relaxing, easily annoyed or irritable, feelings afraid something awful might happen.    (Status: maintained) Depression:  Feeling down, lethargy, poor appetite, difficulty concentrating, psychomotor retardation.   (Status: maintained)  Goals:   Hermelinda experiences symptoms of depression.   Treatment plan signed and available on s-drive:  No,  pending.    Target Date: 02/04/25 Frequency: Weekly  Progress: 0 Modality: individual    Therapist will provide referrals for additional resources as  appropriate.  Therapist will provide psycho-education regarding Aundrea's diagnosis and corresponding treatment approaches and interventions. Belva Boyden, LCSW will support the patient's ability to achieve the goals identified. will employ CBT, BA, Problem-solving, Solution Focused, Mindfulness,  coping skills, & other evidenced-based practices will be used to promote progress towards healthy functioning to help manage decrease symptoms associated with her diagnosis.   Reduce overall level, frequency, and intensity of the feelings of depression, anxiety and panic evidenced by decreased overall symptoms from 6 to 7 days/week to 0 to 1 days/week per client report for at least 3 consecutive months. Verbally express understanding of the relationship between feelings of depression, anxiety and their impact on thinking patterns and behaviors. Verbalize an understanding of the role that distorted thinking plays in creating fears, excessive worry, and ruminations.    [name] participated in the creation of the treatment plan)   Belva Boyden, LCSW

## 2024-03-19 ENCOUNTER — Encounter: Payer: Self-pay | Admitting: Internal Medicine

## 2024-03-24 ENCOUNTER — Ambulatory Visit
Admission: RE | Admit: 2024-03-24 | Discharge: 2024-03-24 | Disposition: A | Discharge status: Home / Self Care | Source: Ambulatory Visit | Attending: Internal Medicine

## 2024-03-24 ENCOUNTER — Ambulatory Visit (INDEPENDENT_AMBULATORY_CARE_PROVIDER_SITE_OTHER): Admitting: Psychology

## 2024-03-24 DIAGNOSIS — F17200 Nicotine dependence, unspecified, uncomplicated: Secondary | ICD-10-CM

## 2024-03-24 DIAGNOSIS — F17210 Nicotine dependence, cigarettes, uncomplicated: Secondary | ICD-10-CM | POA: Diagnosis not present

## 2024-03-24 DIAGNOSIS — F324 Major depressive disorder, single episode, in partial remission: Secondary | ICD-10-CM

## 2024-03-24 DIAGNOSIS — Z122 Encounter for screening for malignant neoplasm of respiratory organs: Secondary | ICD-10-CM | POA: Diagnosis not present
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
## 2024-04-02 ENCOUNTER — Ambulatory Visit (INDEPENDENT_AMBULATORY_CARE_PROVIDER_SITE_OTHER): Admitting: Psychology

## 2024-04-02 DIAGNOSIS — F324 Major depressive disorder, single episode, in partial remission: Secondary | ICD-10-CM

## 2024-04-02 NOTE — Progress Notes (Signed)
 [facility] Counselor/Therapist Progress Note  Patient ID: RANDELL SALAIS, MRN: [id number]   Date: 04/02/24  Time Spent: 2:02 pm - 2:39 pm : 37 Minutes  Treatment Type: Individual Therapy.  Reported Symptoms:  depression and anxiety.   Mental Status Exam: Appearance:  Casual     Behavior: Appropriate  Motor: Normal  Speech/Language:  Clear and Coherent  Affect: Tearful  Mood: dysthymic  Thought process: normal  Thought content:   WNL  Sensory/Perceptual disturbances:   WNL  Orientation: oriented to person, place, time/date, and situation  Attention: Good  Concentration: Good  Memory: WNL  Fund of knowledge:  Good  Insight:   Good  Judgment:  Good  Impulse Control: Good   Risk Assessment: Danger to Self:  No Self-injurious Behavior: No Danger to Others: No Duty to Warn:no Physical Aggression / Violence:No  Access to Firearms a concern: No  Gang Involvement:No   Subjective:   YANCY ZUBIETA participated from home, via video and consented to treatment. Therapist participated from home office.  Jazlynne reviewed the events of the past week. Javiah noted her recent decision to be supportive of her grand-daughter while maintaining some distance and boundaries. She noted this decision coming at the heals of the realization that the dynamics have been affecting her mood and health. She noted being resolute in this decision and hopeful that this will improve the overall dynamics. She noted "its best to stay my distance". She noted identifying a lack of control regarding her grand daughter's behavior. She described her grand-daughter is unappreciative and that she does not reciprocate. She noted previously drinking alcohol to manage these stressors. We worked on processing this during the session and the stress she experiences due to this relationship. She noted her focus on enjoyable activities. Therapist praised Kayline for her mindfulness regarding her stress level and  identifying her maladaptive coping. Additionally, therapist praised Lataja for her effort to set and maintain boundaries and engage in self-care. Therapist validated Eli's feelings and experience and provided supportive therapy. A Follow-up was scheduled for continued treatment which she benefits from.    Interventions: CBT & interpersonal.   Diagnosis:  Major depressive disorder with single episode, in partial remission [facility])  Psychiatric Treatment: Yes , Dr. Levie Ream. See Chart.    Treatment Plan:  Client Abilities/Strengths Dilys is forthcoming and motivated for change.   Support System:  Family and friends.   Client Treatment Preferences OPT  Client Statement of Needs Kaprisha would like to improve frustration tolerance, manage interpersonal stressors, reduce avoidance, engage socially, manage symptoms proactively, process past events, verbalize thoughts and feelings, build a positive supportive system, engage in enjoyable activities, increase mindfulness, manage anxiety regarding driving (other drivers).   Treatment Level Weekly  Symptoms  Anxiety: feeling anxious, difficulty managing worry, worrying too much about different things, difficulty relaxing, easily annoyed or irritable, feelings afraid something awful might happen.    (Status: maintained) Depression:  Feeling down, lethargy, poor appetite, difficulty concentrating, psychomotor retardation.   (Status: maintained)  Goals:   Dannetta experiences symptoms of depression.   Treatment plan signed and available on s-drive:  No, pending.    Target Date: 02/04/25 Frequency: Weekly  Progress: 0 Modality: individual    Therapist will provide referrals for additional resources as appropriate.  Therapist will provide psycho-education regarding Analeigh's diagnosis and corresponding treatment approaches and interventions. Belva Boyden, LCSW will support the patient's ability to achieve the goals identified. will employ CBT, BA,  Problem-solving, Solution Focused, Mindfulness,  coping  skills, & other evidenced-based practices will be used to promote progress towards healthy functioning to help manage decrease symptoms associated with her diagnosis.   Reduce overall level, frequency, and intensity of the feelings of depression, anxiety and panic evidenced by decreased overall symptoms from 6 to 7 days/week to 0 to 1 days/week per client report for at least 3 consecutive months. Verbally express understanding of the relationship between feelings of depression, anxiety and their impact on thinking patterns and behaviors. Verbalize an understanding of the role that distorted thinking plays in creating fears, excessive worry, and ruminations.    [name] participated in the creation of the treatment plan)   Belva Boyden, LCSW

## 2024-04-11 ENCOUNTER — Other Ambulatory Visit: Payer: Self-pay | Admitting: Internal Medicine

## 2024-04-11 ENCOUNTER — Other Ambulatory Visit: Payer: Self-pay | Admitting: Student

## 2024-04-11 ENCOUNTER — Other Ambulatory Visit (HOSPITAL_COMMUNITY): Payer: Self-pay | Admitting: Psychiatry

## 2024-04-11 DIAGNOSIS — I10 Essential (primary) hypertension: Secondary | ICD-10-CM

## 2024-04-11 DIAGNOSIS — R252 Cramp and spasm: Secondary | ICD-10-CM

## 2024-04-11 DIAGNOSIS — J302 Other seasonal allergic rhinitis: Secondary | ICD-10-CM

## 2024-04-14 NOTE — Telephone Encounter (Signed)
 Medication sent to pharmacy

## 2024-04-16 ENCOUNTER — Ambulatory Visit (INDEPENDENT_AMBULATORY_CARE_PROVIDER_SITE_OTHER): Admitting: Psychology

## 2024-04-16 DIAGNOSIS — F324 Major depressive disorder, single episode, in partial remission: Secondary | ICD-10-CM

## 2024-04-16 NOTE — Progress Notes (Signed)
 [facility] Counselor/Therapist Progress Note  Patient ID: Mia Underwood, MRN: [id number]   Date: 04/16/24  Time Spent: 2:08 pm - 2:55 pm : 47 Minutes  Treatment Type: Individual Therapy.  Reported Symptoms:  Underwood and anxiety.   Mental Status Exam: Appearance:  Casual     Behavior: Appropriate  Motor: Normal  Speech/Language:  Clear and Coherent  Affect: Congruent  Mood: normal  Thought process: normal  Thought content:   WNL  Sensory/Perceptual disturbances:   WNL  Orientation: oriented to person, place, time/date, and situation  Attention: Good  Concentration: Good  Memory: WNL  Fund of knowledge:  Good  Insight:   Good  Judgment:  Good  Impulse Control: Good   Risk Assessment: Danger to Self:  No Self-injurious Behavior: No Danger to Others: No Duty to Warn:no Physical Aggression / Violence:No  Access to Firearms a concern: No  Gang Involvement:No   Subjective:   Mia Underwood participated from home, via video and consented to treatment. Therapist participated from home office.  Mia Underwood reviewed the events of the past week. She noted her effort to maintain her boundaries and not engage with antagonistic family members. Additionally, she noted having to maintain boundaries with others outside the family, as well.  She noted that "its okay to say no". She noted her efforts to stay busy and noted her efforts to drink less noted that she is drinking the occasional beer. She noted previously drinking ~6 drinks on average when she does drink and now is currently drinking ~2 drinks She noted her effort to stay active and noted gardening, playing games, keep her home organized, and watching her favorite programs. She noted a lifetime of being "stressed" since age 46 and noted during childhood "loving the rain" because she could go outside and cry and people wouldn't be able to tell. She noted having responsibility from age 68 and that this hasn't changed since  that time. She noted her parents often argue in front of her and noted this being devastating. We worked on exploring this during the session. Therapist validated Mia Underwood's feelings and experience. Therapist praised Mia Underwood for her effort during the session. Therapist provided supportive therapy. A follow-up was scheduled for continued treatment which she benefits from.  Interventions: CBT & interpersonal.   Diagnosis:  Major depressive disorder with single episode, in partial remission [facility])  Psychiatric Treatment: Yes , Dr. Levie Ream. See Chart.    Treatment Plan:  Client Abilities/Strengths Mia Underwood is forthcoming and motivated for change.   Support System:  Family and friends.   Client Treatment Preferences OPT  Client Statement of Needs Mia Underwood would like to improve frustration tolerance, manage interpersonal stressors, reduce avoidance, engage socially, manage symptoms proactively, process past events, verbalize thoughts and feelings, build a positive supportive system, engage in enjoyable activities, increase mindfulness, manage anxiety regarding driving (other drivers).   Treatment Level Weekly  Symptoms  Anxiety: feeling anxious, difficulty managing worry, worrying too much about different things, difficulty relaxing, easily annoyed or irritable, feelings afraid something awful might happen.    (Status: maintained) Underwood:  Feeling down, lethargy, poor appetite, difficulty concentrating, psychomotor retardation.   (Status: maintained)  Goals:   Mia Underwood.   Treatment plan signed and available on s-drive:  No, pending.    Target Date: 02/04/25 Frequency: Weekly  Progress: 0 Modality: individual    Therapist will provide referrals for additional resources as appropriate.  Therapist will provide psycho-education regarding Mia Underwood's diagnosis and corresponding treatment approaches  and interventions. Mia Boyden, LCSW will support the patient's  ability to achieve the goals identified. will employ CBT, BA, Problem-solving, Solution Focused, Mindfulness,  coping skills, & other evidenced-based practices will be used to promote progress towards healthy functioning to help manage decrease symptoms associated with her diagnosis.   Reduce overall level, frequency, and intensity of the feelings of Underwood, anxiety and panic evidenced by decreased overall symptoms from 6 to 7 days/week to 0 to 1 days/week per client report for at least 3 consecutive months. Verbally express understanding of the relationship between feelings of Underwood, anxiety and their impact on thinking patterns and behaviors. Verbalize an understanding of the role that distorted thinking plays in creating fears, excessive worry, and ruminations.    Mia Underwood participated in the creation of the treatment plan)   Mia Boyden, LCSW

## 2024-04-30 ENCOUNTER — Ambulatory Visit (INDEPENDENT_AMBULATORY_CARE_PROVIDER_SITE_OTHER): Admitting: Psychology

## 2024-04-30 DIAGNOSIS — F324 Major depressive disorder, single episode, in partial remission: Secondary | ICD-10-CM | POA: Diagnosis not present

## 2024-04-30 NOTE — Progress Notes (Signed)
 [facility] Counselor/Therapist Progress Note  Patient ID: Mia Underwood, MRN: [id number]   Date: 04/30/24  Time Spent: 2:03 pm - 2:48 pm : 45 Minutes  Treatment Type: Individual Therapy.  Reported Symptoms:  depression and anxiety.   Mental Status Exam: Appearance:  Casual     Behavior: Appropriate  Motor: Normal  Speech/Language:  Clear and Coherent  Affect: Congruent  Mood: irritable  Thought process: normal  Thought content:   WNL  Sensory/Perceptual disturbances:   WNL  Orientation: oriented to person, place, time/date, and situation  Attention: Good  Concentration: Good  Memory: WNL  Fund of knowledge:  Good  Insight:   Good  Judgment:  Good  Impulse Control: Good   Risk Assessment: Danger to Self:  No Self-injurious Behavior: No Danger to Others: No Duty to Warn:no Physical Aggression / Violence:No  Access to Firearms a concern: No  Gang Involvement:No   Subjective:   Mia Underwood participated from home, via video and consented to treatment. Therapist participated from home office.  Mia Underwood reviewed the events of the past week. She noted a rise of interpersonal stressors with her grand daughter who currently lives with her. She noted noted her grand daughter being inconsiderate of Mia Underwood and the sound level at night. She noted a history of similar behavior and noted her consideration to have her grand-daughter to move out. Mia Underwood noted frustration regarding her grand-daughter's lack of transparency and general decision-making.  We worked on processing her feelings and experience during the session. We reviewed conflict resolution skills during the session. Mia Underwood was engaged and motivated during the session. Therapist validated Mia Underwood's feelings and experience during the session. A follow-up was scheduled for continued treatment, which she benefits from.   Interventions: CBT & interpersonal.   Diagnosis:  Major depressive disorder with single episode,  in partial remission [facility])  Psychiatric Treatment: Yes , Dr. Levie Ream. See Chart.    Treatment Plan:  Client Abilities/Strengths Mia Underwood is forthcoming and motivated for change.   Support System:  Family and friends.   Client Treatment Preferences OPT  Client Statement of Needs Mia Underwood would like to improve frustration tolerance, manage interpersonal stressors, reduce avoidance, engage socially, manage symptoms proactively, process past events, verbalize thoughts and feelings, build a positive supportive system, engage in enjoyable activities, increase mindfulness, manage anxiety regarding driving (other drivers).   Treatment Level Weekly  Symptoms  Anxiety: feeling anxious, difficulty managing worry, worrying too much about different things, difficulty relaxing, easily annoyed or irritable, feelings afraid something awful might happen.    (Status: maintained) Depression:  Feeling down, lethargy, poor appetite, difficulty concentrating, psychomotor retardation.   (Status: maintained)  Goals:   Mia Underwood experiences symptoms of depression.   Treatment plan signed and available on s-drive:  No, pending.    Target Date: 02/04/25 Frequency: Weekly  Progress: 0 Modality: individual    Therapist will provide referrals for additional resources as appropriate.  Therapist will provide psycho-education regarding Mia Underwood's diagnosis and corresponding treatment approaches and interventions. Belva Boyden, LCSW will support the patient's ability to achieve the goals identified. will employ CBT, BA, Problem-solving, Solution Focused, Mindfulness,  coping skills, & other evidenced-based practices will be used to promote progress towards healthy functioning to help manage decrease symptoms associated with her diagnosis.   Reduce overall level, frequency, and intensity of the feelings of depression, anxiety and panic evidenced by decreased overall symptoms from 6 to 7 days/week to 0 to 1 days/week per  client report for at least 3 consecutive  months. Verbally express understanding of the relationship between feelings of depression, anxiety and their impact on thinking patterns and behaviors. Verbalize an understanding of the role that distorted thinking plays in creating fears, excessive worry, and ruminations.    [name] participated in the creation of the treatment plan)   Belva Boyden, LCSW

## 2024-05-14 ENCOUNTER — Ambulatory Visit (INDEPENDENT_AMBULATORY_CARE_PROVIDER_SITE_OTHER): Admitting: Psychology

## 2024-05-14 DIAGNOSIS — F324 Major depressive disorder, single episode, in partial remission: Secondary | ICD-10-CM

## 2024-05-14 NOTE — Progress Notes (Unsigned)
 [facility] Counselor/Therapist Progress Note  Patient ID: Mia Underwood, MRN: [id number]   Date: 05/14/24  Time Spent: 2:05 pm - 2:39 pm : 34 Minutes  Treatment Type: Individual Therapy.  Reported Symptoms:  depression and anxiety.   Mental Status Exam: Appearance:  Casual     Behavior: Appropriate  Motor: Normal  Speech/Language:  Clear and Coherent  Affect: Congruent  Mood: irritable  Thought process: normal  Thought content:   WNL  Sensory/Perceptual disturbances:   WNL  Orientation: oriented to person, place, time/date, and situation  Attention: Good  Concentration: Good  Memory: WNL  Fund of knowledge:  Good  Insight:   Good  Judgment:  Good  Impulse Control: Good   Risk Assessment: Danger to Self:  No Self-injurious Behavior: No Danger to Others: No Duty to Warn:no Physical Aggression / Violence:No  Access to Firearms a concern: No  Gang Involvement:No   Subjective:   Mia Underwood participated from home, via video and consented to treatment. Therapist participated from home office.  Mia Underwood reviewed the events of the past week. She noted various stressors related to malfunctioning appliances and noted this resulting in significant stress and noted feeling depressed for sometime until this resolved early this week. We worked on processing this during the session. She reflected upon various childhood events and the effect of this on her mood and outlook. She noted her effort to manage her stress, put things into perspective, and manage her frustration. She reflected on various frustrations with family members, particularly her grandchildren, and noted feeling a lack of appreciation, acknowledgement of her sacrifice, and lack of engagement in positive and respectful communication. We worked on processing this during the session. Therapist validated Mia Underwood's feelings and experience, normalized her experience, and encouraged continued self-care, boundaries,  and assertiveness. Mia Underwood was engaged and motivated during the session. She expressed commitment towards goals. Therapist praised Mia Underwood and provided supportive therapy. A follow-up was scheduled for continued treatment which she benefits from.   Interventions: CBT & interpersonal.   Diagnosis:  Major depressive disorder with single episode, in partial remission [facility])  Psychiatric Treatment: Yes , Dr. Levie Ream. See Chart.    Treatment Plan:  Client Abilities/Strengths Mia Underwood is forthcoming and motivated for change.   Support System:  Family and friends.   Client Treatment Preferences OPT  Client Statement of Needs Mia Underwood would like to improve frustration tolerance, manage interpersonal stressors, reduce avoidance, engage socially, manage symptoms proactively, process past events, verbalize thoughts and feelings, build a positive supportive system, engage in enjoyable activities, increase mindfulness, manage anxiety regarding driving (other drivers).   Treatment Level Weekly  Symptoms  Anxiety: feeling anxious, difficulty managing worry, worrying too much about different things, difficulty relaxing, easily annoyed or irritable, feelings afraid something awful might happen.    (Status: maintained) Depression:  Feeling down, lethargy, poor appetite, difficulty concentrating, psychomotor retardation.   (Status: maintained)  Goals:   Mia Underwood experiences symptoms of depression.   Treatment plan signed and available on s-drive:  No, pending.    Target Date: 02/04/25 Frequency: Weekly  Progress: 0 Modality: individual    Therapist will provide referrals for additional resources as appropriate.  Therapist will provide psycho-education regarding Mia Underwood's diagnosis and corresponding treatment approaches and interventions. Belva Boyden, LCSW will support the patient's ability to achieve the goals identified. will employ CBT, BA, Problem-solving, Solution Focused, Mindfulness,  coping skills,  & other evidenced-based practices will be used to promote progress towards healthy functioning to help manage decrease symptoms  associated with her diagnosis.   Reduce overall level, frequency, and intensity of the feelings of depression, anxiety and panic evidenced by decreased overall symptoms from 6 to 7 days/week to 0 to 1 days/week per client report for at least 3 consecutive months. Verbally express understanding of the relationship between feelings of depression, anxiety and their impact on thinking patterns and behaviors. Verbalize an understanding of the role that distorted thinking plays in creating fears, excessive worry, and ruminations.    [name] participated in the creation of the treatment plan)   Belva Boyden, LCSW

## 2024-06-03 ENCOUNTER — Ambulatory Visit (INDEPENDENT_AMBULATORY_CARE_PROVIDER_SITE_OTHER): Admitting: Psychology

## 2024-06-03 DIAGNOSIS — F324 Major depressive disorder, single episode, in partial remission: Secondary | ICD-10-CM

## 2024-06-03 NOTE — Progress Notes (Signed)
 [facility] Counselor/Therapist Progress Note  Patient ID: BRIGET SHAHEED, MRN: [id number]   Date: 06/03/24  Time Spent: 4:07 pm - 4:38 pm : 31 Minutes  Treatment Type: Individual Therapy.  Reported Symptoms:  depression and anxiety.   Mental Status Exam: Appearance:  Casual     Behavior: Appropriate  Motor: Normal  Speech/Language:  Clear and Coherent  Affect: Congruent  Mood: dysthymic  Thought process: normal  Thought content:   WNL  Sensory/Perceptual disturbances:   WNL  Orientation: oriented to person, place, time/date, and situation  Attention: Good  Concentration: Good  Memory: WNL  Fund of knowledge:  Good  Insight:   Good  Judgment:  Good  Impulse Control: Good   Risk Assessment: Danger to Self:  No Self-injurious Behavior: No Danger to Others: No Duty to Warn:no Physical Aggression / Violence:No  Access to Firearms a concern: No  Gang Involvement:No   Subjective:   MALONE ADMIRE participated from home, via video and consented to treatment. Therapist participated from office.  Tamiya reviewed the events of the past week. Nadie noted doing okay but noted increased frustration regarding the use of technology. She noted stress related to unexpected costs of medical care due to a increase in her deductible. She noted her SNAP benefits being reduced significantly, as well. She noted frustration regarding this. She noted this affecting her greatly in relation to her finances and ability to pay for medical care. She noted her intent to switch insurance providers. We worked on exploring her frustrations. Aura noted feeling depressed last week and her effort to avoid engaging with others. We works on exploring this and will continue to do so going forward. She noted feeling less depressed since that time. Therapist validated Khala's feelings and experience and provided supportive therapy. A follow-up was scheduled for continued treatment which Lakeyta benefits  from.   Interventions: CBT  Diagnosis:  Major depressive disorder with single episode, in partial remission [facility])  Psychiatric Treatment: Yes , Dr. Tasia. See Chart.    Treatment Plan:  Client Abilities/Strengths Kayslee is forthcoming and motivated for change.   Support System:  Family and friends.   Client Treatment Preferences OPT  Client Statement of Needs Remmie would like to improve frustration tolerance, manage interpersonal stressors, reduce avoidance, engage socially, manage symptoms proactively, process past events, verbalize thoughts and feelings, build a positive supportive system, engage in enjoyable activities, increase mindfulness, manage anxiety regarding driving (other drivers).   Treatment Level Weekly  Symptoms  Anxiety: feeling anxious, difficulty managing worry, worrying too much about different things, difficulty relaxing, easily annoyed or irritable, feelings afraid something awful might happen.    (Status: maintained) Depression:  Feeling down, lethargy, poor appetite, difficulty concentrating, psychomotor retardation.   (Status: maintained)  Goals:   Daelynn experiences symptoms of depression.   Treatment plan signed and available on s-drive:  No, pending.    Target Date: 02/04/25 Frequency: Weekly  Progress: 0 Modality: individual    Therapist will provide referrals for additional resources as appropriate.  Therapist will provide psycho-education regarding Niambi's diagnosis and corresponding treatment approaches and interventions. [name], LCSW will support the patient's ability to achieve the goals identified. will employ CBT, BA, Problem-solving, Solution Focused, Mindfulness,  coping skills, & other evidenced-based practices will be used to promote progress towards healthy functioning to help manage decrease symptoms associated with her diagnosis.   Reduce overall level, frequency, and intensity of the feelings of depression, anxiety and  panic evidenced by decreased overall  symptoms from 6 to 7 days/week to 0 to 1 days/week per client report for at least 3 consecutive months. Verbally express understanding of the relationship between feelings of depression, anxiety and their impact on thinking patterns and behaviors. Verbalize an understanding of the role that distorted thinking plays in creating fears, excessive worry, and ruminations.    [name] participated in the creation of the treatment plan)   [name], LCSW

## 2024-06-04 ENCOUNTER — Ambulatory Visit (HOSPITAL_BASED_OUTPATIENT_CLINIC_OR_DEPARTMENT_OTHER): Admitting: Psychiatry

## 2024-06-04 ENCOUNTER — Encounter (HOSPITAL_COMMUNITY): Payer: Self-pay | Admitting: Psychiatry

## 2024-06-04 ENCOUNTER — Other Ambulatory Visit: Payer: Self-pay

## 2024-06-04 VITALS — BP 184/81 | HR 66 | Ht 61.0 in | Wt 155.0 lb

## 2024-06-04 DIAGNOSIS — F325 Major depressive disorder, single episode, in full remission: Secondary | ICD-10-CM | POA: Diagnosis not present

## 2024-06-04 MED ORDER — SERTRALINE HCL 100 MG PO TABS: ORAL_TABLET | ORAL | 5 refills | Status: DC

## 2024-06-04 MED ORDER — DOXEPIN HCL 25 MG PO CAPS: 25.0000 mg | ORAL_CAPSULE | Freq: Every day | ORAL | 6 refills | Status: DC

## 2024-06-04 NOTE — Progress Notes (Signed)
 Psychiatric Initial Adult Assessment   Patient Identification: Mia Underwood MRN:  [id number] Date of Evaluation:  06/04/2024 Referral Source: Mia Underwood Chief Complaint: Clinical dg along well as best she can. Virtual Visit via Telephone Note   Today the patient is doing well.  She stays active.  She is sleeping and eating well.  She watches TV a lot.  She plays back and medicines and other card games by herself.  The patient is also drawing.  He shows no vegetative symptoms.  She drinks no alcohol and uses no drugs.  She takes her medications regularly.  She is presently in therapy and is connected with her therapist.  Patient is very stable.  She is functioning well.   I connected with Mia Underwood on 06/04/24 at  3:30 PM EDT by telephone and verified that I am speaking with the correct person using two identifiers.  Location: Patient: home Provider: office   I discussed the limitations, risks, security and privacy concerns of performing an evaluation and management service by telephone and the availability of in person appointments. I also discussed with the patient that there may be a patient responsible charge related to this service. The patient expressed understanding and agreed to proceed.  ructions:    I discussed the assessment and treatment plan with the patient. The patient was provided an opportunity to ask questions and all were answered. The patient agreed with the plan and demonstrated an understanding of the instructions.   The patient was advised to call back or seek an in-person evaluation if the symptoms worsen or if the condition fails to improve as anticipated.  I provided 15 minutes of non-face-to-face time during this encounter.   Elna LILLETTE Lo, MD  Past Psychiatric History:   Previous Psychotropic Medications: Yes   Substance Abuse History in the last 12 months:  Yes.    Consequences of Substance Abuse:   Past Medical History:  Past Medical  History:  Diagnosis Date   Allergic rhinitis 05/04/2016   Arthritis    Bilateral carpal tunnel syndrome 10/18/2010   Left > Right    Colles' fracture of left radius 07/01/2019   Diverticulosis of sigmoid colon 06/07/2010   Essential hypertension 09/14/2009   Healthcare maintenance 01/03/2016   Hyperlipidemia 09/14/2009   Insomnia secondary to depression with anxiety 05/04/2016   Major depression, recurrent, chronic (HCC) 09/14/2009   Marijuana abuse 04/04/2016   Muscle spasm of left shoulder 02/03/2019   Tobacco abuse 01/17/2016   Upper respiratory infection, viral 02/03/2019   Vitamin D  deficiency 04/04/2016    Past Surgical History:  Procedure Laterality Date   ABDOMINAL HYSTERECTOMY  April 1996   For fibroids   COLONOSCOPY  07/18/2010   Patterson-tic's    Family Psychiatric History:   Family History:  Family History  Problem Relation Age of Onset   Breast cancer Mother    Depression Mother    Hypertension Mother    Dementia Mother    Alcoholism Father        Died in his 25's related to alcohol abuse   Leukemia Sister 8   Early death Brother        Died in a Librarian, academic accident   Heart attack Daughter 60       Per patient report, specifics unknown   Diabetes Mellitus II Maternal Grandmother    Diabetes Mellitus II Paternal Grandmother    Healthy Sister    Healthy Sister    Arthritis Brother  Colon polyps Brother    Healthy Brother    Healthy Brother    Healthy Son    Breast cancer Maternal Aunt    Breast cancer Cousin    Breast cancer Maternal Aunt    Colon cancer Neg Hx    Esophageal cancer Neg Hx    Rectal cancer Neg Hx    Stomach cancer Neg Hx     Social History:   Social History   Socioeconomic History   Marital status: Single    Spouse name: Not on file   Number of children: 2   Years of education: 12 grade   Highest education level: Not on file  Occupational History   Occupation: Research officer, 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[employer]    Comment: Retired   Tobacco Use   Smoking status: Former    Current packs/day: 0.00    Average packs/day: 0.2 packs/day for 48.7 years (9.7 ttl pk-yrs)    Types: Cigarettes    Start date: 12/02/1973    Quit date: 07/2022    Years since quitting: 1.8   Smokeless tobacco: Never  Vaping Use   Vaping status: Never Used  Substance and Sexual Activity   Alcohol use: Yes    Alcohol/week: 2.0 standard drinks of alcohol    Types: 1 Glasses of wine, 1 Cans of beer per week    Comment: drinks socially with friends   Drug use: No   Sexual activity: Not Currently  Other Topics Concern   Not on file  Social History Narrative   Graduated from [employer] in [place].  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      Current Social History 03/01/2021   Patient lives with granddaughter in an apartment on the first floor. There are not steps up to the entrance the patient uses.       Patient's method of transportation is personal car.      The highest level of education was some college.      The patient currently retired.      Identified important Relationships are My brother, my son       Pets : None, but I would like to have one       Interests / Fun: Love to read, play Copywriter, advertising, Control and instrumentation engineer with sisters       Current Stressors: My community is getting violent, strained family relationships      Religious / Personal Beliefs: Baptized [employer]       Social Drivers of Corporate investment banker Strain: Low Risk  (03/05/2024)   Overall Financial Resource Strain (CARDIA)    Difficulty of Paying Living Expenses: Not hard at all  Food Insecurity: No Food Insecurity (03/05/2024)   Hunger Vital Sign    Worried About Running Out of Food in the Last Year: Never true    Ran Out of Food in the Last Year: Never true  Transportation Needs: No Transportation Needs (03/05/2024)   PRAPARE - Administrator, Civil Service (Medical): No    Lack of Transportation  (Non-Medical): No  Physical Activity: Insufficiently Active (03/05/2024)   Exercise Vital Sign    Days of Exercise per Week: 2 days    Minutes of Exercise per Session: 10 min  Stress: No Stress Concern Present (03/05/2024)   [employer] of Occupational Health - Occupational Stress Questionnaire    Feeling of Stress : Not at all  Social Connections: Moderately Integrated (  03/05/2024)   Social Connection and Isolation Panel    Frequency of Communication with Friends and Family: More than three times a week    Frequency of Social Gatherings with Friends and Family: Once a week    Attends Religious Services: More than 4 times per year    Active Member of [employer] or Organizations: Yes    Attends Banker Meetings: More than 4 times per year    Marital Status: Widowed    Additional Social History:   Allergies:   Allergies  Allergen Reactions   Penicillins Hives   Naproxen Rash    Pt states she takes Aleve without problems    Metabolic Disorder Labs: Lab Results  Component Value Date   HGBA1C 6.1 (H) 06/11/2023   No results found for: PROLACTIN Lab Results  Component Value Date   CHOL 168 02/26/2024   TRIG 137 02/26/2024   HDL 50 02/26/2024   CHOLHDL 3.4 02/26/2024   VLDL 20 11/01/2010   LDLCALC 94 02/26/2024   LDLCALC 96 04/04/2022     Current Medications: Current Outpatient Medications  Medication Sig Dispense Refill   amLODipine -olmesartan  (AZOR ) 10-40 MG tablet TAKE 1 TABLET BY MOUTH DAILY. 90 tablet 3   cetirizine  (ZYRTEC ) 10 MG tablet TAKE 1 TABLET(10 MG) BY MOUTH DAILY 90 tablet 3   Cholecalciferol  (VITAMIN D3) 10 MCG (400 UNIT) tablet Take 400 Units by mouth daily.     cyanocobalamin  (VITAMIN B12) 1000 MCG tablet Take 1 tablet (1,000 mcg total) by mouth daily. 90 tablet 3   diclofenac  Sodium (VOLTAREN ) 1 % GEL Apply 4 g topically 4 (four) times daily. 150 g 3   gabapentin  (NEURONTIN ) 600 MG tablet TAKE 1 TABLET(600 MG) BY MOUTH DAILY 90 tablet 3    hydrochlorothiazide  (HYDRODIURIL ) 25 MG tablet TAKE 1 TABLET(25 MG) BY MOUTH DAILY 90 tablet 3   hydrOXYzine  (VISTARIL ) 25 MG capsule 1  qday  prn 30 capsule 4   rosuvastatin  (CRESTOR ) 20 MG tablet Take 1 tablet (20 mg total) by mouth daily. 90 tablet 3   vitamin E 180 MG (400 UNITS) capsule Take 400 Units by mouth daily.     doxepin  (SINEQUAN ) 25 MG capsule Take 1 capsule (25 mg total) by mouth at bedtime. 30 capsule 6   sertraline  (ZOLOFT ) 100 MG tablet 1  qam 60 tablet 5   No current facility-administered medications for this visit.    Neurologic: Headache: No Seizure: No Paresthesias:No  Musculoskeletal: Strength & Muscle Tone: within normal limits Gait & Station: normal Patient leans: N/A  Psychiatric Specialty Exam: ROS  Blood pressure (!) 184/81, pulse 66, height 5' 1 (1.549 m), weight 155 lb (70.3 kg).Body mass index is 29.29 kg/m.  General Appearance: Casual  Eye Contact:  Fair  Speech:  Clear and Coherent  Volume:  Normal  Mood:  Depressed  Affect:  Blunt  Thought Process:  Goal Directed  Orientation:  Full (Time, Place, and Person)  Thought Content:  WDL  Suicidal Thoughts:  No  Homicidal Thoughts:  No  Memory:  NA  Judgement:  Good  Insight:  Good  Psychomotor Activity:  Normal  Concentration:    Recall:  Fair  Fund of Knowledge:Good  Language: Poor  Akathisia:  No  Handed:  Right  AIMS (if indicated):    Assets:  Desire for Improvement  ADL's:  Intact  Cognition: WNL  Sleep:      Treatment Plan Summary: 7/9/20254:13 PM   This patient's diagnosis is major depression in remission.  She continues taking Zoloft  100 mg.  Her second problem is insomnia.  She takes doxepin  on a as needed basis.  Overall she is functioning very well and return to see me in 4 months.  She will continue in one-to-one therapy.

## 2024-06-30 ENCOUNTER — Other Ambulatory Visit (HOSPITAL_COMMUNITY): Payer: Self-pay | Admitting: Psychiatry

## 2024-07-02 ENCOUNTER — Ambulatory Visit (INDEPENDENT_AMBULATORY_CARE_PROVIDER_SITE_OTHER): Admitting: Psychology

## 2024-07-02 DIAGNOSIS — F325 Major depressive disorder, single episode, in full remission: Secondary | ICD-10-CM

## 2024-07-02 DIAGNOSIS — F324 Major depressive disorder, single episode, in partial remission: Secondary | ICD-10-CM

## 2024-07-02 NOTE — Progress Notes (Signed)
 [facility] Counselor/Therapist Progress Note  Patient ID: SIMI BRIEL, MRN: [id number]   Date: 07/02/24  Time Spent: 11:06 am - 11:49 am : 43 Minutes  Treatment Type: Individual Therapy.  Reported Symptoms:  depression and anxiety.   Mental Status Exam: Appearance:  Casual     Behavior: Appropriate  Motor: Normal  Speech/Language:  Clear and Coherent  Affect: Congruent  Mood: dysthymic  Thought process: normal  Thought content:   WNL  Sensory/Perceptual disturbances:   WNL  Orientation: oriented to person, place, time/date, and situation  Attention: Good  Concentration: Good  Memory: WNL  Fund of knowledge:  Good  Insight:   Good  Judgment:  Good  Impulse Control: Good   Risk Assessment: Danger to Self:  No Self-injurious Behavior: No Danger to Others: No Duty to Warn:no Physical Aggression / Violence:No  Access to Firearms a concern: No  Gang Involvement:No   Subjective:   Mia Underwood participated from home, via video and consented to treatment. Therapist participated from office.  Mia Underwood reviewed the events of the past week. She reflected on a recent vacation and noted this being positive, overall. She is noted working on engaging  in enjoyable activities. Mia Underwood noted recently feeling more depressed due to an issue with her son and noted concern about his decision making. She noted missing her daughter who passed years back due to health issues. We worked on processing this during the session. Mia Underwood denied any particular trigger for her grief. Mia Underwood noted gaining weight and noted a need to exercise but noted not being motivated. We worked on exploring this. Mia Underwood noted previously walking in her parking lot and counting her steps. We worked on identifying additional forms of exercise including chair exercises and online workouts.   [name] and noted that her provider is aware of this and addressing this issue. We  worked on processing her grief. Mia Underwood was committed towards increasing her physical activity, which therapist praised. Mia Underwood was engaged and motivated during the session. Therapist validated Mia Underwood's feelings and experience and provided supportive therapy. A follow-up was scheduled for continued treatment, which she benefits from.   Interventions: CBT & grief.   Diagnosis:  Major depressive disorder with single episode, in partial remission [facility])  Psychiatric Treatment: Yes , Dr. Tasia. See Chart.    Treatment Plan:  Client Abilities/Strengths Mia Underwood is forthcoming and motivated for change.   Support System:  Family and friends.   Client Treatment Preferences OPT  Client Statement of Needs Mia Underwood would like to improve frustration tolerance, manage interpersonal stressors, reduce avoidance, engage socially, manage symptoms proactively, process past events, verbalize thoughts and feelings, build a positive supportive system, engage in enjoyable activities, increase mindfulness, manage anxiety regarding driving (other drivers).   Treatment Level Weekly  Symptoms  Anxiety: feeling anxious, difficulty managing worry, worrying too much about different things, difficulty relaxing, easily annoyed or irritable, feelings afraid something awful might happen.    (Status: maintained) Depression:  Feeling down, lethargy, poor appetite, difficulty concentrating, psychomotor retardation.   (Status: maintained)  Goals:   Mia Underwood experiences symptoms of depression.   Treatment plan signed and available on s-drive:  No, pending.    Target Date: 02/04/25 Frequency: Weekly  Progress: 0 Modality: individual    Therapist will provide referrals for additional resources as appropriate.  Therapist will provide psycho-education regarding Jandy's diagnosis and corresponding treatment approaches and interventions. Mia Mullet, LCSW will support the patient's ability to achieve  the goals identified.  will employ CBT, BA, Problem-solving, Solution Focused, Mindfulness,  coping skills, & other evidenced-based practices will be used to promote progress towards healthy functioning to help manage decrease symptoms associated with her diagnosis.   Reduce overall level, frequency, and intensity of the feelings of depression, anxiety and panic evidenced by decreased overall symptoms from 6 to 7 days/week to 0 to 1 days/week per client report for at least 3 consecutive months. Verbally express understanding of the relationship between feelings of depression, anxiety and their impact on thinking patterns and behaviors. Verbalize an understanding of the role that distorted thinking plays in creating fears, excessive worry, and ruminations.    [name] participated in the creation of the treatment plan)   Mia Mullet, LCSW

## 2024-07-24 ENCOUNTER — Ambulatory Visit (INDEPENDENT_AMBULATORY_CARE_PROVIDER_SITE_OTHER): Admitting: Psychology

## 2024-07-24 DIAGNOSIS — F324 Major depressive disorder, single episode, in partial remission: Secondary | ICD-10-CM | POA: Diagnosis not present

## 2024-07-24 NOTE — Progress Notes (Signed)
 [facility] Counselor/Therapist Progress Note  Patient ID: Mia Underwood, MRN: [id number]   Date: 07/24/24  Time Spent: 11:05 am - 11:48 am : 43 Minutes  Treatment Type: Individual Therapy.  Reported Symptoms:  depression and anxiety.   Mental Status Exam: Appearance:  Casual     Behavior: Appropriate  Motor: Normal  Speech/Language:  Clear and Coherent  Affect: Congruent  Mood: dysthymic  Thought process: normal  Thought content:   WNL  Sensory/Perceptual disturbances:   WNL  Orientation: oriented to person, place, time/date, and situation  Attention: Good  Concentration: Good  Memory: WNL  Fund of knowledge:  Good  Insight:   Good  Judgment:  Good  Impulse Control: Good   Risk Assessment: Danger to Self:  No Self-injurious Behavior: No Danger to Others: No Duty to Warn:no Physical Aggression / Violence:No  Access to Firearms a concern: No  Gang Involvement:No   Subjective:   Mia Underwood participated from home, via video and consented to treatment. Therapist participated from home office.  Mia Underwood reviewed the events of the past week. She noted significant frustration with difficulty navigating technology and noted this being a significant stressors for her. She noted additional stressors of navigating insurance issues, difficulties navigating bureaucracies, and unexpected changes to her monthly food allowances. We worked on processing her feelings during the session. We worked on processing her feelings during the session. We worked on exploring her coping skills during the session. She noted gardening and engaging in hobbies. She noted having poor social support due to stressful behaviors by others and needing to maintain boundaries and distance. We worked on identifying her needs and began exploring her attempts to address these needs.  We will work on identify ways to address her needs proactively. Mia Underwood noted the upcoming anniversary of her daughter's  death and noted this being quite painful. We continued to process her feelings of grief and loss during the session. Therapist validated Mia Underwood's feelings and experience and provided supportive therapy. A follow-up was scheduled for continued treatment, which she benefits from.   Interventions: CBT & grief.   Diagnosis:  Major depressive disorder with single episode, in partial remission [facility])  Psychiatric Treatment: Yes , Dr. Tasia. See Chart.    Treatment Plan:  Client Abilities/Strengths Mia Underwood is forthcoming and motivated for change.   Support System:  Family and friends.   Client Treatment Preferences OPT  Client Statement of Needs Mia Underwood would like to improve frustration tolerance, manage interpersonal stressors, reduce avoidance, engage socially, manage symptoms proactively, process past events, verbalize thoughts and feelings, build a positive supportive system, engage in enjoyable activities, increase mindfulness, manage anxiety regarding driving (other drivers).   Treatment Level Weekly  Symptoms  Anxiety: feeling anxious, difficulty managing worry, worrying too much about different things, difficulty relaxing, easily annoyed or irritable, feelings afraid something awful might happen.    (Status: maintained) Depression:  Feeling down, lethargy, poor appetite, difficulty concentrating, psychomotor retardation.   (Status: maintained)  Goals:   Mia Underwood experiences symptoms of depression.   Treatment plan signed and available on s-drive:  No, pending.    Target Date: 02/04/25 Frequency: Weekly  Progress: 0 Modality: individual    Therapist will provide referrals for additional resources as appropriate.  Therapist will provide psycho-education regarding Mia Underwood's diagnosis and corresponding treatment approaches and interventions. Mia Mullet, LCSW will support the patient's ability to achieve the goals identified. will employ CBT, BA, Problem-solving, Solution Focused,  Mindfulness,  coping skills, & other evidenced-based practices will  be used to promote progress towards healthy functioning to help manage decrease symptoms associated with her diagnosis.   Reduce overall level, frequency, and intensity of the feelings of depression, anxiety and panic evidenced by decreased overall symptoms from 6 to 7 days/week to 0 to 1 days/week per client report for at least 3 consecutive months. Verbally express understanding of the relationship between feelings of depression, anxiety and their impact on thinking patterns and behaviors. Verbalize an understanding of the role that distorted thinking plays in creating fears, excessive worry, and ruminations.    Mia Underwood participated in the creation of the treatment plan)   Mia Mullet, LCSW

## 2024-08-07 ENCOUNTER — Other Ambulatory Visit: Payer: Self-pay | Admitting: Internal Medicine

## 2024-08-07 DIAGNOSIS — I10 Essential (primary) hypertension: Secondary | ICD-10-CM

## 2024-08-07 DIAGNOSIS — Z1231 Encounter for screening mammogram for malignant neoplasm of breast: Secondary | ICD-10-CM

## 2024-08-07 DIAGNOSIS — R252 Cramp and spasm: Secondary | ICD-10-CM

## 2024-08-07 DIAGNOSIS — E7800 Pure hypercholesterolemia, unspecified: Secondary | ICD-10-CM

## 2024-08-07 MED ORDER — AMLODIPINE-OLMESARTAN 10-40 MG PO TABS: 1.0000 | ORAL_TABLET | Freq: Every day | ORAL | 0 refills | Status: DC

## 2024-08-07 MED ORDER — ROSUVASTATIN CALCIUM 20 MG PO TABS: 20.0000 mg | ORAL_TABLET | Freq: Every day | ORAL | 0 refills | Status: DC

## 2024-08-07 MED ORDER — HYDROCHLOROTHIAZIDE 25 MG PO TABS: 25.0000 mg | ORAL_TABLET | Freq: Every day | ORAL | 0 refills | Status: DC

## 2024-08-07 NOTE — Telephone Encounter (Signed)
 Copied from CRM #[id number]. Topic: Clinical - Medication Refill >> Aug 07, 2024  1:39 PM Cherylann S wrote: Medication:  rosuvastatin  (CRESTOR ) 20 MG tablet  amLODipine -olmesartan  (AZOR ) 10-40 MG  hydrochlorothiazide  (HYDRODIURIL ) 25 MG tablet  Has the patient contacted their pharmacy? No (Agent: If no, request that the patient contact the pharmacy for the refill. If patient does not wish to contact the pharmacy document the reason why and proceed with request.) (Agent: If yes, when and what did the pharmacy advise?)  This is the patient's preferred pharmacy:  [id number] [place], [place] - [address] AT [facility] & [address] [place] [address] Phone: [phone] Fax: [phone]  Is this the correct pharmacy for this prescription? Yes If no, delete pharmacy and type the correct one.   Has the prescription been filled recently? No  Is the patient out of the medication? Yes  Has the patient been seen for an appointment in the last year OR does the patient have an upcoming appointment? Yes  Can we respond through MyChart? No  Agent: Please be advised that Rx refills may take up to 3 business days. We ask that you follow-up with your pharmacy.

## 2024-08-15 ENCOUNTER — Ambulatory Visit: Admitting: Psychology

## 2024-08-19 ENCOUNTER — Ambulatory Visit: Payer: Self-pay | Admitting: Internal Medicine

## 2024-08-21 ENCOUNTER — Ambulatory Visit

## 2024-08-25 ENCOUNTER — Ambulatory Visit (INDEPENDENT_AMBULATORY_CARE_PROVIDER_SITE_OTHER): Admitting: Psychology

## 2024-08-25 DIAGNOSIS — F324 Major depressive disorder, single episode, in partial remission: Secondary | ICD-10-CM

## 2024-08-25 NOTE — Progress Notes (Signed)
  Behavioral Health Counselor/Therapist Progress Note  Patient ID: Mia Underwood, MRN: [id number]   Date: 08/25/24  Time Spent: 3:33 am -  4:31pm : 58 Minutes  Treatment Type: Individual Therapy.  Reported Symptoms:  depression and anxiety.   Mental Status Exam: Appearance:  Casual     Behavior: Appropriate  Motor: Normal  Speech/Language:  Clear and Coherent  Affect: Congruent  Mood: angry  Thought process: normal  Thought content:   WNL  Sensory/Perceptual disturbances:   WNL  Orientation: oriented to person, place, time/date, and situation  Attention: Good  Concentration: Good  Memory: WNL  Fund of knowledge:  Good  Insight:   Good  Judgment:  Good  Impulse Control: Good   Risk Assessment: Danger to Self:  No Self-injurious Behavior: No Danger to Others: No Duty to Warn:no Physical Aggression / Violence:No  Access to Firearms a concern: No  Gang Involvement:No   Subjective:   SHERL YZAGUIRRE participated from home, via video and consented to treatment. Therapist participated from office.  Ivan reviewed the events of the past week. She noted not resting as well as she would like due to a pending apartment inspection that never came to fruition. She noted additional stressors including her grand-daughter's announcement of having another child and noted her grand-daughter being careless and irresponsible. She noted confusion regarding her grand-daughter's behavior and not making more thoughtful decisions. Magalie noted her frustration regarding this and her carelessness. She noted her grand-daughter's past frustrating behavior and noted while loving her, I don't like her. She noted her granddaughter's consistent complaints about various issues and stressors but noted that she won't do anything about it. She noted that her own mother behaved this way, as well.SABRA Arland noted a need to maintain distance going forward. We reviewed her coping during this time and Denisse  noted her effort to read as a tool to focus elsewhere. We worked on identifying areas of control and lack of control, identifying and maintaining boundaries, and proactively managing her stress. Therapist validated Erryn's feelings and experience during the session. Therapist encouraged self-care and continued efforts towards managing boundaries. Yazleen was engaged and expressed commitment towards goals. Therapist provided supportive therapy and a follow-up was scheduled for continued treatment, which she benefits from.    Interventions: CBT & grief.   Diagnosis:  Major depressive disorder with single episode, in partial remission  Psychiatric Treatment: Yes , Dr. Tasia. See Chart.    Treatment Plan:  Client Abilities/Strengths Aylah is forthcoming and motivated for change.   Support System:  Family and friends.   Client Treatment Preferences OPT  Client Statement of Needs Julien would like to improve frustration tolerance, manage interpersonal stressors, reduce avoidance, engage socially, manage symptoms proactively, process past events, verbalize thoughts and feelings, build a positive supportive system, engage in enjoyable activities, increase mindfulness, manage anxiety regarding driving (other drivers).   Treatment Level Weekly  Symptoms  Anxiety: feeling anxious, difficulty managing worry, worrying too much about different things, difficulty relaxing, easily annoyed or irritable, feelings afraid something awful might happen.    (Status: maintained) Depression:  Feeling down, lethargy, poor appetite, difficulty concentrating, psychomotor retardation.   (Status: maintained)  Goals:   Dabney experiences symptoms of depression.   Treatment plan signed and available on s-drive:  No, pending.    Target Date: 02/04/25 Frequency: Weekly  Progress: 0 Modality: individual    Therapist will provide referrals for additional resources as appropriate.  Therapist will provide  psycho-education regarding Damilola's diagnosis and  corresponding treatment approaches and interventions. [name], LCSW will support the patient's ability to achieve the goals identified. will employ CBT, BA, Problem-solving, Solution Focused, Mindfulness,  coping skills, & other evidenced-based practices will be used to promote progress towards healthy functioning to help manage decrease symptoms associated with her diagnosis.   Reduce overall level, frequency, and intensity of the feelings of depression, anxiety and panic evidenced by decreased overall symptoms from 6 to 7 days/week to 0 to 1 days/week per client report for at least 3 consecutive months. Verbally express understanding of the relationship between feelings of depression, anxiety and their impact on thinking patterns and behaviors. Verbalize an understanding of the role that distorted thinking plays in creating fears, excessive worry, and ruminations.    [name] participated in the creation of the treatment plan)   [name], LCSW

## 2024-08-26 ENCOUNTER — Encounter: Payer: Self-pay | Admitting: Internal Medicine

## 2024-08-26 ENCOUNTER — Ambulatory Visit (INDEPENDENT_AMBULATORY_CARE_PROVIDER_SITE_OTHER): Payer: Self-pay | Admitting: Internal Medicine

## 2024-08-26 VITALS — BP 159/83 | HR 70 | Temp 98.4°F | Ht 61.0 in | Wt 158.2 lb

## 2024-08-26 DIAGNOSIS — F332 Major depressive disorder, recurrent severe without psychotic features: Secondary | ICD-10-CM

## 2024-08-26 DIAGNOSIS — E538 Deficiency of other specified B group vitamins: Secondary | ICD-10-CM | POA: Insufficient documentation

## 2024-08-26 DIAGNOSIS — R7303 Prediabetes: Secondary | ICD-10-CM

## 2024-08-26 DIAGNOSIS — Z23 Encounter for immunization: Secondary | ICD-10-CM | POA: Diagnosis not present

## 2024-08-26 DIAGNOSIS — I351 Nonrheumatic aortic (valve) insufficiency: Secondary | ICD-10-CM | POA: Diagnosis not present

## 2024-08-26 DIAGNOSIS — I10 Essential (primary) hypertension: Secondary | ICD-10-CM

## 2024-08-26 LAB — POCT GLYCOSYLATED HEMOGLOBIN (HGB A1C): HbA1c, POC (prediabetic range): 5.9 % (ref 5.7–6.4)

## 2024-08-26 LAB — GLUCOSE, CAPILLARY: Glucose-Capillary: 106 mg/dL — ABNORMAL HIGH (ref 70–99)

## 2024-08-26 NOTE — Addendum Note (Signed)
 Addended by: ANTONE DWAYNE SAILOR on: 08/26/2024 12:15 PM   Modules accepted: Orders

## 2024-08-26 NOTE — Assessment & Plan Note (Signed)
 PHQ9 stable at 0. Mia Underwood is doing well and continues to follow with psychiatry, Dr. Legrand. Remains on sertraline  daily, hydroxyzine  and doxepin  prn. Follows with behavioral counseling as well. Significant stressors from family which she is working through.  Plan -Continued f/u with Dr. Tasia, behavioral health

## 2024-08-26 NOTE — Patient Instructions (Signed)
 It was wonderful to see you today!  Please remember to take your medications daily. Keep an eye on your blood pressure at home and write the numbers down to bring to your next visit.   I will call you with lab results.  *For exercise classes (in-person and online), group activities, and more* (50+)            [email or web address]

## 2024-08-26 NOTE — Assessment & Plan Note (Signed)
 Borderline B12 levels on several checks since 2022. Prescribed 1000 mcg vitamin B12 by previous provider, brings in 50 mcg OTC tablet today. Recheck levels and increase dose as needed.

## 2024-08-26 NOTE — Assessment & Plan Note (Signed)
 Systolic, 3/6, murmur heard again today. Atypical chest pain when she is anxious and upset, otherwise denies. No significant dyspnea.

## 2024-08-26 NOTE — Assessment & Plan Note (Signed)
 Prediabetes with A1c of 5.9%, diagnosed July 2024. Continue healthy lifestyle measures including diet and exercise which were discussed today.  Plan -Annual A1c

## 2024-08-26 NOTE — Assessment & Plan Note (Signed)
 Blood pressure elevated during today's visit. She has not been adherent to medications daily. Within goal at our last visit in April with highs in between at behavioral health. We discussed the importance of daily adherence and home monitoring. No changes today.  Plan -Continue amlodipine -olmesartan  10-40 mg and hydrochlorothiazide  25 mg daily, stressed daily adherence -BMP

## 2024-08-26 NOTE — Progress Notes (Signed)
 Established Patient Office Visit  Subjective   Patient ID: Mia Underwood, female    DOB: [birth year]  Age: 73 y.o. MRN: [id number]  Chief Complaint  Patient presents with   Follow-up    Elevated BP.    Mia Underwood returns for follow-up of chronic medical conditions. She continues to have significant stress related to her family but follows with behavioral health for counseling and medication management. Please see assessment/plan in problem-based charting for further details of today's visit.   Patient Active Problem List   Diagnosis Date Noted   B12 deficiency 08/26/2024   Prediabetes 06/11/2023   Skin lesion 06/11/2023   Aortic atherosclerosis 04/02/2023   Mild aortic valve regurgitation 01/01/2023   Osteopenia 12/16/2019   Neuropathy of left lower extremity 09/03/2019   Leg cramping 09/09/2018   Allergic rhinitis 05/04/2016   Insomnia secondary to depression with anxiety 05/04/2016   Marijuana abuse 04/04/2016   Vitamin D  deficiency 04/04/2016   Current smoker on some days 01/17/2016   Bilateral carpal tunnel syndrome 10/18/2010   Diverticulosis of sigmoid colon 06/07/2010   Hyperlipidemia 09/14/2009   Moderately severe recurrent major depression (HCC) 09/14/2009   Essential hypertension 09/14/2009     Objective:     BP (!) 159/83 (BP Location: Left Arm, Patient Position: Sitting, Cuff Size: Large)   Pulse 70   Temp 98.4 F (36.9 C) (Oral)   Ht 5' 1 (1.549 m)   Wt 158 lb 3.2 oz (71.8 kg)   SpO2 97% Comment: RA  BMI 29.89 kg/m  BP Readings from Last 3 Encounters:  08/26/24 (!) 159/83  06/04/24 (!) 184/81  03/05/24 (!) 173/78   Wt Readings from Last 3 Encounters:  08/26/24 158 lb 3.2 oz (71.8 kg)  06/04/24 155 lb (70.3 kg)  03/05/24 157 lb (71.2 kg)   Physical Exam Constitutional:      General: She is not in acute distress.    Appearance: Normal appearance. She is not ill-appearing.  Cardiovascular:     Rate and Rhythm: Normal rate and regular  rhythm.  Pulmonary:     Effort: Pulmonary effort is normal.  Musculoskeletal:     Right lower leg: No edema.     Left lower leg: No edema.  Neurological:     General: No focal deficit present.     Mental Status: She is alert. Mental status is at baseline.  Psychiatric:        Behavior: Behavior normal.        Thought Content: Thought content normal.        Judgment: Judgment normal.     Comments: Full affect. Intermittently tearful when discussing her family.      Assessment & Plan:   Problem List Items Addressed This Visit       Cardiovascular and Mediastinum   Essential hypertension - Primary (Chronic)   Blood pressure elevated during today's visit. She has not been adherent to medications daily. Within goal at our last visit in April with highs in between at behavioral health. We discussed the importance of daily adherence and home monitoring. No changes today.  Plan -Continue amlodipine -olmesartan  10-40 mg and hydrochlorothiazide  25 mg daily, stressed daily adherence -BMP      Relevant Orders   Basic metabolic panel with GFR   Mild aortic valve regurgitation   Systolic, 3/6, murmur heard again today. Atypical chest pain when she is anxious and upset, otherwise denies. No significant dyspnea.        Other   Moderately  severe recurrent major depression (HCC)   PHQ9 stable at 0. Mia Underwood is doing well and continues to follow with psychiatry, Dr. Legrand. Remains on sertraline  daily, hydroxyzine  and doxepin  prn. Follows with behavioral counseling as well. Significant stressors from family which she is working through.  Plan -Continued f/u with Dr. Tasia, behavioral health       Prediabetes   Prediabetes with A1c of 5.9%, diagnosed July 2024. Continue healthy lifestyle measures including diet and exercise which were discussed today.  Plan -Annual A1c      Relevant Orders   POC Hbg A1C (Completed)   B12 deficiency   Borderline B12 levels on several checks  since 2022. Prescribed 1000 mcg vitamin B12 by previous provider, brings in 50 mcg OTC tablet today. Recheck levels and increase dose as needed.      Relevant Orders   Vitamin B12   Other Visit Diagnoses       Encounter for immunization       Relevant Orders   Flu vaccine HIGH DOSE PF(Fluzone Trivalent) (Completed)       Return in about 3 months (around 11/25/2024) for f/u BP.    Ronnald Sergeant, MD

## 2024-08-27 ENCOUNTER — Ambulatory Visit: Payer: Self-pay | Admitting: Internal Medicine

## 2024-08-27 DIAGNOSIS — E538 Deficiency of other specified B group vitamins: Secondary | ICD-10-CM

## 2024-08-27 LAB — BASIC METABOLIC PANEL WITH GFR
BUN/Creatinine Ratio: 26 (ref 12–28)
BUN: 15 mg/dL (ref 8–27)
CO2: 18 mmol/L — ABNORMAL LOW (ref 20–29)
Calcium: 9.6 mg/dL (ref 8.7–10.3)
Chloride: 107 mmol/L — ABNORMAL HIGH (ref 96–106)
Creatinine, Ser: 0.57 mg/dL (ref 0.57–1.00)
Glucose: 95 mg/dL (ref 70–99)
Potassium: 4.1 mmol/L (ref 3.5–5.2)
Sodium: 142 mmol/L (ref 134–144)
eGFR: 97 mL/min/1.73 (ref >59)

## 2024-08-27 LAB — VITAMIN B12: Vitamin B-12: 286 pg/mL (ref 232–1245)

## 2024-08-27 MED ORDER — VITAMIN B-12 1000 MCG PO TABS: 1000.0000 ug | ORAL_TABLET | Freq: Every day | ORAL | 3 refills | Status: AC

## 2024-08-27 NOTE — Progress Notes (Signed)
 Vitamin B12 still borderline on 50 mcg daily dose OTC. Recommend resuming higher supplementation, sent to patient pharmacy and discussed with patient 10/1.

## 2024-08-27 NOTE — Progress Notes (Signed)
 Reviewed with Ms. Gish 10/1.

## 2024-09-09 ENCOUNTER — Ambulatory Visit
Admission: RE | Admit: 2024-09-09 | Discharge: 2024-09-09 | Disposition: A | Source: Ambulatory Visit | Attending: Internal Medicine

## 2024-09-09 DIAGNOSIS — Z1231 Encounter for screening mammogram for malignant neoplasm of breast: Secondary | ICD-10-CM | POA: Diagnosis not present

## 2024-09-12 ENCOUNTER — Ambulatory Visit (INDEPENDENT_AMBULATORY_CARE_PROVIDER_SITE_OTHER): Admitting: Psychology

## 2024-09-12 DIAGNOSIS — F324 Major depressive disorder, single episode, in partial remission: Secondary | ICD-10-CM

## 2024-09-12 NOTE — Progress Notes (Signed)
 [facility] Counselor/Therapist Progress Note  Patient ID: Mia Underwood, MRN: [id number]   Date: 09/12/24  Time Spent: 2:05 pm -  2:57 pm : 52 Minutes  Treatment Type: Individual Therapy.  Reported Symptoms:  depression and anxiety.   Mental Status Exam: Appearance:  Casual     Behavior: Appropriate  Motor: Normal  Speech/Language:  Clear and Coherent  Affect: Congruent  Mood: dysthymic  Thought process: normal  Thought content:   WNL  Sensory/Perceptual disturbances:   WNL  Orientation: oriented to person, place, time/date, and situation  Attention: Good  Concentration: Good  Memory: WNL  Fund of knowledge:  Good  Insight:   Good  Judgment:  Good  Impulse Control: Good   Risk Assessment: Danger to Self:  No Self-injurious Behavior: No Danger to Others: No Duty to Warn:no Physical Aggression / Violence:No  Access to Firearms a concern: No  Gang Involvement:No   Subjective:   Mia Underwood participated from home, via video and consented to treatment. Therapist participated from home office.  Mia Underwood reviewed the events of the past week. She noted her continued effort to engage in consistent self-care including engaging in enjoyable activities such as gardening and cooking. She noted stress related to her family, particularly her grandchildren. She noted frustration regarding her grandson's partner and noted frustration specifically with her behavior. She note providing advice but noted her efforts to maintain boundaries after that point. She noted additional frustrations regarding her community members' behavior. She noted this behavior caused her frustration and noted this bothering her. We worked on identifying ways to address this issue. Additionally, we reviewed her coping skills. She noted declining a recent invite to spend time with a friend due to a lack of reciprocation. She noted her attempt to communicate her concern but noted a lack of engagement from  her friend. We worked on identifying other possible social supports, that would allow for socializing with those who will reciprocate. Mia Underwood was engaged and motivated during the session. She expressed commitment towards goals. Therapist praised Mia Underwood for her effort and energy, during the session, and provided supportive therapy. A follow-up was scheduled for continued treatment which she benefits from.   Interventions: CBT & interpersonal.   Diagnosis:  Major depressive disorder with single episode, in partial remission  Psychiatric Treatment: Yes , Dr. Tasia. See Chart.    Treatment Plan:  Client Abilities/Strengths Mia Underwood is forthcoming and motivated for change.   Support System:  Family and friends.   Client Treatment Preferences OPT  Client Statement of Needs Mia Underwood would like to improve frustration tolerance, manage interpersonal stressors, reduce avoidance, engage socially, manage symptoms proactively, process past events, verbalize thoughts and feelings, build a positive supportive system, engage in enjoyable activities, increase mindfulness, manage anxiety regarding driving (other drivers).   Treatment Level Weekly  Symptoms  Anxiety: feeling anxious, difficulty managing worry, worrying too much about different things, difficulty relaxing, easily annoyed or irritable, feelings afraid something awful might happen.    (Status: maintained) Depression:  Feeling down, lethargy, poor appetite, difficulty concentrating, psychomotor retardation.   (Status: maintained)  Goals:   Mia Underwood experiences symptoms of depression.   Treatment plan signed and available on s-drive:  No, pending.    Target Date: 02/04/25 Frequency: Weekly  Progress: 0 Modality: individual    Therapist will provide referrals for additional resources as appropriate.  Therapist will provide psycho-education regarding Mia Underwood's diagnosis and corresponding treatment approaches and interventions. Mia Mullet,  Mia Underwood will support the patient's ability to  achieve the goals identified. will employ CBT, BA, Problem-solving, Solution Focused, Mindfulness,  coping skills, & other evidenced-based practices will be used to promote progress towards healthy functioning to help manage decrease symptoms associated with her diagnosis.   Reduce overall level, frequency, and intensity of the feelings of depression, anxiety and panic evidenced by decreased overall symptoms from 6 to 7 days/week to 0 to 1 days/week per client report for at least 3 consecutive months. Verbally express understanding of the relationship between feelings of depression, anxiety and their impact on thinking patterns and behaviors. Verbalize an understanding of the role that distorted thinking plays in creating fears, excessive worry, and ruminations.    Mia Underwood participated in the creation of the treatment plan)   Mia Mullet, Mia Underwood

## 2024-10-08 ENCOUNTER — Ambulatory Visit (HOSPITAL_BASED_OUTPATIENT_CLINIC_OR_DEPARTMENT_OTHER): Admitting: Psychiatry

## 2024-10-08 ENCOUNTER — Other Ambulatory Visit: Payer: Self-pay

## 2024-10-08 VITALS — BP 190/79 | HR 75 | Ht 61.0 in | Wt 161.0 lb

## 2024-10-08 DIAGNOSIS — F325 Major depressive disorder, single episode, in full remission: Secondary | ICD-10-CM

## 2024-10-08 MED ORDER — DOXEPIN HCL 25 MG PO CAPS: 25.0000 mg | ORAL_CAPSULE | Freq: Every day | ORAL | 6 refills | Status: AC

## 2024-10-08 MED ORDER — SERTRALINE HCL 100 MG PO TABS: ORAL_TABLET | ORAL | 8 refills | Status: AC

## 2024-10-08 NOTE — Progress Notes (Signed)
 Psychiatric Initial Adult Assessment   Patient Identification: Mia Underwood MRN:  [id number] Date of Evaluation:  10/08/2024 Referral Source: Landry McKinzie Chief Complaint: Clinical dg along well as best she can. Virtual Visit via Telephone Note     Today the patient is seen in the office.  The patient is doing very well.  She just became a great grandmother.  She has a great granddaughter just born in the last month her name isNiaya.  The patient is functioning very well.  Her mood is good.  She likes to read.  She loves her plants and flowers.  Her finances are good.  She has pretty good relationships with her kids.  Her daughter had died years ago but she still has a son.  The patient denies daily depression.  She is sleeping and eating very well.  Patient knows to take life 1 day at a time.  She is very spiritual.  She lives alone and functions very well.  She says the doxepin  does help her sleep a great deal.   I connected with ASHELYN MCCRAVY on 10/08/24 at  1:00 PM EST by telephone and verified that I am speaking with the correct person using two identifiers.  Location: Patient: home Provider: office   I discussed the limitations, risks, security and privacy concerns of performing an evaluation and management service by telephone and the availability of in person appointments. I also discussed with the patient that there may be a patient responsible charge related to this service. The patient expressed understanding and agreed to proceed.  ructions:    I discussed the assessment and treatment plan with the patient. The patient was provided an opportunity to ask questions and all were answered. The patient agreed with the plan and demonstrated an understanding of the instructions.   The patient was advised to call back or seek an in-person evaluation if the symptoms worsen or if the condition fails to improve as anticipated.  I provided 15 minutes of non-face-to-face time during  this encounter.   Elna LILLETTE Lo, MD  Past Psychiatric History:   Previous Psychotropic Medications: Yes   Substance Abuse History in the last 12 months:  Yes.    Consequences of Substance Abuse:   Past Medical History:  Past Medical History:  Diagnosis Date   Allergic rhinitis 05/04/2016   Arthritis    Bilateral carpal tunnel syndrome 10/18/2010   Left > Right    Colles' fracture of left radius 07/01/2019   Diverticulosis of sigmoid colon 06/07/2010   Essential hypertension 09/14/2009   Healthcare maintenance 01/03/2016   Hyperlipidemia 09/14/2009   Insomnia secondary to depression with anxiety 05/04/2016   Major depression, recurrent, chronic 09/14/2009   Marijuana abuse 04/04/2016   Muscle spasm of left shoulder 02/03/2019   Tobacco abuse 01/17/2016   Upper respiratory infection, viral 02/03/2019   Vitamin D  deficiency 04/04/2016    Past Surgical History:  Procedure Laterality Date   ABDOMINAL HYSTERECTOMY  April 1996   For fibroids   COLONOSCOPY  07/18/2010   Patterson-tic's    Family Psychiatric History:   Family History:  Family History  Problem Relation Age of Onset   Breast cancer Mother    Depression Mother    Hypertension Mother    Dementia Mother    Alcoholism Father        Died in his 11/04/2024 related to alcohol abuse   Leukemia Sister 8   Early death Brother        Died  in a motor vehicle accident   Heart attack Daughter 50       Per patient report, specifics unknown   Diabetes Mellitus II Maternal Grandmother    Diabetes Mellitus II Paternal Grandmother    Healthy Sister    Healthy Sister    Arthritis Brother    Colon polyps Brother    Healthy Brother    Healthy Brother    Healthy Son    Breast cancer Maternal Aunt    Breast cancer Cousin    Breast cancer Maternal Aunt    Colon cancer Neg Hx    Esophageal cancer Neg Hx    Rectal cancer Neg Hx    Stomach cancer Neg Hx     Social History:   Social History   Socioeconomic History    Marital status: Single    Spouse name: Not on file   Number of children: 2   Years of education: 12 grade   Highest education level: Not on file  Occupational History   Occupation: Research Officer, Political Party:  [employer]    Comment: Retired  Tobacco Use   Smoking status: Some Days    Current packs/day: 0.00    Average packs/day: 0.2 packs/day for 48.7 years (9.7 ttl pk-yrs)    Types: Cigarettes    Start date: 12/02/1973    Last attempt to quit: 07/2022    Years since quitting: 2.2   Smokeless tobacco: Never  Vaping Use   Vaping status: Never Used  Substance and Sexual Activity   Alcohol use: Yes    Alcohol/week: 2.0 standard drinks of alcohol    Types: 1 Glasses of wine, 1 Cans of beer per week    Comment: drinks socially with friends   Drug use: No   Sexual activity: Not Currently  Other Topics Concern   Not on file  Social History Narrative   Graduated from [employer] in [place].  Retired designer, jewellery.  Lives alone.  Divorced X 2.      Current Social History 03/01/2021   Patient lives with granddaughter in an apartment on the first floor. There are not steps up to the entrance the patient uses.       Patient's method of transportation is personal car.      The highest level of education was some college.      The patient currently retired.      Identified important Relationships are My brother, my son       Pets : None, but I would like to have one       Interests / Fun: Love to read, play copywriter, advertising, control and instrumentation engineer with sisters       Current Stressors: My community is getting violent, strained family relationships      Religious / Personal Beliefs: Baptized [employer]       Social Drivers of Corporate Investment Banker Strain: Low Risk  (03/05/2024)   Overall Financial Resource Strain (CARDIA)    Difficulty of Paying Living Expenses: Not hard at all  Food Insecurity: No Food Insecurity (03/05/2024)   Hunger Vital Sign    Worried About  Running Out of Food in the Last Year: Never true    Ran Out of Food in the Last Year: Never true  Transportation Needs: No Transportation Needs (03/05/2024)   PRAPARE - Administrator, Civil Service (Medical): No    Lack of Transportation (Non-Medical): No  Physical Activity: Insufficiently Active (03/05/2024)  Exercise Vital Sign    Days of Exercise per Week: 2 days    Minutes of Exercise per Session: 10 min  Stress: No Stress Concern Present (03/05/2024)   [employer] of Occupational Health - Occupational Stress Questionnaire    Feeling of Stress : Not at all  Social Connections: Moderately Integrated (03/05/2024)   Social Connection and Isolation Panel    Frequency of Communication with Friends and Family: More than three times a week    Frequency of Social Gatherings with Friends and Family: Once a week    Attends Religious Services: More than 4 times per year    Active Member of [employer] or Organizations: Yes    Attends Banker Meetings: More than 4 times per year    Marital Status: Widowed    Additional Social History:   Allergies:   Allergies  Allergen Reactions   Penicillins Hives   Naproxen Rash    Pt states she takes Aleve without problems    Metabolic Disorder Labs: Lab Results  Component Value Date   HGBA1C 5.9 08/26/2024   No results found for: PROLACTIN Lab Results  Component Value Date   CHOL 168 02/26/2024   TRIG 137 02/26/2024   HDL 50 02/26/2024   CHOLHDL 3.4 02/26/2024   VLDL 20 11/01/2010   LDLCALC 94 02/26/2024   LDLCALC 96 04/04/2022     Current Medications: Current Outpatient Medications  Medication Sig Dispense Refill   amLODipine -olmesartan  (AZOR ) 10-40 MG tablet Take 1 tablet by mouth daily. 90 tablet 0   cetirizine  (ZYRTEC ) 10 MG tablet TAKE 1 TABLET(10 MG) BY MOUTH DAILY 90 tablet 3   Cholecalciferol  (VITAMIN D3) 10 MCG (400 UNIT) tablet Take 400 Units by mouth daily.     cyanocobalamin  (VITAMIN B12) 1000 MCG  tablet Take 1 tablet (1,000 mcg total) by mouth daily. 90 tablet 3   diclofenac  Sodium (VOLTAREN ) 1 % GEL Apply 4 g topically 4 (four) times daily. 150 g 3   gabapentin  (NEURONTIN ) 600 MG tablet TAKE 1 TABLET(600 MG) BY MOUTH DAILY 90 tablet 3   hydrochlorothiazide  (HYDRODIURIL ) 25 MG tablet Take 1 tablet (25 mg total) by mouth daily. 90 tablet 0   hydrOXYzine  (VISTARIL ) 25 MG capsule 1  qday  prn 30 capsule 4   rosuvastatin  (CRESTOR ) 20 MG tablet Take 1 tablet (20 mg total) by mouth daily. 90 tablet 0   vitamin E 45 MG (100 UNITS) capsule Take 100 Units by mouth daily.     doxepin  (SINEQUAN ) 25 MG capsule Take 1 capsule (25 mg total) by mouth at bedtime. 30 capsule 6   sertraline  (ZOLOFT ) 100 MG tablet 1  qam 60 tablet 8   No current facility-administered medications for this visit.    Neurologic: Headache: No Seizure: No Paresthesias:No  Musculoskeletal: Strength & Muscle Tone: within normal limits Gait & Station: normal Patient leans: N/A  Psychiatric Specialty Exam: ROS  Blood pressure (!) 190/79, pulse 75, height 5' 1 (1.549 m), weight 161 lb (73 kg).Body mass index is 30.42 kg/m.  General Appearance: Casual  Eye Contact:  Fair  Speech:  Clear and Coherent  Volume:  Normal  Mood:  Depressed  Affect:  Blunt  Thought Process:  Goal Directed  Orientation:  Full (Time, Place, and Person)  Thought Content:  WDL  Suicidal Thoughts:  No  Homicidal Thoughts:  No  Memory:  NA  Judgement:  Good  Insight:  Good  Psychomotor Activity:  Normal  Concentration:    Recall:  Fair  Fund of Knowledge:Good  Language: Poor  Akathisia:  No  Handed:  Right  AIMS (if indicated):    Assets:  Desire for Improvement  ADL's:  Intact  Cognition: WNL  Sleep:      Treatment Plan Summary: 11/12/20251:32 PM    This patient's diagnosis is major depression.  She continues taking Zoloft  100 mg.  Her second problem is insomnia.  She takes doxepin  which is very effective for her.  Patient  is functioning very well.  She will return to see me in 6 months.

## 2024-10-17 ENCOUNTER — Ambulatory Visit: Admitting: Psychology

## 2024-10-17 DIAGNOSIS — F325 Major depressive disorder, single episode, in full remission: Secondary | ICD-10-CM

## 2024-10-17 NOTE — Progress Notes (Signed)
 [facility] Counselor/Therapist Progress Note  Patient ID: Mia Underwood, MRN: [id number]   Date: 10/17/24  Time Spent: 2:14 pm -  2:57 pm : 43 Minutes  Treatment Type: Individual Therapy.  Reported Symptoms:  depression and anxiety.   Mental Status Exam: Appearance:  Casual     Behavior: Appropriate  Motor: Normal  Speech/Language:  Clear and Coherent  Affect: Congruent  Mood: dysthymic  Thought process: normal  Thought content:   WNL  Sensory/Perceptual disturbances:   WNL  Orientation: oriented to person, place, time/date, and situation  Attention: Good  Concentration: Good  Memory: WNL  Fund of knowledge:  Good  Insight:   Good  Judgment:  Good  Impulse Control: Good   Risk Assessment: Danger to Self:  No Self-injurious Behavior: No Danger to Others: No Duty to Warn:no Physical Aggression / Violence:No  Access to Firearms a concern: No  Gang Involvement:No   Subjective:   JOLANDA MCCANN participated from home, via video and consented to treatment. Therapist participated from home office.  Bana reviewed the events of the past week. She noted recent rising frustration regarding technology and noted the struggles she experiences in this area. She noted the recent news of her eldest grandchild becoming pregnant and Zully noted her feelings related to this announcement. She noted her grand-daughter not receiving feedback well, regarding this decision. She noted frustration regarding this decision and noted the possible implication on others. She noted her granddaughter's general over-reliance on others in regards to parenting. She noted her grand-daughter's frequent demands money and child care with her first two children. Azrael noted nobody is going to force something on me. She noted her grand daughter not being invested in others while expecting others to be invested in her. She noted difficulty making sense of her granddaughter's behavior and decision  making. She noted her grand daughter's consistent poor decision-making and that she (grand daughter) has no respect for one of us . She noted that she loves her grand daughter but does not like her. We worked on identifying areas of control and lack of control in regards to her grand-daughter's behavior and worked on identifying boundaries for self and others and reducing her rumination.Britanee noted her intent to manage her rumination, maintain her boundaries, and disengage from her grand-daughter's antagonism. Therapist praised Darice for her effort and provided supportive therapy. A follow-up was scheduled for continued treatment, which she benefits from.   Interventions: CBT & interpersonal.   Diagnosis:  Major depressive disorder in full remission, unspecified whether recurrent  Psychiatric Treatment: Yes , Dr. Tasia. See Chart.    Treatment Plan:  Client Abilities/Strengths Marialuisa is forthcoming and motivated for change.   Support System:  Family and friends.   Client Treatment Preferences OPT  Client Statement of Needs Jenice would like to improve frustration tolerance, manage interpersonal stressors, reduce avoidance, engage socially, manage symptoms proactively, process past events, verbalize thoughts and feelings, build a positive supportive system, engage in enjoyable activities, increase mindfulness, manage anxiety regarding driving (other drivers).   Treatment Level Weekly  Symptoms  Anxiety: feeling anxious, difficulty managing worry, worrying too much about different things, difficulty relaxing, easily annoyed or irritable, feelings afraid something awful might happen.    (Status: maintained) Depression:  Feeling down, lethargy, poor appetite, difficulty concentrating, psychomotor retardation.   (Status: maintained)  Goals:   Terris experiences symptoms of depression.   Treatment plan signed and available on s-drive:  No, pending.    Target Date: 02/04/25  Frequency:  Weekly  Progress: 0 Modality: individual    Therapist will provide referrals for additional resources as appropriate.  Therapist will provide psycho-education regarding Aryah's diagnosis and corresponding treatment approaches and interventions. [name], LCSW will support the patient's ability to achieve the goals identified. will employ CBT, BA, Problem-solving, Solution Focused, Mindfulness,  coping skills, & other evidenced-based practices will be used to promote progress towards healthy functioning to help manage decrease symptoms associated with her diagnosis.   Reduce overall level, frequency, and intensity of the feelings of depression, anxiety and panic evidenced by decreased overall symptoms from 6 to 7 days/week to 0 to 1 days/week per client report for at least 3 consecutive months. Verbally express understanding of the relationship between feelings of depression, anxiety and their impact on thinking patterns and behaviors. Verbalize an understanding of the role that distorted thinking plays in creating fears, excessive worry, and ruminations.    [name] participated in the creation of the treatment plan)   [name], LCSW

## 2024-10-23 DIAGNOSIS — Z833 Family history of diabetes mellitus: Secondary | ICD-10-CM | POA: Diagnosis not present

## 2024-10-23 DIAGNOSIS — I10 Essential (primary) hypertension: Secondary | ICD-10-CM | POA: Diagnosis not present

## 2024-10-23 DIAGNOSIS — G629 Polyneuropathy, unspecified: Secondary | ICD-10-CM | POA: Diagnosis not present

## 2024-10-23 DIAGNOSIS — F325 Major depressive disorder, single episode, in full remission: Secondary | ICD-10-CM | POA: Diagnosis not present

## 2024-10-23 DIAGNOSIS — M8580 Other specified disorders of bone density and structure, unspecified site: Secondary | ICD-10-CM | POA: Diagnosis not present

## 2024-10-23 DIAGNOSIS — I700 Atherosclerosis of aorta: Secondary | ICD-10-CM | POA: Diagnosis not present

## 2024-10-23 DIAGNOSIS — F419 Anxiety disorder, unspecified: Secondary | ICD-10-CM | POA: Diagnosis not present

## 2024-10-23 DIAGNOSIS — Z8249 Family history of ischemic heart disease and other diseases of the circulatory system: Secondary | ICD-10-CM | POA: Diagnosis not present

## 2024-10-23 DIAGNOSIS — E785 Hyperlipidemia, unspecified: Secondary | ICD-10-CM | POA: Diagnosis not present

## 2024-11-14 ENCOUNTER — Ambulatory Visit: Admitting: Psychology

## 2024-12-15 ENCOUNTER — Ambulatory Visit: Admitting: Psychology

## 2024-12-15 DIAGNOSIS — F325 Major depressive disorder, single episode, in full remission: Secondary | ICD-10-CM

## 2024-12-15 NOTE — Progress Notes (Signed)
 [facility] Counselor/Therapist Progress Note  Patient ID: NIEVES CHAPA, MRN: [id number]   Date: 12/15/24  Time Spent: 12:23 pm -  1:08 pm : 44 Minutes  Treatment Type: Individual Therapy.  Reported Symptoms:  depression and anxiety.   Mental Status Exam: Appearance:  Casual     Behavior: Appropriate  Motor: Normal  Speech/Language:  Clear and Coherent  Affect: Congruent  Mood: dysthymic  Thought process: normal  Thought content:   WNL  Sensory/Perceptual disturbances:   WNL  Orientation: oriented to person, place, time/date, and situation  Attention: Good  Concentration: Good  Memory: WNL  Fund of knowledge:  Good  Insight:   Good  Judgment:  Good  Impulse Control: Good   Risk Assessment: Danger to Self:  No Self-injurious Behavior: No Danger to Others: No Duty to Warn:no Physical Aggression / Violence:No  Access to Firearms a concern: No  Gang Involvement:No   Subjective:   PEARLE WANDLER participated from home, via video and consented to treatment. Therapist participated from home office.  Zakariah reviewed the events of the past week. Krystin noted recent stressors including her computer getting hacked. She noted this being quite stressful for her. She noted having to take various steps to address this issue and the stressors related to this. She noted confusion about how her devices were hacked and noted the lack of support from others to address this issue. She noted her decision to discard her other devices and to maintain use of her phone only, due to her frustration. She noted her preference, going forward, to have her sessions in person verses via teleconferencing. She noted her effort to build boundaries with her grandchildren in relation to not baby sitting. She noted her interest in spending time with her grand children for short durations of time.  She noted interest in companionship but noted not being hopeful of this, at this time. She noted  that those who she's recently met do not appear to have similar interests. She noted that they are looking for a girlfriend or wife. We worked on exploring what type of relationship she is interested in. She noted her interest in joint goals, time together, engaging in activities that both have interest in, and having a relaxed non-pressure filled interactions. She noted a recent ex-partner reaching out and expressing his interest to rekindle the relationship. She noted noticing specific expectations from her friend that do not align with her interests. We worked on exploring this during the session. She noted her effort to communicate boundaries and noted this being eschewed. She noted on maintaining her boundaries despite people's response including efforts to ignore her boundaries. Therapist praised Kaelin for her effort to set boundaries with family, and others, despite the feedback. She noted being mindful of people's lack of respect of her boundaries and disengaging from those who are not respectful of them. Therapist encouraged self-care and continued effort towards maintaining her boundaries. Lotoya was engaged and motivated during the session and expressed commitment towards goals. Therapist praised Charmeka for her effort during the session and provided supportive therapy. Mera was scheduled for continued treatment, which she benefits from.    Interventions: CBT & interpersonal.   Diagnosis:  Major depressive disorder in full remission, unspecified whether recurrent  Psychiatric Treatment: Yes , Dr. Tasia. See Chart.    Treatment Plan:  Client Abilities/Strengths Yashica is forthcoming and motivated for change.   Support System:  Family and friends.   Client Treatment Preferences OPT  Client Statement  of Needs Znya would like to improve frustration tolerance, manage interpersonal stressors, reduce avoidance, engage socially, manage symptoms proactively, process past events, verbalize  thoughts and feelings, build a positive supportive system, engage in enjoyable activities, increase mindfulness, manage anxiety regarding driving (other drivers).   Treatment Level Weekly  Symptoms  Anxiety: feeling anxious, difficulty managing worry, worrying too much about different things, difficulty relaxing, easily annoyed or irritable, feelings afraid something awful might happen.    (Status: maintained) Depression:  Feeling down, lethargy, poor appetite, difficulty concentrating, psychomotor retardation.   (Status: maintained)  Goals:   Brithany experiences symptoms of depression.   Treatment plan signed and available on s-drive:  No, pending.    Target Date: 02/04/25 Frequency: Weekly  Progress: 10% Modality: individual    Therapist will provide referrals for additional resources as appropriate.  Therapist will provide psycho-education regarding Thomas's diagnosis and corresponding treatment approaches and interventions. [name], LCSW will support the patient's ability to achieve the goals identified. will employ CBT, BA, Problem-solving, Solution Focused, Mindfulness,  coping skills, & other evidenced-based practices will be used to promote progress towards healthy functioning to help manage decrease symptoms associated with her diagnosis.   Reduce overall level, frequency, and intensity of the feelings of depression, anxiety and panic evidenced by decreased overall symptoms from 6 to 7 days/week to 0 to 1 days/week per client report for at least 3 consecutive months. Verbally express understanding of the relationship between feelings of depression, anxiety and their impact on thinking patterns and behaviors. Verbalize an understanding of the role that distorted thinking plays in creating fears, excessive worry, and ruminations.    [name] participated in the creation of the treatment plan)   [name], LCSW

## 2024-12-23 ENCOUNTER — Telehealth (HOSPITAL_COMMUNITY): Payer: Self-pay

## 2024-12-23 NOTE — Telephone Encounter (Signed)
 Fax received from patients pharmacy for a refill on Doxepin , patient got a prescription on 11/12 with 6 refills. I called the pharmacy to verify that they did have the prescription. They verified that they have it on file for her

## 2024-12-31 ENCOUNTER — Other Ambulatory Visit: Payer: Self-pay | Admitting: *Deleted

## 2024-12-31 DIAGNOSIS — E7800 Pure hypercholesterolemia, unspecified: Secondary | ICD-10-CM

## 2024-12-31 DIAGNOSIS — R252 Cramp and spasm: Secondary | ICD-10-CM

## 2024-12-31 DIAGNOSIS — I10 Essential (primary) hypertension: Secondary | ICD-10-CM

## 2024-12-31 NOTE — Telephone Encounter (Signed)
 Copied from CRM [id number]. Topic: Clinical - Medication Refill >> Dec 31, 2024  3:41 PM Chiquita SQUIBB wrote: Medication: amLODipine -olmesartan  amLODipine -olmesartan  (AZOR ) 10-40 MG tablet    hydrochlorothiazide  hydrochlorothiazide  (HYDRODIURIL ) 25 MG tablet   sertraline  sertraline  (ZOLOFT ) 100 MG tablet   rosuvastatin  rosuvastatin  (CRESTOR ) 20 MG tablet   gabapentin  gabapentin  (NEURONTIN ) 600 MG tablet   Has the patient contacted their pharmacy? Yes- Pharmacy calling in for patient (Agent: If no, request that the patient contact the pharmacy for the refill. If patient does not wish to contact the pharmacy document the reason why and proceed with request.) (Agent: If yes, when and what did the pharmacy advise?)  This is the patient's preferred pharmacy:   [facility] - [place], [place] - [address] [place] [address] Phone: [phone] Fax: [phone]  Is this the correct pharmacy for this prescription? Yes If no, delete pharmacy and type the correct one.   Has the prescription been filled recently? No  Is the patient out of the medication? No  Has the patient been seen for an appointment in the last year OR does the patient have an upcoming appointment? Yes  Can we respond through MyChart? No  Agent: Please be advised that Rx refills may take up to 3 business days. We ask that you follow-up with your pharmacy.

## 2024-12-31 NOTE — Telephone Encounter (Signed)
"  LOV: 08/26/2024  "

## 2025-01-01 MED ORDER — ROSUVASTATIN CALCIUM 20 MG PO TABS: 20.0000 mg | ORAL_TABLET | Freq: Every day | ORAL | 0 refills | Status: AC

## 2025-01-01 MED ORDER — AMLODIPINE-OLMESARTAN 10-40 MG PO TABS: 1.0000 | ORAL_TABLET | Freq: Every day | ORAL | 0 refills | Status: AC

## 2025-01-01 MED ORDER — HYDROCHLOROTHIAZIDE 25 MG PO TABS: 25.0000 mg | ORAL_TABLET | Freq: Every day | ORAL | 0 refills | Status: AC

## 2025-01-01 MED ORDER — GABAPENTIN 600 MG PO TABS: 600.0000 mg | ORAL_TABLET | Freq: Every day | ORAL | 0 refills | Status: AC

## 2025-01-01 NOTE — Telephone Encounter (Signed)
 Called pt - informed to call Dr Tasia for Sertraline  refill. Pt stated she will schedule her next appt in March (made her aware it will not be with Dr Karna); she prefers a female. Appt schedule w/Dr Benuel 01/30/25.

## 2025-01-05 ENCOUNTER — Ambulatory Visit: Admitting: Psychology

## 2025-01-30 ENCOUNTER — Ambulatory Visit: Payer: Self-pay

## 2025-04-07 ENCOUNTER — Ambulatory Visit (HOSPITAL_COMMUNITY): Admitting: Psychiatry
